# Patient Record
Sex: Male | Born: 1940
Health system: Southern US, Community
[De-identification: ages and names within clinical notes are randomized; demographics above are authoritative.]

## PROBLEM LIST (undated history)

## (undated) DIAGNOSIS — I10 Essential (primary) hypertension: Secondary | ICD-10-CM

## (undated) DIAGNOSIS — I4891 Unspecified atrial fibrillation: Secondary | ICD-10-CM

## (undated) DIAGNOSIS — E119 Type 2 diabetes mellitus without complications: Secondary | ICD-10-CM

## (undated) HISTORY — PX: KIDNEY STONE SURGERY: SHX686

## (undated) HISTORY — PX: HERNIA REPAIR: SHX51

---

## 2007-08-02 ENCOUNTER — Ambulatory Visit (HOSPITAL_BASED_OUTPATIENT_CLINIC_OR_DEPARTMENT_OTHER): Admission: RE | Admit: 2007-08-02 | Discharge: 2007-08-03 | Payer: Self-pay | Admitting: Orthopedic Surgery

## 2010-08-27 NOTE — Op Note (Signed)
Jason, [name] NO.:  [id number]   MEDICAL RECORD NO.:  [id number]          PATIENT TYPE:  AMB   LOCATION:  DSC                          FACILITY:  [facility]   PHYSICIAN:  Loreta Ave, M.D. DATE OF BIRTH:  1940-04-22   DATE OF PROCEDURE:  08/02/2007  DATE OF DISCHARGE:                               OPERATIVE REPORT   PREOPERATIVE DIAGNOSIS:  Displaced trimalleolar ankle fracture, right.   POSTOPERATIVE DIAGNOSES:  1. Displaced trimalleolar ankle fracture, right.  2. Disruption of syndesmosis distal tib-fib joint.   PROCEDURES:  1. Open reduction and internal fixation of trimalleolar ankle fracture      with plate fixation laterally with a Synthes locking plate.  2. Screw, anchor, and suture fixation medially.  3. Closed treatment of posterior fracture.  4. Repair of syndesmosis with TightRope device.   SURGEON:  Loreta Ave, MD   ASSISTANT:  Genene Churn. Barry Dienes, PA   ANESTHESIA:  General.   BLOOD LOSS:  Minimal.   TOURNIQUET TIME:  1 hour.   SPECIMENS:  None.   CONSULTS:  None.   COMPLICATIONS:  None.   DRESSINGS:  Sterile compressive with short leg splint.   PROCEDURE:  The patient brought to the operating room and placed on the  operating table in supine position.  After adequate anesthesia had been  obtained, tourniquet applied to the upper aspect of the right leg.  Leg  examined.  The fracture blisters much improved.  Under sterile  technique, these were drained with a sterile needle just to get the  remaining fluid out.  Ankle was then examined with fluoroscopic  technique.  Comparison view of the opposite side.  Obvious trimalleolar  fracture with small fractures medially.  Syndesmosis was more disrupted  than I anticipated based on a fibular fracture pattern.  After  establishing what needed to be done, prepped and draped in the usual  sterile fashion.  Exsanguinated with elevation and Esmarch.  Tourniquet  inflated to 350 mmHg.   Incision laterally.  Subperiosteal exposure of  the fibula.  Reduced anatomically.  Fixed with a 6-hole plate, and it  was placed posterolaterally.  One of the central screws above the  fracture was left open for a TightRope device for the distal tib-fib  repair.  Nice anatomic reduction of the lateral side.  Despite the  fibula being fixed, there was still some gapping at the distal tib-fib  joint, although improved from where it was before.  Attention turned  medially, where a longitudinal incision heading distal and posterior was  used.  Skin and subcutaneous tissue divided.  Most of the medial  malleolus was fragmented just to avulse off the base.  Some of these  excised, some of them left in place in the deltoid ligament, which was  planned to repair to the medial malleolus.  As the medial malleolus was  exposed, the whole front half was actually broken off in a relatively  large fragment.  It was not evident on preop films.  This was first  fixed anatomically with a guidewire and then with  a cannulated 4.0  screw.  I then put a bioabsorbable anchor up into the medial malleolus  and used a FiberWire to weave down to the deltoid ligament.  This was  then reduced and firmly tied, repairing the entire medial side.  On the  lateral side, through the central hole of the plate, a TightRope device  was inserted with pre-drilling a guidewire, then a drill, and then  passed in the TightRope across and out through the medial side.  This  was firmly anchored on both sides, tightened with the foot dorsiflexed,  and then firmly tied down.  When that was complete, I had anatomic  alignment with reduction of all fractures and the ankle and the distal  tib-fib joint.  Under fluoroscopy, I could go through good motion.  There was no movement of the posterior fracture, requiring open  treatment of that.  All wounds were irrigated.  Closed with Vicryl and  then staples.  We put large Adaptic and  Xeroform over the blister areas,  which were more anterior and away from the incisions.  Short leg splint  applied.  Tourniquet inflated and removed.  Anesthesia reversed.  Brought to recovery room.  Tolerated the surgery well.  No  complications.      Loreta Ave, M.D.  Electronically Signed     DFM/MEDQ  D:  08/02/2007  T:  08/03/2007  Job:  [id number]

## 2011-01-07 LAB — BASIC METABOLIC PANEL WITH GFR
BUN: 20
CO2: 29
Calcium: 9.1
Chloride: 100
Creatinine, Ser: 0.99
GFR calc non Af Amer: >60
Glucose, Bld: 133 — ABNORMAL HIGH
Potassium: 4.8
Sodium: 137

## 2011-11-18 DIAGNOSIS — Z23 Encounter for immunization: Secondary | ICD-10-CM | POA: Diagnosis not present

## 2011-11-18 DIAGNOSIS — Z125 Encounter for screening for malignant neoplasm of prostate: Secondary | ICD-10-CM | POA: Diagnosis not present

## 2011-11-18 DIAGNOSIS — I10 Essential (primary) hypertension: Secondary | ICD-10-CM | POA: Diagnosis not present

## 2012-08-05 DIAGNOSIS — J309 Allergic rhinitis, unspecified: Secondary | ICD-10-CM | POA: Diagnosis not present

## 2012-12-03 DIAGNOSIS — I10 Essential (primary) hypertension: Secondary | ICD-10-CM | POA: Diagnosis not present

## 2013-01-13 DIAGNOSIS — S058X9A Other injuries of unspecified eye and orbit, initial encounter: Secondary | ICD-10-CM | POA: Diagnosis not present

## 2013-01-16 DIAGNOSIS — S058X9A Other injuries of unspecified eye and orbit, initial encounter: Secondary | ICD-10-CM | POA: Diagnosis not present

## 2013-12-21 DIAGNOSIS — I10 Essential (primary) hypertension: Secondary | ICD-10-CM | POA: Diagnosis not present

## 2013-12-21 DIAGNOSIS — Z9181 History of falling: Secondary | ICD-10-CM | POA: Diagnosis not present

## 2013-12-21 DIAGNOSIS — Z1331 Encounter for screening for depression: Secondary | ICD-10-CM | POA: Diagnosis not present

## 2013-12-21 DIAGNOSIS — R7301 Impaired fasting glucose: Secondary | ICD-10-CM | POA: Diagnosis not present

## 2013-12-21 DIAGNOSIS — Z125 Encounter for screening for malignant neoplasm of prostate: Secondary | ICD-10-CM | POA: Diagnosis not present

## 2013-12-27 DIAGNOSIS — E119 Type 2 diabetes mellitus without complications: Secondary | ICD-10-CM | POA: Diagnosis not present

## 2014-03-29 DIAGNOSIS — Z23 Encounter for immunization: Secondary | ICD-10-CM | POA: Diagnosis not present

## 2014-03-29 DIAGNOSIS — E119 Type 2 diabetes mellitus without complications: Secondary | ICD-10-CM | POA: Diagnosis not present

## 2014-03-29 DIAGNOSIS — I4891 Unspecified atrial fibrillation: Secondary | ICD-10-CM | POA: Diagnosis not present

## 2014-03-29 DIAGNOSIS — I499 Cardiac arrhythmia, unspecified: Secondary | ICD-10-CM | POA: Diagnosis not present

## 2014-03-29 DIAGNOSIS — B351 Tinea unguium: Secondary | ICD-10-CM | POA: Diagnosis not present

## 2014-04-02 DIAGNOSIS — H1131 Conjunctival hemorrhage, right eye: Secondary | ICD-10-CM | POA: Diagnosis not present

## 2014-04-05 DIAGNOSIS — I10 Essential (primary) hypertension: Secondary | ICD-10-CM | POA: Diagnosis not present

## 2014-04-05 DIAGNOSIS — H1131 Conjunctival hemorrhage, right eye: Secondary | ICD-10-CM | POA: Diagnosis not present

## 2014-04-05 DIAGNOSIS — I4891 Unspecified atrial fibrillation: Secondary | ICD-10-CM | POA: Diagnosis not present

## 2014-04-18 DIAGNOSIS — I4891 Unspecified atrial fibrillation: Secondary | ICD-10-CM | POA: Diagnosis not present

## 2014-04-18 DIAGNOSIS — I10 Essential (primary) hypertension: Secondary | ICD-10-CM | POA: Diagnosis not present

## 2014-05-01 DIAGNOSIS — I10 Essential (primary) hypertension: Secondary | ICD-10-CM | POA: Diagnosis not present

## 2014-05-01 DIAGNOSIS — I482 Chronic atrial fibrillation: Secondary | ICD-10-CM | POA: Diagnosis not present

## 2014-05-01 DIAGNOSIS — R931 Abnormal findings on diagnostic imaging of heart and coronary circulation: Secondary | ICD-10-CM | POA: Diagnosis not present

## 2014-05-30 DIAGNOSIS — I480 Paroxysmal atrial fibrillation: Secondary | ICD-10-CM | POA: Diagnosis not present

## 2015-01-08 DIAGNOSIS — E119 Type 2 diabetes mellitus without complications: Secondary | ICD-10-CM | POA: Diagnosis not present

## 2015-01-08 DIAGNOSIS — J309 Allergic rhinitis, unspecified: Secondary | ICD-10-CM | POA: Diagnosis not present

## 2015-01-08 DIAGNOSIS — B351 Tinea unguium: Secondary | ICD-10-CM | POA: Diagnosis not present

## 2015-01-08 DIAGNOSIS — I10 Essential (primary) hypertension: Secondary | ICD-10-CM | POA: Diagnosis not present

## 2015-01-08 DIAGNOSIS — Z6830 Body mass index (BMI) 30.0-30.9, adult: Secondary | ICD-10-CM | POA: Diagnosis not present

## 2015-01-08 DIAGNOSIS — Z9181 History of falling: Secondary | ICD-10-CM | POA: Diagnosis not present

## 2015-02-28 DIAGNOSIS — H35033 Hypertensive retinopathy, bilateral: Secondary | ICD-10-CM | POA: Diagnosis not present

## 2015-02-28 DIAGNOSIS — H2513 Age-related nuclear cataract, bilateral: Secondary | ICD-10-CM | POA: Diagnosis not present

## 2015-04-04 DIAGNOSIS — I10 Essential (primary) hypertension: Secondary | ICD-10-CM | POA: Diagnosis not present

## 2015-04-04 DIAGNOSIS — Z0000 Encounter for general adult medical examination without abnormal findings: Secondary | ICD-10-CM | POA: Diagnosis not present

## 2015-04-04 DIAGNOSIS — E119 Type 2 diabetes mellitus without complications: Secondary | ICD-10-CM | POA: Diagnosis not present

## 2015-04-04 DIAGNOSIS — Z6831 Body mass index (BMI) 31.0-31.9, adult: Secondary | ICD-10-CM | POA: Diagnosis not present

## 2015-07-29 ENCOUNTER — Encounter (HOSPITAL_COMMUNITY): Payer: Self-pay | Admitting: Emergency Medicine

## 2015-07-29 ENCOUNTER — Emergency Department (HOSPITAL_COMMUNITY): Payer: Medicare Other

## 2015-07-29 ENCOUNTER — Inpatient Hospital Stay (HOSPITAL_COMMUNITY): Payer: Medicare Other

## 2015-07-29 ENCOUNTER — Inpatient Hospital Stay (HOSPITAL_COMMUNITY)
Admission: EM | Admit: 2015-07-29 | Discharge: 2015-08-02 | DRG: 292 | Disposition: A | Discharge status: Home Health | Payer: Medicare Other | Attending: Family Medicine | Admitting: Family Medicine

## 2015-07-29 DIAGNOSIS — I5023 Acute on chronic systolic (congestive) heart failure: Secondary | ICD-10-CM | POA: Diagnosis present

## 2015-07-29 DIAGNOSIS — I10 Essential (primary) hypertension: Secondary | ICD-10-CM

## 2015-07-29 DIAGNOSIS — E119 Type 2 diabetes mellitus without complications: Secondary | ICD-10-CM | POA: Diagnosis not present

## 2015-07-29 DIAGNOSIS — I509 Heart failure, unspecified: Secondary | ICD-10-CM

## 2015-07-29 DIAGNOSIS — I480 Paroxysmal atrial fibrillation: Secondary | ICD-10-CM | POA: Diagnosis present

## 2015-07-29 DIAGNOSIS — Z7982 Long term (current) use of aspirin: Secondary | ICD-10-CM

## 2015-07-29 DIAGNOSIS — I110 Hypertensive heart disease with heart failure: Secondary | ICD-10-CM | POA: Diagnosis not present

## 2015-07-29 DIAGNOSIS — R0902 Hypoxemia: Secondary | ICD-10-CM | POA: Diagnosis present

## 2015-07-29 DIAGNOSIS — Z87442 Personal history of urinary calculi: Secondary | ICD-10-CM

## 2015-07-29 DIAGNOSIS — R0600 Dyspnea, unspecified: Secondary | ICD-10-CM

## 2015-07-29 DIAGNOSIS — K7460 Unspecified cirrhosis of liver: Secondary | ICD-10-CM | POA: Diagnosis not present

## 2015-07-29 DIAGNOSIS — R069 Unspecified abnormalities of breathing: Secondary | ICD-10-CM | POA: Diagnosis not present

## 2015-07-29 DIAGNOSIS — R0603 Acute respiratory distress: Secondary | ICD-10-CM

## 2015-07-29 DIAGNOSIS — K7469 Other cirrhosis of liver: Secondary | ICD-10-CM | POA: Diagnosis not present

## 2015-07-29 DIAGNOSIS — Z79899 Other long term (current) drug therapy: Secondary | ICD-10-CM | POA: Diagnosis not present

## 2015-07-29 DIAGNOSIS — R0602 Shortness of breath: Secondary | ICD-10-CM | POA: Diagnosis not present

## 2015-07-29 DIAGNOSIS — I481 Persistent atrial fibrillation: Secondary | ICD-10-CM

## 2015-07-29 DIAGNOSIS — K8020 Calculus of gallbladder without cholecystitis without obstruction: Secondary | ICD-10-CM | POA: Diagnosis not present

## 2015-07-29 HISTORY — DX: Unspecified atrial fibrillation: I48.91

## 2015-07-29 HISTORY — DX: Essential (primary) hypertension: I10

## 2015-07-29 HISTORY — DX: Type 2 diabetes mellitus without complications: E11.9

## 2015-07-29 LAB — LIPID PANEL
CHOL/HDL RATIO: 4.3 ratio
CHOLESTEROL: 130 mg/dL (ref 0–200)
HDL: 30 mg/dL — AB (ref >40)
LDL Cholesterol: 88 mg/dL (ref 0–99)
TRIGLYCERIDES: 61 mg/dL (ref <150)
VLDL: 12 mg/dL (ref 0–40)

## 2015-07-29 LAB — MRSA PCR SCREENING: MRSA by PCR: NEGATIVE

## 2015-07-29 LAB — TROPONIN I: Troponin I: 0.04 ng/mL — ABNORMAL HIGH (ref <0.031)

## 2015-07-29 LAB — HEPATIC FUNCTION PANEL
ALBUMIN: 3.7 g/dL (ref 3.5–5.0)
ALT: 20 U/L (ref 17–63)
AST: 44 U/L — AB (ref 15–41)
Alkaline Phosphatase: 66 U/L (ref 38–126)
Bilirubin, Direct: 0.3 mg/dL (ref 0.1–0.5)
Indirect Bilirubin: 1.1 mg/dL — ABNORMAL HIGH (ref 0.3–0.9)
Total Bilirubin: 1.4 mg/dL — ABNORMAL HIGH (ref 0.3–1.2)
Total Protein: 6.6 g/dL (ref 6.5–8.1)

## 2015-07-29 LAB — I-STAT CHEM 8, ED
BUN: 33 mg/dL — AB (ref 6–20)
CHLORIDE: 107 mmol/L (ref 101–111)
CREATININE: 1 mg/dL (ref 0.61–1.24)
Calcium, Ion: 1.17 mmol/L (ref 1.13–1.30)
GLUCOSE: 162 mg/dL — AB (ref 65–99)
HCT: 54 % — ABNORMAL HIGH (ref 39.0–52.0)
Hemoglobin: 18.4 g/dL — ABNORMAL HIGH (ref 13.0–17.0)
POTASSIUM: 4.6 mmol/L (ref 3.5–5.1)
Sodium: 143 mmol/L (ref 135–145)
TCO2: 24 mmol/L (ref 0–100)

## 2015-07-29 LAB — BASIC METABOLIC PANEL
ANION GAP: 11 (ref 5–15)
BUN: 25 mg/dL — AB (ref 6–20)
CALCIUM: 9.6 mg/dL (ref 8.9–10.3)
CO2: 23 mmol/L (ref 22–32)
Chloride: 107 mmol/L (ref 101–111)
Creatinine, Ser: 1.08 mg/dL (ref 0.61–1.24)
GFR calc Af Amer: >60 mL/min (ref >60)
GLUCOSE: 173 mg/dL — AB (ref 65–99)
Potassium: 4.6 mmol/L (ref 3.5–5.1)
SODIUM: 141 mmol/L (ref 135–145)

## 2015-07-29 LAB — CBC
HCT: 52 % (ref 39.0–52.0)
Hemoglobin: 17.2 g/dL — ABNORMAL HIGH (ref 13.0–17.0)
MCH: 30.8 pg (ref 26.0–34.0)
MCHC: 33.1 g/dL (ref 30.0–36.0)
MCV: 93.2 fL (ref 78.0–100.0)
Platelets: 180 10*3/uL (ref 150–400)
RBC: 5.58 MIL/uL (ref 4.22–5.81)
RDW: 14.8 % (ref 11.5–15.5)
WBC: 9.4 10*3/uL (ref 4.0–10.5)

## 2015-07-29 LAB — TSH: TSH: 1.434 u[IU]/mL (ref 0.350–4.500)

## 2015-07-29 LAB — I-STAT TROPONIN, ED: TROPONIN I, POC: 0 ng/mL (ref 0.00–0.08)

## 2015-07-29 LAB — D-DIMER, QUANTITATIVE: D-Dimer, Quant: 0.62 ug/mL-FEU — ABNORMAL HIGH (ref 0.00–0.50)

## 2015-07-29 LAB — BRAIN NATRIURETIC PEPTIDE: B NATRIURETIC PEPTIDE 5: 360.6 pg/mL — AB (ref 0.0–100.0)

## 2015-07-29 LAB — PROTIME-INR
INR: 1.3 (ref 0.00–1.49)
Prothrombin Time: 16.4 seconds — ABNORMAL HIGH (ref 11.6–15.2)

## 2015-07-29 LAB — GLUCOSE, CAPILLARY
Glucose-Capillary: 151 mg/dL — ABNORMAL HIGH (ref 65–99)
Glucose-Capillary: 141 mg/dL — ABNORMAL HIGH (ref 65–99)

## 2015-07-29 LAB — CBG MONITORING, ED: Glucose-Capillary: 173 mg/dL — ABNORMAL HIGH (ref 65–99)

## 2015-07-29 MED ORDER — NIACIN ER 500 MG PO TBCR
500.0000 mg | EXTENDED_RELEASE_TABLET | Freq: Every day | ORAL | Status: DC
  Administered 2015-07-29: 500 mg via ORAL
  Filled 2015-07-29: qty 1

## 2015-07-29 MED ORDER — ASPIRIN EC 81 MG PO TBEC
81.0000 mg | DELAYED_RELEASE_TABLET | Freq: Every day | ORAL | Status: DC
  Administered 2015-07-30: 81 mg via ORAL
  Filled 2015-07-29: qty 1

## 2015-07-29 MED ORDER — SODIUM CHLORIDE 0.9 % IV BOLUS (SEPSIS)
500.0000 mL | Freq: Once | INTRAVENOUS | Status: AC
Start: 2015-07-29 — End: 2015-07-29
  Administered 2015-07-29: 500 mL via INTRAVENOUS

## 2015-07-29 MED ORDER — IOPAMIDOL (ISOVUE-370) INJECTION 76%
INTRAVENOUS | Status: AC
  Administered 2015-07-29: 90 mL
  Filled 2015-07-29: qty 100

## 2015-07-29 MED ORDER — LISINOPRIL 5 MG PO TABS
5.0000 mg | ORAL_TABLET | Freq: Every day | ORAL | Status: DC
  Administered 2015-07-30 – 2015-08-02 (×4): 5 mg via ORAL
  Filled 2015-07-29 (×4): qty 1

## 2015-07-29 MED ORDER — ACETAMINOPHEN 650 MG RE SUPP: 650.0000 mg | Freq: Four times a day (QID) | RECTAL | Status: DC | PRN

## 2015-07-29 MED ORDER — INSULIN ASPART 100 UNIT/ML ~~LOC~~ SOLN
0.0000 [IU] | Freq: Every day | SUBCUTANEOUS | Status: DC
  Administered 2015-07-30 – 2015-07-31 (×2): 2 [IU] via SUBCUTANEOUS

## 2015-07-29 MED ORDER — ENOXAPARIN SODIUM 40 MG/0.4ML ~~LOC~~ SOLN
40.0000 mg | SUBCUTANEOUS | Status: DC
  Administered 2015-07-29: 40 mg via SUBCUTANEOUS
  Filled 2015-07-29: qty 0.4

## 2015-07-29 MED ORDER — POLYETHYLENE GLYCOL 3350 17 G PO PACK: 17.0000 g | PACK | Freq: Every day | ORAL | Status: DC | PRN

## 2015-07-29 MED ORDER — ONDANSETRON HCL 4 MG/2ML IJ SOLN: 4.0000 mg | Freq: Three times a day (TID) | INTRAMUSCULAR | Status: AC | PRN

## 2015-07-29 MED ORDER — CARVEDILOL 6.25 MG PO TABS
6.2500 mg | ORAL_TABLET | Freq: Two times a day (BID) | ORAL | Status: DC
  Administered 2015-07-30 – 2015-08-01 (×5): 6.25 mg via ORAL
  Filled 2015-07-29 (×5): qty 1

## 2015-07-29 MED ORDER — SODIUM CHLORIDE 0.9% FLUSH
3.0000 mL | Freq: Two times a day (BID) | INTRAVENOUS | Status: DC
  Administered 2015-07-29 – 2015-08-01 (×7): 3 mL via INTRAVENOUS

## 2015-07-29 MED ORDER — ACETAMINOPHEN 325 MG PO TABS: 650.0000 mg | ORAL_TABLET | Freq: Four times a day (QID) | ORAL | Status: DC | PRN

## 2015-07-29 MED ORDER — INSULIN ASPART 100 UNIT/ML ~~LOC~~ SOLN
0.0000 [IU] | Freq: Three times a day (TID) | SUBCUTANEOUS | Status: DC
  Administered 2015-07-30 – 2015-07-31 (×6): 2 [IU] via SUBCUTANEOUS
  Administered 2015-08-01: 3 [IU] via SUBCUTANEOUS
  Administered 2015-08-01 – 2015-08-02 (×4): 2 [IU] via SUBCUTANEOUS

## 2015-07-29 MED ORDER — FUROSEMIDE 10 MG/ML IJ SOLN
40.0000 mg | Freq: Two times a day (BID) | INTRAMUSCULAR | Status: DC
  Administered 2015-07-30 – 2015-07-31 (×3): 40 mg via INTRAVENOUS
  Filled 2015-07-29 (×3): qty 4

## 2015-07-29 MED ORDER — ASPIRIN 81 MG PO CHEW
324.0000 mg | CHEWABLE_TABLET | Freq: Once | ORAL | Status: AC
Start: 2015-07-29 — End: 2015-07-29
  Administered 2015-07-29: 324 mg via ORAL
  Filled 2015-07-29: qty 4

## 2015-07-29 MED ORDER — FUROSEMIDE 10 MG/ML IJ SOLN
40.0000 mg | INTRAMUSCULAR | Status: AC
  Administered 2015-07-29: 40 mg via INTRAVENOUS
  Filled 2015-07-29: qty 4

## 2015-07-29 NOTE — ED Notes (Signed)
From home: walking to kitchen and had sudden onset of SOB and became very diaphoretic. Fire obtained sat of 90% on room air. Arrives to ED saturating 95% on 3L, still tachyapneic and feeling SOB. Diaphoretic on arrival. Denies any pain.

## 2015-07-29 NOTE — ED Notes (Signed)
Patient transported to CT 

## 2015-07-29 NOTE — ED Provider Notes (Signed)
CSN: [id number]     Arrival date & time 07/29/15  1304 History   First MD Initiated Contact with Patient 07/29/15 1325     Chief Complaint  Patient presents with  . Shortness of Breath     (Consider location/radiation/quality/duration/timing/severity/associated sxs/prior Treatment) HPI   Patient is a 75 year old male with history of diabetes, hypertension and A. fib, who presents to the emergency department after sudden onset of shortness of breath with wheeze and diaphoresis. He states that he was in his otherwise normal health and woke up from a nap just prior to arrival when he tried to walk to the kitchen. He noticed he was very short of breath and wheezy. He was unable to continue walking, he denies any syncope. EMS was called to his home and he was 90% on room air, improving to 95% on 3 L nasal cannula, continued to be tachypneic, diaphoretic, pale clammy and short of breath. He denies any chest pain or headache, leg pain, lower extremities edema, syncope.  He states that yesterday his legs felt "a little heavy" he denies any pain or weakness of his legs.  His wife states that yesterday they were working outside and with very minimal exertion or minimal tasks he became very short of breath.  He reports a history of A. fib and reports seeing a cardiologist 2 recently asked him to record his blood pressures 4 month. Chart review does not have any prior EKGs. He is not appeared to be on any medicines for A. fib. Currently he has diabetes and hypertension, takes 5 mg of lisinopril.  Past Medical History  Diagnosis Date  . Diabetes mellitus without complication (HCC)   . Hypertension   . Atrial fibrillation [facility](HCC)    Past Surgical History  Procedure Laterality Date  . Hernia repair    . Kidney stone surgery     History reviewed. No pertinent family history. Social History  Substance Use Topics  . Smoking status: Never Smoker   . Smokeless tobacco: None  . Alcohol Use: No    Review of  Systems  Unable to perform ROS: Severe respiratory distress  Constitutional: Positive for diaphoresis. Negative for fever and chills.  HENT: Negative.   Respiratory: Positive for shortness of breath and wheezing. Negative for chest tightness.   Cardiovascular: Negative for chest pain, palpitations and leg swelling.  Gastrointestinal: Negative.   Genitourinary: Negative.   Musculoskeletal: Negative.   Neurological: Negative.   Psychiatric/Behavioral: Negative.       Allergies  Review of patient's allergies indicates no known allergies.  Home Medications   Prior to Admission medications   Medication Sig Start Date End Date Taking? Authorizing Provider  aspirin 81 MG tablet Take 161 mg by mouth daily.   Yes Historical Provider, MD  atenolol (TENORMIN) 100 MG tablet Take 100 mg by mouth daily.   Yes Historical Provider, MD  co-enzyme Q-10 50 MG capsule Take 50 mg by mouth daily.   Yes Historical Provider, MD  lisinopril (PRINIVIL,ZESTRIL) 2.5 MG tablet Take 2.5 mg by mouth daily.   Yes Historical Provider, MD  lisinopril (PRINIVIL,ZESTRIL) 5 MG tablet Take 5 mg by mouth daily.   Yes Historical Provider, MD  Multiple Vitamins-Minerals (MULTIVITAMIN WITH MINERALS) tablet Take 1 tablet by mouth daily.   Yes Historical Provider, MD  Naproxen Sodium (ALEVE) 220 MG CAPS Take 220 mg by mouth every 8 (eight) hours as needed (pain).   Yes Historical Provider, MD  niacin (SLO-NIACIN) 500 MG tablet Take 500 mg  by mouth at bedtime.   Yes Historical Provider, MD  Omega-3 Fatty Acids (FISH OIL) 1000 MG CAPS Take 1,000 mg by mouth daily.   Yes Historical Provider, MD   BP 133/100 mmHg  Pulse 98  Resp 23  Wt 106.187 kg  SpO2 98% Physical Exam  Constitutional: He is oriented to person, place, and time. He appears well-developed and well-nourished. He appears distressed.  HENT:  Head: Normocephalic and atraumatic.  Nose: Nose normal.  Eyes: Conjunctivae and EOM are normal. Pupils are equal,  round, and reactive to light. Right eye exhibits no discharge. Left eye exhibits no discharge. No scleral icterus.  Neck: Normal range of motion.  Cardiovascular: An irregularly irregular rhythm present.  Pulses:      Radial pulses are 1+ on the right side, and 1+ on the left side.       Dorsalis pedis pulses are 1+ on the right side, and 1+ on the left side.  Pulmonary/Chest: No accessory muscle usage. Tachypnea noted. He is in respiratory distress. He has decreased breath sounds. He has no wheezes. He has rales.  Abdominal: Soft. Bowel sounds are normal. He exhibits no distension. There is no tenderness.  Neurological: He is alert and oriented to person, place, and time. He exhibits normal muscle tone. Coordination normal.  Skin: No rash noted. He is diaphoretic. There is cyanosis. No erythema. There is pallor. Nails show no clubbing.  Delayed capillary refill in all extremities  Psychiatric: He has a normal mood and affect. His behavior is normal.  Nursing note and vitals reviewed.   ED Course  Procedures (including critical care time) Labs Review Labs Reviewed  BASIC METABOLIC PANEL - Abnormal; Notable for the following:    Glucose, Bld 173 (*)    BUN 25 (*)    All other components within normal limits  CBC - Abnormal; Notable for the following:    Hemoglobin 17.2 (*)    All other components within normal limits  D-DIMER, QUANTITATIVE (NOT AT [facility]) - Abnormal; Notable for the following:    D-Dimer, Quant 0.62 (*)    All other components within normal limits  PROTIME-INR - Abnormal; Notable for the following:    Prothrombin Time 16.4 (*)    All other components within normal limits  BRAIN NATRIURETIC PEPTIDE - Abnormal; Notable for the following:    B Natriuretic Peptide 360.6 (*)    All other components within normal limits  CBG MONITORING, ED - Abnormal; Notable for the following:    Glucose-Capillary 173 (*)    All other components within normal limits  I-STAT CHEM 8, ED -  Abnormal; Notable for the following:    BUN 33 (*)    Glucose, Bld 162 (*)    Hemoglobin 18.4 (*)    HCT 54.0 (*)    All other components within normal limits  I-STAT TROPOININ, ED    Imaging Review Ct Angio Chest Pe W/cm &/or Wo Cm  07/29/2015  CLINICAL DATA:  Acute onset of shortness of breath, diaphoresis and tachypnea. Evaluate for pulmonary embolism. EXAM: CT ANGIOGRAPHY CHEST WITH CONTRAST TECHNIQUE: Multidetector CT imaging of the chest was performed using the standard protocol during bolus administration of intravenous contrast. Multiplanar CT image reconstructions and MIPs were obtained to evaluate the vascular anatomy. CONTRAST:  90 ml Isovue 370. COMPARISON:  Portable chest same date. FINDINGS: Mediastinum: The pulmonary arteries are incompletely opacified with contrast. There is heterogeneous mixing of contrast in the central pulmonary arteries and incomplete filling of the  subsegmental branches. No evidence of acute pulmonary embolism. There is reflux of contrast into the hepatic veins and IVC. There is no opacification of the systemic vessels. Moderate atherosclerosis of the aorta, great vessels and coronary arteries is noted. The heart is mildly enlarged. There is no pericardial effusion. There are prominent hilar lymph nodes bilaterally. No enlarged axillary or mediastinal lymph nodes are seen. The thyroid gland, trachea and esophagus demonstrate no significant findings. Lungs/Pleura: There are small bilateral pleural effusions.The lungs demonstrate emphysema, diffuse central airway thickening and probable dependent bibasilar atelectasis. There is no consolidation or suspicious pulmonary nodule. Upper abdomen: Mild contour irregularity of the liver suspicious for cirrhosis. The visualized adrenal glands appear unremarkable. Musculoskeletal/Chest wall: No chest wall lesion or acute osseous findings. Review of the MIP images confirms the above findings. IMPRESSION: 1. No evidence of acute  pulmonary embolism. Evaluation of the distal pulmonary arterial branches is limited. 2. Cardiomegaly with reflux of contrast into IVC and hepatic veins suggesting right heart failure. Associated bilateral pleural effusions. 3. Nonspecific bilateral hilar nodal prominence. 4. Mild emphysema with chronic central airway thickening. 5. Suspected hepatic cirrhosis Electronically Signed   By: Carey Bullocks M.D.   On: 07/29/2015 15:00   Dg Chest Portable 1 View  07/29/2015  CLINICAL DATA:  Shortness of breath EXAM: PORTABLE CHEST 1 VIEW COMPARISON:  None. FINDINGS: Slightly low lung volumes. Top-normal heart size. Normal mediastinal contour. No pneumothorax. No pleural effusion. Mild medial bibasilar lung opacities, favor atelectasis. No pulmonary edema. IMPRESSION: Slightly low lung volumes. Mild medial bibasilar lung opacities, favor atelectasis. Electronically Signed   By: Delbert Phenix M.D.   On: 07/29/2015 14:23   I have personally reviewed and evaluated these images and lab results as part of my medical decision-making.   EKG Interpretation   Date/Time:  Sunday July 29 2015 13:20:49 EDT Ventricular Rate:  95 PR Interval:    QRS Duration: 119 QT Interval:  393 QTC Calculation: 494 R Axis:   -35 Text Interpretation:  Atrial fibrillation Incomplete right bundle branch  block No significant change since last tracing Confirmed by RAY MD,  Duwayne Heck ([id number]) on 07/29/2015 3:29:42 PM      MDM   Pt arrived to ER via EMS with tachypnea, dyspnea, diaphoresis, hypoxia He denies pain, has vague complaint of bilateral leg pain yesterday 1:40 PM Presentation concerning for PE, ACS, flash pulmonary edema.  Labs, troponin, CXR, CTA, BNP ordered.  I considered ordering heparin, this was discussed with Dr. Rosalia Hammers, we will wait for results, as he currently looks more comfortable on 3L [place] and seated upright.   4:04 PM Pt was transported to CT w/o portable monitor, CT tech called to report that when laying  patient supine, he became severely dyspneic, turned "purple" and they requested permission to apply mask. Patient is able to tolerate the scan with high flow oxygen and nonrebreather.  Patient's workup is significant for A. fib rates in the 90s, initial troponin is negative, d-dimer is positive however CT angio had no evidence of acute PE, unable to evaluate distal pulmonary artery branches. CT of the chest is also pertinent for cardiomegaly, contrast reflux into IVC and hepatic veins suggesting right heart strain or right heart failure. There were bilateral pleural effusions, nonspecific bilateral hilar node prominence, mild emphysema and mention of suspected hepatic cirrhosis.  BNP was 360.    Pt will be admitted to Step-down for respiratory distress, will need further workup for CHF.  IV lasix given.  I spoke with Dr.  Bacigalupo, Attending Dr. Leveda Anna to admit.  Final diagnoses:  Respiratory distress       Danelle Berry, PA-C 07/29/15 1616  Margarita Grizzle, MD 07/31/15 [phone]

## 2015-07-29 NOTE — H&P (Signed)
Family Medicine Teaching [facility] Hospital Admission History and Physical Service Pager: [phone]  Patient name: Jason [name] Medical record number: [id number] Date of birth: 02-Jul-1940 Age: 75 y.o. Gender: male  Primary Care Provider: No primary care provider on file. - [place] Consult[place]: none Code Status: full code - per discussion on admission  Chief Complaint: dyspnea  Assessment and Plan: Jason [name] is a 75 y.o. male presenting with dyspnea. PMH is significant for T2DM, HTN, a fib.  Acute Respiratory distress: Improving. Breathing comfortably on [place] (not on O2 at home). Suspect new onset CHF. BNP elevated to 360.6 (no baseline) and reports of PND. B/l pleural effusions and pulm congestion on chest imaging and crackles on lung exam.  No evidence of PE on CTA, despite elevated D dimer.  This could also be anginal equivalent given onset with exertion and resolution with rest.  Initial troponin neg and EKG nonischemic.  No smoking history or h/o COPD.  s/p aspirin 325mg  in ED, s/p IV Lasix 40mg  x1 in ED after 500cc NS bolus.  - admit to tele, FPTS, attending [place] - cycle troponins, repeat EKG in AM - O2 prn, continuous pulse ox - echo ordered  - lasix IV 40mg  BID - strict Is&Os, daily weights - risk strat labs: TSH, A1c, lipids - consult cardiology pending results of Echo  T2DM: Last A1c unavailable. Patient takes Metformin (unsure of dose) at home - A1c - hold home metformin in setting of contrast load - mod SSI with HS coverage  A fib: CHADsVasc 4.  Patient is adamant that he will not take Xarelto and has refused anticoagulation since diagnosis 2 years ago - tele monitoring - ongoing discuss about possible anti-coag options - d/c atenolol and start coreg 6.25mg  BID for rate control - continue home niacin  HTN: BP well controlled on admission - continue home lisinopril 5mg  daily  Possible Cirrhosis on CTA chest: incidentally noted.  Patient  without history of alcohol abuse. Will evaluate further. - RUQ US to evaluate further - add-on LFTs - consider testing for HBV and HCV to evaluate for possible etiologies  FEN/GI: heart/carb mod diet, SLIV Prophylaxis: lovenox  Disposition: admit to tele, dispo pending workup for dyspnea  History of Present Illness: Jason [name] is a 75 y.o. male presenting with dyspnea.  Patient reports acute onset of dyspnea this AM when putting on his shoes.  He then became diaphoretic and nauseous.  This episode lasted for 5-10 minutes and improved with rest.  His wife called EMS and he was able to walk to stretcher after they arrived.  He denies any chest pain or previous episodes of this.  No history of VTE, ACS, CVA, CAD.  He has been having PND x2 months, but denies orthopnea (sleeps on one pillow).  He was hypoxic (per report) on arrival to ED, but this improved with 3L [place]. While attempting to get CTA chest, patient became acutely dyspneic and hypoxic while laying flat.  Required NRB to ease hypoxia and increased WOB.  Patient reports that when he was initialyl diagnosed with a fib ~2 yrs ago, [place]  He got an Echo but was never told the results or started on any medications.  Review Of Systems: Per HPI with the following additions: none, as above Otherwise 12 point review of systems was performed and was unremarkable.  Patient Active Problem List   Diagnosis Date Noted  . Respiratory distress 07/29/2015  Past Medical History: Past Medical History  Diagnosis Date  . Diabetes mellitus without complication (HCC)   . Hypertension   . Atrial fibrillation [facility])    Past Surgical History: Past Surgical History  Procedure Laterality Date  . Hernia repair    . Kidney stone surgery     Social History: Social History  Substance Use Topics  . Smoking status: Never Smoker   . Smokeless tobacco: None  . Alcohol Use: No   Additional social history: none, denies  tobacco/etoh/illicit drugs  Please also refer to relevant sections of EMR.  Family History: History reviewed. No pertinent family history. Allergies and Medications: No Known Allergies No current facility-administered medications on file prior to encounter.   No current outpatient prescriptions on file prior to encounter.    Objective: BP 124/84 mmHg  Pulse 90  Resp 21  Wt 234 lb 1.6 oz (106.187 kg)  SpO2 98% Exam: General: elderly male, propped up in bed, NAD HEENT: NCAT, MMM, PERRL, EOMI, OP clear Neck: Supple, no LAD or thyromegaly Cardiovascular: Irreg irreg, no m/r/g, intact DP pulses Respiratory: Normal WOB, speaks in full sentences, [place] in place, diffuse crackles and diminished breath sounds in b/l bases  Abdomen: Soft, NTND, +BS, no HSM Extremities: WWP, no edema Skin: no rashes noted Neuro: A&O, no gross deficits Psych: euthymic, appropriate affect, no evidence of AVH  Labs and Imaging: CBC BMET   Recent Labs Lab 07/29/15 1340 07/29/15 1348  WBC 9.4  --   HGB 17.2* 18.4*  HCT 52.0 54.0*  PLT 180  --     Recent Labs Lab 07/29/15 1340 07/29/15 1348  NA 141 143  K 4.6 4.6  CL 107 107  CO2 23  --   BUN 25* 33*  CREATININE 1.08 1.00  GLUCOSE 173* 162*  CALCIUM 9.6  --       Troponin 0.00  D dimer 0.62  INR 1.3  BNP 360.6  EKG a fib with incomplete RBBB, no prior available for comparison   Ct Angio Chest Pe W/cm &/or Wo Cm  07/29/2015  CLINICAL DATA:  Acute onset of shortness of breath, diaphoresis and tachypnea. Evaluate for pulmonary embolism. EXAM: CT ANGIOGRAPHY CHEST WITH CONTRAST TECHNIQUE: Multidetector CT imaging of the chest was performed using the standard protocol during bolus administration of intravenous contrast. Multiplanar CT image reconstructions and MIPs were obtained to evaluate the vascular anatomy. CONTRAST:  90 ml Isovue 370. COMPARISON:  Portable chest same date. FINDINGS: Mediastinum: The pulmonary arteries are  incompletely opacified with contrast. There is heterogeneous mixing of contrast in the central pulmonary arteries and incomplete filling of the subsegmental branches. No evidence of acute pulmonary embolism. There is reflux of contrast into the hepatic veins and IVC. There is no opacification of the systemic vessels. Moderate atherosclerosis of the aorta, great vessels and coronary arteries is noted. The heart is mildly enlarged. There is no pericardial effusion. There are prominent hilar lymph nodes bilaterally. No enlarged axillary or mediastinal lymph nodes are seen. The thyroid gland, trachea and esophagus demonstrate no significant findings. Lungs/Pleura: There are small bilateral pleural effusions.The lungs demonstrate emphysema, diffuse central airway thickening and probable dependent bibasilar atelectasis. There is no consolidation or suspicious pulmonary nodule. Upper abdomen: Mild contour irregularity of the liver suspicious for cirrhosis. The visualized adrenal glands appear unremarkable. Musculoskeletal/Chest wall: No chest wall lesion or acute osseous findings. Review of the MIP images confirms the above findings. IMPRESSION: 1. No evidence of acute pulmonary embolism. Evaluation of the distal  pulmonary arterial branches is limited. 2. Cardiomegaly with reflux of contrast into IVC and hepatic veins suggesting right heart failure. Associated bilateral pleural effusions. 3. Nonspecific bilateral hilar nodal prominence. 4. Mild emphysema with chronic central airway thickening. 5. Suspected hepatic cirrhosis Electronically Signed   By: Carey Bullocks M.D.   On: 07/29/2015 15:00   Dg Chest Portable 1 View  07/29/2015  CLINICAL DATA:  Shortness of breath EXAM: PORTABLE CHEST 1 VIEW COMPARISON:  None. FINDINGS: Slightly low lung volumes. Top-normal heart size. Normal mediastinal contour. No pneumothorax. No pleural effusion. Mild medial bibasilar lung opacities, favor atelectasis. No pulmonary edema.  IMPRESSION: Slightly low lung volumes. Mild medial bibasilar lung opacities, favor atelectasis. Electronically Signed   By: Delbert Phenix M.D.   On: 07/29/2015 14:23    Erasmo Downer, MD 07/29/2015, 4:22 PM PGY-2,  Family Medicine FPTS Intern pager: [phone], text pages welcome

## 2015-07-30 ENCOUNTER — Inpatient Hospital Stay (HOSPITAL_COMMUNITY): Payer: Medicare Other

## 2015-07-30 DIAGNOSIS — K7460 Unspecified cirrhosis of liver: Secondary | ICD-10-CM | POA: Insufficient documentation

## 2015-07-30 DIAGNOSIS — I509 Heart failure, unspecified: Secondary | ICD-10-CM | POA: Insufficient documentation

## 2015-07-30 LAB — CBC
HEMATOCRIT: 48.7 % (ref 39.0–52.0)
Hemoglobin: 16.4 g/dL (ref 13.0–17.0)
MCH: 31.4 pg (ref 26.0–34.0)
MCHC: 33.7 g/dL (ref 30.0–36.0)
MCV: 93.1 fL (ref 78.0–100.0)
Platelets: 177 10*3/uL (ref 150–400)
RBC: 5.23 MIL/uL (ref 4.22–5.81)
RDW: 15 % (ref 11.5–15.5)
WBC: 9.2 10*3/uL (ref 4.0–10.5)

## 2015-07-30 LAB — GLUCOSE, CAPILLARY
GLUCOSE-CAPILLARY: 124 mg/dL — AB (ref 65–99)
GLUCOSE-CAPILLARY: 123 mg/dL — AB (ref 65–99)
GLUCOSE-CAPILLARY: 211 mg/dL — AB (ref 65–99)
Glucose-Capillary: 129 mg/dL — ABNORMAL HIGH (ref 65–99)

## 2015-07-30 LAB — TROPONIN I
TROPONIN I: 0.04 ng/mL — AB (ref <0.031)
Troponin I: 0.03 ng/mL (ref <0.031)

## 2015-07-30 LAB — BASIC METABOLIC PANEL
Anion gap: 10 (ref 5–15)
BUN: 24 mg/dL — AB (ref 6–20)
CO2: 28 mmol/L (ref 22–32)
CREATININE: 1.1 mg/dL (ref 0.61–1.24)
Calcium: 8.9 mg/dL (ref 8.9–10.3)
Chloride: 104 mmol/L (ref 101–111)
GFR calc non Af Amer: >60 mL/min (ref >60)
Glucose, Bld: 129 mg/dL — ABNORMAL HIGH (ref 65–99)
POTASSIUM: 4.2 mmol/L (ref 3.5–5.1)
SODIUM: 142 mmol/L (ref 135–145)

## 2015-07-30 MED ORDER — APIXABAN 5 MG PO TABS
5.0000 mg | ORAL_TABLET | Freq: Two times a day (BID) | ORAL | Status: DC
  Administered 2015-07-30 – 2015-08-02 (×7): 5 mg via ORAL
  Filled 2015-07-30 (×7): qty 1

## 2015-07-30 MED ORDER — ATORVASTATIN CALCIUM 40 MG PO TABS
40.0000 mg | ORAL_TABLET | Freq: Every day | ORAL | Status: DC
  Administered 2015-07-30 – 2015-08-01 (×3): 40 mg via ORAL
  Filled 2015-07-30 (×3): qty 1

## 2015-07-30 MED ORDER — FUROSEMIDE 40 MG PO TABS: 40.0000 mg | ORAL_TABLET | Freq: Every day | ORAL | Status: DC

## 2015-07-30 NOTE — Progress Notes (Signed)
Family Medicine Teaching Service Daily Progress Note Intern Pager: [phone]  Patient name: Jason [name] Paules Medical record number: [id number] Date of birth: 06/04/1940 Age: 75 y.o. Gender: male  Primary Care Provider: No primary care provider on file. Consultants: none Code Status: full  Pt Overview and Major Events to Date:  4/16 admitted with dyspnea  Assessment and Plan: Jason [name] Aikens is a 75 y.o. male presenting with dyspnea. PMH is significant for T2DM, HTN, a fib.  Acute Respiratory distress: improved. Euvolumic on exam. Suspect new onset CHF. Crackles on lung exam.BNP elevated to 360.6 (no baseline) and reports of PND. B/l pleural effusions and pulm congestion on CXR. Not on O2 at home. D-dimer elevated but normal when adjusted for age. No evidence of PE on CTA. This could also be anginal equivalent given onset with exertion and resolution with rest. Troponin 0.04>0.03>0.04 and EKG with Afib and RBBB.TSH 1.4. [name]/p aspirin 325mg  in ED, [name]/p IV Lasix 40mg  x2 & after 500cc NS bolus.  - O2 prn, continuous pulse ox - f/u echo - Switch to by mouth lasix 40 mg daily - strict Is&Os 3L/-2.7/-2.7 - Weight: 226>223 - f/u A1c - consult cardiology pending results of Echo - ASVD risk 39%. D/c Niacin. Started atorvastatin and ASA  T2DM: Last A1c unavailable. CBG 170-140. Patient takes Metformin (unsure of dose) at home - f/u A1c - hold home metformin in setting of contrast load - mod SSI with HS coverage  A fib: CHADsVasc 4 (5 if HF). HAS BLED 4.1%. Discussed this risks of stroke and bleeding with patient. He is willing to take Eliqius. - tele - Will start Eliqius 5 mg twice a day. - Stopped Lovonex - CM for medication assistance - D/cd atenolol and started coreg 6.25mg  BID for rate control  HTN: BP well controlled on admission - continue home lisinopril 5mg  daily  Possible Cirrhosis on CTA chest: incidentally noted. Patient without history of alcohol abuse. AST 44 and total bili  mildly elevated. - RUQ US: cholelithiasis is noted with mild gallbladder wall thickening, but no pericholecystic fluid or sonographic murphy'[name] sign is noted. Also heterogeneous echogenicity  - HBV, HCV and HIV  FEN/GI: heart/carb mod diet, SLIV  Prophylaxis: lovenox  Disposition: transfer to floor with tele  Subjective:  No shortness of breath, chest pain or abdominal pain. Hasn't been voiding a lot. Willing to take Xarelto.  Objective: Temp:  [97.3 F (36.3 C)] 97.3 F (36.3 C) (04/16 2000) Pulse Rate:  [53-128] 70 (04/17 0500) Resp:  [14-33] 20 (04/17 0400) BP: (95-141)/(60-110) 109/64 mmHg (04/17 0500) SpO2:  [90 %-100 %] 100 % (04/17 0500) Weight:  [223 lb 1.7 oz (101.2 kg)-234 lb 1.6 oz (106.187 kg)] 223 lb 1.7 oz (101.2 kg) (04/17 0500) Physical Exam: General: sitting in bed with [place] in place, no distress HEENT: NCAT, MMM, PERRL, EOMI, OP clear Cardiovascular: Irreg irreg, no m/r/g, intact DP pulses, no edema in legs Respiratory: Normal WOB, speaks in full sentences, CTAB Abdomen: Soft, NTND, +BS Extremities: warm and dry Skin: no rashes noted Neuro: A&O, no gross deficits  Laboratory:  Recent Labs Lab 07/29/15 1340 07/29/15 1348  WBC 9.4  --   HGB 17.2* 18.4*  HCT 52.0 54.0*  PLT 180  --     Recent Labs Lab 07/29/15 1340 07/29/15 1348 07/29/15 1856  NA 141 143  --   K 4.6 4.6  --   CL 107 107  --   CO2 23  --   --   BUN  25* 33*  --   CREATININE 1.08 1.00  --   CALCIUM 9.6  --   --   PROT  --   --  6.6  BILITOT  --   --  1.4*  ALKPHOS  --   --  66  ALT  --   --  20  AST  --   --  44*  GLUCOSE 173* 162*  --     Imaging/Diagnostic Tests: Ct Angio Chest Pe W/cm &/or Wo Cm  07/29/2015  CLINICAL DATA:  Acute onset of shortness of breath, diaphoresis and tachypnea. Evaluate for pulmonary embolism. EXAM: CT ANGIOGRAPHY CHEST WITH CONTRAST TECHNIQUE: Multidetector CT imaging of the chest was performed using the standard protocol during bolus  administration of intravenous contrast. Multiplanar CT image reconstructions and MIPs were obtained to evaluate the vascular anatomy. CONTRAST:  90 ml Isovue 370. COMPARISON:  Portable chest same date. FINDINGS: Mediastinum: The pulmonary arteries are incompletely opacified with contrast. There is heterogeneous mixing of contrast in the central pulmonary arteries and incomplete filling of the subsegmental branches. No evidence of acute pulmonary embolism. There is reflux of contrast into the hepatic veins and IVC. There is no opacification of the systemic vessels. Moderate atherosclerosis of the aorta, great vessels and coronary arteries is noted. The heart is mildly enlarged. There is no pericardial effusion. There are prominent hilar lymph nodes bilaterally. No enlarged axillary or mediastinal lymph nodes are seen. The thyroid gland, trachea and esophagus demonstrate no significant findings. Lungs/Pleura: There are small bilateral pleural effusions.The lungs demonstrate emphysema, diffuse central airway thickening and probable dependent bibasilar atelectasis. There is no consolidation or suspicious pulmonary nodule. Upper abdomen: Mild contour irregularity of the liver suspicious for cirrhosis. The visualized adrenal glands appear unremarkable. Musculoskeletal/Chest wall: No chest wall lesion or acute osseous findings. Review of the MIP images confirms the above findings. IMPRESSION: 1. No evidence of acute pulmonary embolism. Evaluation of the distal pulmonary arterial branches is limited. 2. Cardiomegaly with reflux of contrast into IVC and hepatic veins suggesting right heart failure. Associated bilateral pleural effusions. 3. Nonspecific bilateral hilar nodal prominence. 4. Mild emphysema with chronic central airway thickening. 5. Suspected hepatic cirrhosis Electronically Signed   By: Carey Bullocks M.D.   On: 07/29/2015 15:00   Dg Chest Portable 1 View  07/29/2015  CLINICAL DATA:  Shortness of breath  EXAM: PORTABLE CHEST 1 VIEW COMPARISON:  None. FINDINGS: Slightly low lung volumes. Top-normal heart size. Normal mediastinal contour. No pneumothorax. No pleural effusion. Mild medial bibasilar lung opacities, favor atelectasis. No pulmonary edema. IMPRESSION: Slightly low lung volumes. Mild medial bibasilar lung opacities, favor atelectasis. Electronically Signed   By: Delbert Phenix M.D.   On: 07/29/2015 14:23   US Abdomen Limited Ruq  07/30/2015  CLINICAL DATA:  Hepatic cirrhosis. EXAM: US ABDOMEN LIMITED - RIGHT UPPER QUADRANT COMPARISON:  CT scan of July 29, 2015. FINDINGS: Gallbladder: Multiple gallstones are noted with the largest measuring 18 mm. Mild gallbladder wall thickening is noted measured at 4.2 mm. No definite pericholecystic fluid or sonographic Murphy'[name] sign is noted. Common bile duct: Diameter: 4.7 mm which is within normal limits. Liver: Hepatic parenchyma is heterogeneous in appearance, although definite nodular contours are not visualized on this study. These findings may represent fatty infiltration or other diffuse hepatocellular disease. IMPRESSION: Cholelithiasis is noted with mild gallbladder wall thickening, but no pericholecystic fluid or sonographic Murphy'[name] sign is noted. If there is clinical concern for cholecystitis, HIDA scan may be performed for further evaluation.  Heterogeneous echogenicity of hepatic parenchyma is noted without definite nodular contours. This may represent fatty infiltration or other diffuse hepatocellular disease. Electronically Signed   By: Lupita Raider, M.D.   On: 07/30/2015 08:59    Almon Hercules, MD 07/30/2015, 6:40 AM PGY-1, [facility] FPTS Intern pager: [phone], text pages welcome

## 2015-07-30 NOTE — Care Management Note (Signed)
Case Management Note  Patient Details  Name: Jason [name] MRN: [id number] Date of Birth: 06-25-1940  Subjective/Objective:         Adm w resp distress           Action/Plan: lives w wife   Expected Discharge Date:                Expected Discharge Plan:  Home/Self Care  In-House Referral:     Discharge planning Services  CM Consult, Medication Assistance  Post Acute Care Choice:    Choice offered to:     DME Arranged:    DME Agency:     HH Arranged:    HH Agency:     Status of Service:     Medicare Important Message Given:    Date Medicare IM Given:    Medicare IM give by:    Date Additional Medicare IM Given:    Additional Medicare Important Message give by:     If discussed at Long Length of Stay Meetings, dates discussed:    Additional Comments: gave pt 30day free eliquis card and pt assist form. Pt states does not have medicare d plan for meds. Gave pt shipp inform to see if can get assist w meds thru [facility] program  Hanley HaysDowell,  T, RN 07/30/2015, 11:19 AM

## 2015-07-30 NOTE — Progress Notes (Signed)
Report called to [address].

## 2015-07-31 ENCOUNTER — Inpatient Hospital Stay (HOSPITAL_COMMUNITY): Payer: Medicare Other

## 2015-07-31 DIAGNOSIS — I5023 Acute on chronic systolic (congestive) heart failure: Secondary | ICD-10-CM

## 2015-07-31 DIAGNOSIS — R0600 Dyspnea, unspecified: Secondary | ICD-10-CM

## 2015-07-31 DIAGNOSIS — E119 Type 2 diabetes mellitus without complications: Secondary | ICD-10-CM

## 2015-07-31 LAB — HEPATITIS PANEL, ACUTE
Hep A IgM: NEGATIVE
Hep B C IgM: NEGATIVE
Hepatitis B Surface Ag: NEGATIVE

## 2015-07-31 LAB — HEMOGLOBIN A1C
HEMOGLOBIN A1C: 7.2 % — AB (ref 4.8–5.6)
MEAN PLASMA GLUCOSE: 160 mg/dL

## 2015-07-31 LAB — GLUCOSE, CAPILLARY
GLUCOSE-CAPILLARY: 135 mg/dL — AB (ref 65–99)
GLUCOSE-CAPILLARY: 148 mg/dL — AB (ref 65–99)
Glucose-Capillary: 150 mg/dL — ABNORMAL HIGH (ref 65–99)
Glucose-Capillary: 214 mg/dL — ABNORMAL HIGH (ref 65–99)

## 2015-07-31 LAB — ECHOCARDIOGRAM COMPLETE
Height: 71 in
Weight: 3520 oz

## 2015-07-31 LAB — HIV ANTIBODY (ROUTINE TESTING W REFLEX): HIV SCREEN 4TH GENERATION: NONREACTIVE

## 2015-07-31 MED ORDER — FUROSEMIDE 40 MG PO TABS
40.0000 mg | ORAL_TABLET | Freq: Every day | ORAL | Status: DC
  Administered 2015-08-01 – 2015-08-02 (×2): 40 mg via ORAL
  Filled 2015-07-31 (×2): qty 1

## 2015-07-31 NOTE — Progress Notes (Signed)
  Echocardiogram 2D Echocardiogram has been performed.  ,  07/31/2015, 2:30 PM

## 2015-07-31 NOTE — Progress Notes (Signed)
Family Medicine Teaching Service Daily Progress Note Intern Pager: [phone]  Patient name: Jason [name] Medical record number: [id number] Date of birth: [birth year] Age: 75 y.o. Gender: male  Primary Care Provider: No primary care provider on file. Consultants: none Code Status: full  Pt Overview and Major Events to Date:  4/16 admitted with dyspnea  Assessment and Plan: CRIS TALAVERA is a 75 y.o. male presenting with dyspnea. PMH is significant for T2DM, HTN, a fib.  Acute Respiratory distress: improved. Euvolumic on exam. Suspect new onset CHF. Crackles on lung exam.BNP elevated to 360.6 (no baseline) and reports of PND. B/l pleural effusions and pulm congestion on CXR. Not on O2 at home. D-dimer elevated but normal when adjusted for age. No evidence of PE on CTA. This could also be anginal equivalent given onset with exertion and resolution with rest. Troponin 0.04>0.03>0.04 and EKG with Afib and RBBB.TSH 1.4. S/p aspirin  in ED, s/p IV Lasix  x2 & after 500cc NS bolus.  - O2 prn, continuous pulse ox; currently on RA  -echo pending  -continue PO lasix 40 mg daily - strict Is&Os 3L/-2.7/-2.7/-3.2 - Weight: 226>223>220 - consult cardiology pending results of Echo - ASVD risk 39%. D/c Niacin. Started atorvastatin and ASA  T2DM: Last A1c unavailable. CBG 170-140. Patient takes Metformin (unsure of dose) at home - f/u A1c: 7.2 - hold home metformin in setting of contrast load - mod SSI with HS coverage  A fib: CHADsVasc 4 (5 if HF). HAS BLED 4.1%. Discussed this risks of stroke and bleeding with patient. He is willing to take Eliqius. - tele - Will start Eliqius 5 mg twice a day. - Stopped Lovonex - CM for medication assistance - D/cd atenolol and started coreg 6.25mg  BID for rate control  HTN: BP well controlled on admission - continue home lisinopril  daily  Possible Cirrhosis on CTA chest: incidentally noted. Patient without history of alcohol abuse. AST 44  and total bili mildly elevated. - RUQ [place]: cholelithiasis is noted with mild gallbladder wall thickening, but no pericholecystic fluid or sonographic murphy's sign is noted. Also heterogeneous echogenicity  - hepatitis panel and HIV negative   FEN/GI: heart/carb mod diet, SLIV  Prophylaxis: Eliquis   Disposition: transfer to floor with tele  Subjective:  No shortness of breath, chest pain or abdominal pain. Has been voiding a lot with lasix.   Objective: Temp:  [97.5 F (36.4 C)-97.7 F (36.5 C)] 97.7 F (36.5 C) (04/18 0428) Pulse Rate:  [46-107] 86 (04/18 0428) Resp:  [16-20] 18 (04/18 0428) BP: (89-135)/(73-88) 135/74 mmHg (04/18 0428) SpO2:  [95 %-98 %] 98 % (04/18 0428) Weight:  [220 lb (99.791 kg)-222 lb 6.4 oz (100.88 kg)] 220 lb (99.791 kg) (04/18 0428) Physical Exam: General: sitting in bed with [place] in place, no distress HEENT: NCAT, MMM, PERRL, EOMI, OP clear Cardiovascular: Irreg irreg, no m/r/g, intact DP pulses, no edema in legs Respiratory: Normal WOB, speaks in full sentences, CTAB Abdomen: Soft, NTND, +BS Extremities: warm and dry Skin: no rashes noted Neuro: A&O, no gross deficits  Laboratory:  Recent Labs Lab 07/29/15 1340 07/29/15 1348 07/30/15 0649  WBC 9.4  --  9.2  HGB 17.2* 18.4* 16.4  HCT 52.0 54.0* 48.7  PLT 180  --  177    Recent Labs Lab 07/29/15 1340 07/29/15 1348 07/29/15 1856 07/30/15 0649  NA 141 143  --  142  K 4.6 4.6  --  4.2  CL 107 107  --  104  CO2 23  --   --  28  BUN 25* 33*  --  24*  CREATININE 1.08 1.00  --  1.10  CALCIUM 9.6  --   --  8.9  PROT  --   --  6.6  --   BILITOT  --   --  1.4*  --   ALKPHOS  --   --  66  --   ALT  --   --  20  --   AST  --   --  44*  --   GLUCOSE 173* 162*  --  129*    Imaging/Diagnostic Tests: No results found.  Arvilla Marketatherine Lauren , DO 07/31/2015, 7:49 AM PGY-1, [facility] FPTS Intern pager: [phone], text pages welcome

## 2015-07-31 NOTE — Research (Signed)
REDS_0  Informed Consent   Subject Name: Jason [name]  Subject met inclusion and exclusion criteria.  The informed consent form, study requirements and expectations were reviewed with the subject and questions and concerns were addressed prior to the signing of the consent form.  The subject verbalized understanding of the trail requirements.  The subject agreed to participate in the REDS_1  trial and signed the informed consent.  The informed consent was obtained prior to performance of any protocol-specific procedures for the subject.  A copy of the signed informed consent was given to the subject and a copy was placed in the subject's medical record.  Sandie Ano 07/31/2015, 4:35 PM

## 2015-08-01 LAB — BASIC METABOLIC PANEL
ANION GAP: 9 (ref 5–15)
BUN: 24 mg/dL — ABNORMAL HIGH (ref 6–20)
CO2: 28 mmol/L (ref 22–32)
Calcium: 9.4 mg/dL (ref 8.9–10.3)
Chloride: 102 mmol/L (ref 101–111)
Creatinine, Ser: 1.14 mg/dL (ref 0.61–1.24)
GFR calc Af Amer: >60 mL/min (ref >60)
Glucose, Bld: 245 mg/dL — ABNORMAL HIGH (ref 65–99)
POTASSIUM: 4.2 mmol/L (ref 3.5–5.1)
SODIUM: 139 mmol/L (ref 135–145)

## 2015-08-01 LAB — GLUCOSE, CAPILLARY
GLUCOSE-CAPILLARY: 142 mg/dL — AB (ref 65–99)
GLUCOSE-CAPILLARY: 159 mg/dL — AB (ref 65–99)
GLUCOSE-CAPILLARY: 147 mg/dL — AB (ref 65–99)
Glucose-Capillary: 149 mg/dL — ABNORMAL HIGH (ref 65–99)

## 2015-08-01 MED ORDER — AMIODARONE HCL 200 MG PO TABS
200.0000 mg | ORAL_TABLET | Freq: Two times a day (BID) | ORAL | Status: DC
  Administered 2015-08-01 – 2015-08-02 (×3): 200 mg via ORAL
  Filled 2015-08-01 (×3): qty 1

## 2015-08-01 MED ORDER — CARVEDILOL 12.5 MG PO TABS
12.5000 mg | ORAL_TABLET | Freq: Two times a day (BID) | ORAL | Status: DC
  Administered 2015-08-01 – 2015-08-02 (×2): 12.5 mg via ORAL
  Filled 2015-08-01 (×2): qty 1

## 2015-08-01 NOTE — Consult Note (Addendum)
CARDIOLOGY CONSULT NOTE   Patient ID: HAI GRABE MRN: [id number] DOB/AGE: [birth year] 75 y.o.  Admit date: 07/29/2015  Primary Physician   No primary care provider on file. Primary Cardiologist: New Reason for Consultation: CHF  HPI: Mr. Pandya is a 75 year old male with a past medical history of DM, HTN, Afib (on Eliquis) and new diagnosis of systolic CHF (EF 40-98%).   He has noticed increasing SOB in the past 6 months, noting that he cannot climb a flight of stairs or walk up the incline in his yard. He also endorses PND especially in the last 6 months, and began sleeping in a recliner. Denies lower extremity edema. Does not weigh himself at home currently, so unaware of weight gain.  He presented to the ED on 07/29/15 with DOE and tachypnea with sudden onset. His Afib was rate controlled upon arrival to the ED.  CT Angio ruled out PE. Of note, he became severely dyspneic when lying flat for his CT, and required 100% NRB. BNP was elevated at 360.  Chest Xray showed bilateral pleural effusions and pulmonary congestion.  He was given IV Lasix and symptoms improved.    Echo shows EF of 20-25%, moderate concentric hypertrophy, and diffuse hypokinesis. He was diagnosed with Afib 2 years ago and did receive an Echo at that time, but states that he was never told the results.      He is mostly sedentary.  Reports adding salt to all of his foods.  Does not limit fluid intake. Does not smoke or drink alcohol.  He was recently given education about his CHF and seems willing to make changes in diet.  He understands the importance of daily weights.   Denies chest pain, palpitations. He has never had an ischemic evaluation.    Past Medical History  Diagnosis Date  . Diabetes mellitus without complication (HCC)   . Hypertension   . Atrial fibrillation [facility])      Past Surgical History  Procedure Laterality Date  . Hernia repair    . Kidney stone surgery       I have reviewed  the patient's current medications . apixaban  5 mg Oral BID  . atorvastatin  40 mg Oral q1800  . carvedilol  6.25 mg Oral BID WC  . furosemide  40 mg Oral Daily  . insulin aspart  0-15 Units Subcutaneous TID WC  . insulin aspart  0-5 Units Subcutaneous QHS  . lisinopril  5 mg Oral Daily  . sodium chloride flush  3 mL Intravenous Q12H     acetaminophen **OR** acetaminophen, polyethylene glycol  Prior to Admission medications   Medication Sig Start Date End Date Taking? Authorizing Provider  aspirin 81 MG tablet Take 161 mg by mouth daily.   Yes Historical Provider, MD  atenolol (TENORMIN) 100 MG tablet Take 100 mg by mouth daily.   Yes Historical Provider, MD  co-enzyme Q-10 50 MG capsule Take 50 mg by mouth daily.   Yes Historical Provider, MD  lisinopril (PRINIVIL,ZESTRIL) 2.5 MG tablet Take 2.5 mg by mouth daily.   Yes Historical Provider, MD  lisinopril (PRINIVIL,ZESTRIL) 5 MG tablet Take 5 mg by mouth daily.   Yes Historical Provider, MD  Multiple Vitamins-Minerals (MULTIVITAMIN WITH MINERALS) tablet Take 1 tablet by mouth daily.   Yes Historical Provider, MD  Naproxen Sodium (ALEVE) 220 MG CAPS Take 220 mg by mouth every 8 (eight) hours as needed (pain).   Yes Historical  Provider, MD  niacin (SLO-NIACIN) 500 MG tablet Take 500 mg by mouth at bedtime.   Yes Historical Provider, MD  Omega-3 Fatty Acids (FISH OIL) 1000 MG CAPS Take 1,000 mg by mouth daily.   Yes Historical Provider, MD     Social History   Social History  . Marital Status: Married    Spouse Name: N/A  . Number of Children: N/A  . Years of Education: N/A   Occupational History  . Not on file.   Social History Main Topics  . Smoking status: Never Smoker   . Smokeless tobacco: Not on file  . Alcohol Use: No  . Drug Use: No  . Sexual Activity: Not on file   Other Topics Concern  . Not on file   Social History Narrative  . No narrative on file    No family status information on file.   History  reviewed. No pertinent family history.   ROS:  Full 14 point review of systems complete and found to be negative unless listed above.  Physical Exam: Blood pressure 131/90, pulse 100, temperature 97.6 F (36.4 C), temperature source Oral, resp. rate 18, height 5\' 11"  (1.803 m), weight 220 lb 8 oz (100.018 kg), SpO2 97 %.  General: Well developed, well nourished, male in no acute distress Head: Eyes PERRLA, No xanthomas.   Normocephalic and atraumatic, oropharynx without edema or exudate.  Lungs: Crackles in RLL. Other lobes CTA.  Heart: Heart irregular rate and rhythm with S1, S2  No murmur. Pulses are 2+ extrem.   Neck: No carotid bruits. No lymphadenopathy. Abdomen: Bowel sounds present, abdomen soft and non-tender without masses or hernias noted. Msk:  No spine or cva tenderness. No weakness, no joint deformities or effusions. Extremities: No clubbing or cyanosis. Generalized edema Neuro: Alert and oriented X 3. No focal deficits noted. Psych:  Good affect, responds appropriately Skin: No rashes or lesions noted.  Labs:   Lab Results  Component Value Date   WBC 9.2 07/30/2015   HGB 16.4 07/30/2015   HCT 48.7 07/30/2015   MCV 93.1 07/30/2015   PLT 177 07/30/2015    Recent Labs  07/29/15 1340  INR 1.30    Recent Labs Lab 07/29/15 1856 07/30/15 0649  NA  --  142  K  --  4.2  CL  --  104  CO2  --  28  BUN  --  24*  CREATININE  --  1.10  CALCIUM  --  8.9  PROT 6.6  --   BILITOT 1.4*  --   ALKPHOS 66  --   ALT 20  --   AST 44*  --   GLUCOSE  --  129*  ALBUMIN 3.7  --     Recent Labs  07/29/15 1856 07/30/15 0108 07/30/15 0649  TROPONINI 0.04* 0.03 0.04*    Recent Labs  07/29/15 1347  TROPIPOC 0.00   B NATRIURETIC PEPTIDE  Date/Time Value Ref Range Status  07/29/2015 01:40 PM 360.6* 0.0 - 100.0 pg/mL Final   Lab Results  Component Value Date   CHOL 130 07/29/2015   HDL 30* 07/29/2015   LDLCALC 88 07/29/2015   TRIG 61 07/29/2015   Lab Results    Component Value Date   DDIMER 0.62* 07/29/2015   No results found for: LIPASE, AMYLASE TSH  Date/Time Value Ref Range Status  07/29/2015 06:57 PM 1.434 0.350 - 4.500 uIU/mL Final    Echo: 07/31/15 - Left ventricle: The cavity size was normal. There was  moderate  concentric hypertrophy. Systolic function was severely reduced.  The estimated ejection fraction was in the range of 20% to 25%.  Diffuse hypokinesis. - Aortic valve: There was trivial regurgitation. - Mitral valve: Calcified annulus. - Left atrium: The atrium was moderately dilated. - Right ventricle: The cavity size was mildly dilated. Wall  thickness was normal. - Right atrium: The atrium was moderately dilated. - Tricuspid valve: There was trivial regurgitation. - Pulmonary arteries: Systolic pressure was mildly increased. PA  peak pressure: 39 mm Hg (S).  ECG: Afib/    ASSESSMENT AND PLAN:    Active Problems:   Respiratory distress   T2DM (type 2 diabetes mellitus) (HCC)   HTN (hypertension)   Atrial fibrillation (HCC)   Acute respiratory distress (HCC)   Cirrhosis (HCC)   Acute on chronic congestive heart failure (HCC)  1. Acute on chronic systolic CHF:  -This is a new diagnosis for the patient. He does report some increasing SOB with activity over the past 6 months. He had acute onset SOB at rest and with very minimal exertion which prompted him to come to the ED on 07/29/15. He had elevated BNP on admission.  He was treated with 3 days of IV Lasix, his weight is down 14 pounds.  He is negative 4 L for this admission.   -His renal function is within normal range.  He will need to go home on  daily.  He was started on ACEI this admission.  His SBP is in 130-140 range, so there is room to increase this.  -The CHF nurse has given him education about his diet, fluid restriction and daily weights.  - he will need ischemic evaluation given this is a new diagnosis.   2. Atrial Fibrillation: Review of tele  shows patient's rate is consistently above 100, with rates as high as 140.  He will need tighter control of his rate in order to not further exacerbate his CHF.  We can increase his Coreg to 12.5mg  BID.  His SBP is 130-140 range. He has agreed to start Eliquis for anticoagulation.  He previously has been reluctant.   This patients CHA2DS2-VASc Score and unadjusted Ischemic Stroke Rate (% per year) is equal to 7.2 % stroke rate/year from a score of 5  Above score calculated as 1 point each if present [CHF, HTN, DM, Vascular=MI/PAD/Aortic Plaque, Age if 65-74, or Male] Above score calculated as 2 points each if present [Age > 75, or Stroke/TIA/TE]   3. HTN: On beta blocker, ACEI.  BP's are well controlled.          Signed: Little Ishikawa, NP 08/01/2015 10:55 AM Pager [phone]  Co-Sign MD  The patient has been seen in conjunction with Suzzette Righter, NP. All aspects of care have been considered and discussed. The patient has been personally interviewed, examined, and all clinical data has been reviewed.   2 year history of atrial fibrillation, poor clinical follow-up for management of rate/rhythm, and current presentation in acute heart failure with LVEF less than 25%..  Compliance with medication regimen is questionable. He previously refused anticoagulation therapy.   Heart failure responded to IV Lasix.  Overall, acute left ventricular failure of uncertain etiology.Tachycardia induced LV dysfunction is likely. This is also in the setting of underlying coronary disease as evidenced by atherosclerosis noted on CT. Significant coronary disease needs to be excluded.  Management strategy now is rate control, anticoagulation therapy for at least 4 weeks, and ultimate plan to perform cardioversion in  attempt to achieve and maintain rhythm rather than settle for rate control. To accomplish this, beta blocker therapy will need to be increased. Low-dose amiodarone will be started, in the  hope that he will maintain sinus rhythm at the time of cardioversion in one month. An ischemic evaluation will be necessary. This could be done as an outpatient once her rhythm/rate is better controlled. Secondary prevention with good blood pressure control and lipid management is also important.  He will be eligible for discharge once good rate control has been established (resting HR < 85 bpm).

## 2015-08-01 NOTE — Progress Notes (Signed)
Heart Failure Navigator Consult Note  Presentation: Jason [name] is a 75 y.o. male presenting with dyspnea. Patient reports acute onset of dyspnea this AM when putting on his shoes. He then became diaphoretic and nauseous. This episode lasted for 5-10 minutes and improved with rest. His wife called EMS and he was able to walk to stretcher after they arrived. He denies any chest pain or previous episodes of this. No history of VTE, ACS, CVA, CAD. He has been having PND x2 months, but denies orthopnea (sleeps on one pillow). He was hypoxic (per report) on arrival to ED, but this improved with 3L [place]. While attempting to get CTA chest, patient became acutely dyspneic and hypoxic while laying flat. Required NRB to ease hypoxia and increased WOB.  Patient reports that when he was initially diagnosed with a fib ~2 yrs ago, he saw a cardiologist in [place]. He got an Echo but was never told the results or started on any medications.  Past Medical History  Diagnosis Date  . Diabetes mellitus without complication (HCC)   . Hypertension   . Atrial fibrillation [facility](HCC)     Social History   Social History  . Marital Status: Married    Spouse Name: N/A  . Number of Children: N/A  . Years of Education: N/A   Social History Main Topics  . Smoking status: Never Smoker   . Smokeless tobacco: None  . Alcohol Use: No  . Drug Use: No  . Sexual Activity: Not Asked   Other Topics Concern  . None   Social History Narrative  . None    ECHO:Study Conclusions--07/31/15  - Left ventricle: The cavity size was normal. There was moderate  concentric hypertrophy. Systolic function was severely reduced.  The estimated ejection fraction was in the range of 20% to 25%.  Diffuse hypokinesis. - Aortic valve: There was trivial regurgitation. - Mitral valve: Calcified annulus. - Left atrium: The atrium was moderately dilated. - Right ventricle: The cavity size was mildly dilated. Wall  thickness  was normal. - Right atrium: The atrium was moderately dilated. - Tricuspid valve: There was trivial regurgitation. - Pulmonary arteries: Systolic pressure was mildly increased. PA  peak pressure: 39 mm Hg (S).  Transthoracic echocardiography. M-mode, complete 2D, spectral Doppler, and color Doppler. Birthdate: Patient birthdate: 1940/12/11. Age: Patient is 75 yr old. Sex: Gender: male. BMI: 30.7 kg/m^2. Blood pressure: 135/74 Patient status: Inpatient. Study date: Study date: 07/31/2015. Study time: 01:49 PM. Location: Echo laboratory.  BNP    Component Value Date/Time   BNP 360.6* 07/29/2015 1340    ProBNP No results found for: PROBNP   Education Assessment and Provision:  Detailed education and instructions provided on heart failure disease management including the following:  Signs and symptoms of Heart Failure When to call the physician Importance of daily weights Low sodium diet Fluid restriction Medication management Anticipated future follow-up appointments  Patient education given on each of the above topics.  Patient acknowledges understanding and acceptance of all instructions.  I spoke at length with Mr. Marina Goodellerry regarding his HF.  He takes care of his wife at home as she is on PD.  He has a scale at home and we discussed the importance of daily weights.  I informed him when to call the physician and how weight increases relate to the signs and symptoms of HF.  We also discussed a low sodium diet and sodium restriction to 2000 mg or less per day.  He denies any issues with  taking prescribed medications--however he does admit that he was quite reluctant to take Lesia Hausen in the past.  I have encouraged him that the medications are a very important part of his treatment.  He acknowledges understanding and agrees that he will take prescribed medications at the time of discharge.  He will likely follow with [facility] after discharge.  Education  Materials:  "Living Better With Heart Failure" Booklet, Daily Weight Tracker Tool   High Risk Criteria for Readmission and/or Poor Patient Outcomes:   EF <30%- yes 20-25%  2 or more admissions in 6 months- No  Difficult social situation- Yes-- he cares for his wife who is on PD  Demonstrates medication noncompliance- denies    Barriers of Care: Knowledge and compliance    Discharge Planning:   Patient plans to return to home with wife in [place].  He would benefit from [facility] for ongoing HF education, symptom recognition and compliance reinforcement.

## 2015-08-01 NOTE — Care Management Important Message (Signed)
Important Message  Patient Details  Name: Jason [name] MRN: [id number] Date of Birth: [birth year]   Medicare Important Message Given:  Yes    [name],  [name] 08/01/2015, 9:56 AM

## 2015-08-01 NOTE — Progress Notes (Signed)
Family Medicine Teaching Service Daily Progress Note Intern Pager: [phone]  Patient name: Jason [name] Medical record number: [id number] Date of birth: 03-09-1941 Age: 75 y.o. Gender: male  Primary Care Provider: No primary care provider on file. Consultants: none Code Status: full  Pt Overview and Major Events to Date:  4/16 admitted with dyspnea  Assessment and Plan: Jason [name] is a 75 y.o. male presenting with dyspnea. PMH is significant for T2DM, HTN, afib.  Dyspnea/HFrEF: improved. Likely new onset CHF. On admission, patient with crackles, BNP to 361, CXR with b/l pleural effusions and pulm congestion. Echo with EF of 20% to 25%, diffuse hypokinesis, mod LAE and RAE, PAPP 39. EF is hard to interpret in the setting of Afib. ACS and PE ruled out. EKG with Afib and RBBB. S/p aspirin  in ED, s/p IV Lasix  x2.  - f/u card recs - continue lasix, lisinopril and coreg as below - strict Is&Os 1.8/-1.1/-4.3 - Weight: 226>223>220>220 -Bmet - ASVD risk 39%. D/c Niacin. Started atorvastatin and ASA - O2 as needed. Currently on RA  T2DM: Last A1c unavailable. CBG 150-200. Patient takes Metformin (unsure of dose) at home. A1c 7.2 - hold home metformin in setting of contrast load - mod SSI with HS coverage  A fib: CHADsVasc 4 (5 if HF). HAS BLED 4.1%. Discussed this risks of stroke and bleeding with patient. He is willing to take Eliqius. - Eliqius 5 mg twice a day. - D/cd atenolol - coreg 6.25mg  twice a day  - tele  HTN: normotensive - continue home lisinopril  daily  Possible Cirrhosis on CTA chest: incidentally noted. Patient without history of alcohol abuse. AST 44 and total bili mildly elevated. - RUQ [place] suggestive for cholelithiasis, mild gall bladder thickening and fatty liver. No murphy of sign of infection.  - hepatitis panel and HIV negative   FEN/GI: heart/carb mod diet, SLIV  Prophylaxis: Eliquis   Disposition: discharge home pending card  evaluation  Subjective:  No shortness of breath, chest pain or abdominal pain. Wants to go home today but understands that we have to wait on cardiology for their input on his Echo result.  Objective: Temp:  [97.6 F (36.4 C)-98 F (36.7 C)] 97.6 F (36.4 C) (04/19 0626) Pulse Rate:  [55-100] 100 (04/19 0626) Resp:  [18] 18 (04/19 0626) BP: (131-140)/(66-90) 131/90 mmHg (04/19 0626) SpO2:  [97 %-98 %] 97 % (04/19 0626) Weight:  [220 lb 8 oz (100.018 kg)] 220 lb 8 oz (100.018 kg) (04/19 2440) Physical Exam: General: sitting in chair Cardiovascular: Irreg irreg, no m/r/g, intact DP pulses, no edema in legs Respiratory: Normal WOB, speaks in full sentences, CTAB Abdomen: Soft, NTND, +BS Extremities: warm and dry Skin: no rashes noted Neuro: A&O, no gross deficits  Laboratory:  Recent Labs Lab 07/29/15 1340 07/29/15 1348 07/30/15 0649  WBC 9.4  --  9.2  HGB 17.2* 18.4* 16.4  HCT 52.0 54.0* 48.7  PLT 180  --  177    Recent Labs Lab 07/29/15 1340 07/29/15 1348 07/29/15 1856 07/30/15 0649  NA 141 143  --  142  K 4.6 4.6  --  4.2  CL 107 107  --  104  CO2 23  --   --  28  BUN 25* 33*  --  24*  CREATININE 1.08 1.00  --  1.10  CALCIUM 9.6  --   --  8.9  PROT  --   --  6.6  --   BILITOT  --   --  1.4*  --   ALKPHOS  --   --  66  --   ALT  --   --  20  --   AST  --   --  44*  --   GLUCOSE 173* 162*  --  129*    Imaging/Diagnostic Tests: Echo: EF 20% to 25%, diffuse hypokinesis, mod LAE and RAE, PAPP 39  Almon Herculesaye T , MD 08/01/2015, 6:39 AM PGY-1, [place] FPTS Intern pager: [phone], text pages welcome

## 2015-08-02 DIAGNOSIS — K7460 Unspecified cirrhosis of liver: Secondary | ICD-10-CM

## 2015-08-02 LAB — GLUCOSE, CAPILLARY
Glucose-Capillary: 122 mg/dL — ABNORMAL HIGH (ref 65–99)
Glucose-Capillary: 130 mg/dL — ABNORMAL HIGH (ref 65–99)

## 2015-08-02 MED ORDER — ATORVASTATIN CALCIUM 40 MG PO TABS: 40.0000 mg | ORAL_TABLET | Freq: Every day | ORAL | Status: DC

## 2015-08-02 MED ORDER — AMIODARONE HCL 200 MG PO TABS: 200.0000 mg | ORAL_TABLET | Freq: Two times a day (BID) | ORAL | Status: DC

## 2015-08-02 MED ORDER — FUROSEMIDE 40 MG PO TABS: 40.0000 mg | ORAL_TABLET | Freq: Every day | ORAL | Status: DC

## 2015-08-02 MED ORDER — LISINOPRIL 5 MG PO TABS: 5.0000 mg | ORAL_TABLET | Freq: Every day | ORAL | Status: DC

## 2015-08-02 MED ORDER — APIXABAN 5 MG PO TABS: 5.0000 mg | ORAL_TABLET | Freq: Two times a day (BID) | ORAL | Status: DC

## 2015-08-02 MED ORDER — ASPIRIN 81 MG PO TABS: 81.0000 mg | ORAL_TABLET | Freq: Every day | ORAL | Status: DC

## 2015-08-02 MED ORDER — CARVEDILOL 12.5 MG PO TABS: 12.5000 mg | ORAL_TABLET | Freq: Two times a day (BID) | ORAL | Status: DC

## 2015-08-02 NOTE — Progress Notes (Signed)
       Patient Name: Jason [name] Date of Encounter: 08/02/2015    SUBJECTIVE:No difficulty over night.  TELEMETRY:  AF with only moderate rate control. Filed Vitals:   08/01/15 1225 08/01/15 2033 08/01/15 2242 08/02/15 0456  BP: 123/84 99/70  121/85  Pulse: 125 37 84 97  Temp: 97.9 F (36.6 C) 97.6 F (36.4 C)  98.6 F (37 C)  TempSrc: Oral Oral  Oral  Resp: 18 16  16   Height:      Weight:    217 lb 14.4 oz (98.839 kg)  SpO2: 98% 99%  100%    Intake/Output Summary (Last 24 hours) at 08/02/15 0905 Last data filed at 08/02/15 0818  Gross per 24 hour  Intake    600 ml  Output    600 ml  Net      0 ml   LABS: Basic Metabolic Panel:  Recent Labs  57/84/6904/19/17 0934  NA 139  K 4.2  CL 102  CO2 28  GLUCOSE 245*  BUN 24*  CREATININE 1.14  CALCIUM 9.4     Radiology/Studies:  No new data.  Physical Exam: Blood pressure 121/85, pulse 97, temperature 98.6 F (37 C), temperature source Oral, resp. rate 16, height 5\' 11"  (1.803 m), weight 217 lb 14.4 oz (98.839 kg), SpO2 100 %. Weight change: -2 lb 9.6 oz (-1.179 kg)  Wt Readings from Last 3 Encounters:  08/02/15 217 lb 14.4 oz (98.839 kg)   Neck exam reveals no JVD. Chest reveals clear lung fields Cardiac exam reveals irregularly irregular rhythm. Apical systolic murmurs heard. Trace bilateral lower extremity ankle edema.  ASSESSMENT:  1. Persistent atrial fibrillation with poor rate control. Rate control has improved on the current medical regimen of carvedilol and amiodarone. 2. Acute reduction in LV systolic function, likely related to atrial fibrillation with rapid rate.he presented with acute systolic heart failure now improved on medical therapy 3. Anticoagulation has been established with Apixaban without complications. 4. Hypertension, essential  Plan:  1. The patient is eligible for discharge today. 2. He will need cardiology outpatient follow-up in 7-14 days to adjust medication regimen which may  include decreasing amiodarone to 200 mg per day after 3 weeks of 200 mg twice a day. The office visit will also help facilitate scheduling the patient for elective cardioversion, which should not be done until 4 weeks after starting apixaban. 3. He will need to be scheduled for an ischemic evaluation either with coronary angiography or myocardial perfusion imaging. I believe this could be done after we have reestablished sinus rhythm.  Selinda EonSigned, SMITH III,HENRY W 08/02/2015, 9:05 AM

## 2015-08-02 NOTE — Progress Notes (Signed)
Discharge [facility]

## 2015-08-02 NOTE — Discharge Instructions (Signed)
It has been a pleasure taking care of you! You were admitted due to shortness of breath and atrial fibrillation (irregular heart beat). Your shortness of breath is due to fluid build up in your lung due to heart failure. We have treated your with fluid pills to remove the fluid. This helped your breathing.  We have also started you on blood thinner to reduce your risk of having stroke from atrial failure. We are discharging you on this medication and other additional medications that you need to continue taking after you leave the hospital.  We have made some adjustments to your other medications. Please, make sure to read the directions before you take them. The names and directions on how to take these medications are found on this discharge paper under medication section.  You also need a follow up with your primary care doctor and cardiologist. Please call your primary care doctor office as soon possible. You should hear from your cardiologist office in a week. If your don't hear from them in a week, please give them a call.   Take care,

## 2015-08-02 NOTE — Discharge Summary (Signed)
Family Medicine Teaching [facility] Discharge Summary  Patient name: Jason [name] Medical record number: [id number] Date of birth: 09-06-1940 Age: 75 y.o. Gender: male Date of Admission: 07/29/2015  Date of Discharge: 08/02/2015  Admitting Physician: Moses Manners, MD  Primary Care Provider: No primary care provider on file. Consultants: cardiology  Indication for Hospitalization: shortness of breath  Discharge Diagnoses/Problem List:  Patient Active Problem List   Diagnosis Date Noted  . Acute on chronic congestive heart failure (HCC)   . Respiratory distress 07/29/2015  . T2DM (type 2 diabetes mellitus) (HCC) 07/29/2015  . HTN (hypertension) 07/29/2015  . Atrial fibrillation (HCC) 07/29/2015  . Acute respiratory distress (HCC) 07/29/2015   Disposition: home  Discharge Condition: improved and stable  Discharge Exam:  General: sitting in chair Cardiovascular: Irreg irreg, no m/r/g, intact DP pulses, no edema in legs Respiratory: Normal WOB, speaks in full sentences, CTAB Abdomen: Soft, NTND, +BS Extremities: warm and dry Skin: no rashes noted Neuro: A&O, no gross deficits  Brief Hospital Course:  Jason [name] is a 75 yo M with history of T2DM, HTN & a fib (not on anticoagulant) who presented to the ED on 07/29/15 with DOE and tachypnea with sudden onset.   Dyspnea: likely secondary to new onset CHF. Patient with PND and DOE. No extremity edema but lungs with crackles.BNP elevated to 360 (no baseline). CXR with bilateral pleural effusions and pulmunary congestion. He became severely dyspneic when lying flat for his CTA lung, and required 100% NRB. CTA negative for PE. Troponin 0.04>0.03>0.04 and EKG with Afib and RBBB (have no prior to compare to).TSH 1.4. He received aspirin  & IV Lasix   In ED and admitted to step down floor.   On step down unit, he was diuresed with IV lasix twice a day with subsequent improvement in his dyspnea the following morning.  So, patient was transferred to floor on oral lasix 40 mg daily.   Echo was done on 4/18 and showed EF of 20-25%, moderate concentric hypertrophy, and diffuse hypokinesis. Cardiology was consulted and recommended good rate control so increased coreg to 12.5 mg twice a day. They also started amiodarone at 200 mg twice a day. Patient is to follow up with cardiology for more ischemic work up and possible cardioversion in about a month.   Afib: diagnosed 2 years ago. He wasn't on anticoagulation prior to presentation. CHADsVasc 4 (5 if HF). HAS BLED 4.1%. After discussion with patient about his risk of stroke and bleeding, he chose to be started on anticoagulation, particularly Eliqius.   T2DM: Last A1c unavailable. Patient takes Metformin (unsure of dose) at home. A1c 7.2. Held home metformin in setting of contrast load and started him on moderate SSI with bedtime coverage. His home metformin was resumed at discharge.  Other chronic conditions were stable.   Issues for Follow Up:  1. CHF: assess fluid status at follow up. Patient to have outpatient ischemic work up with cardiology in about a month. 2. Afib: started on Amiodarone and Eliquis. Increased his Coreg to 12.5 mg twice a day. Will follow up with card for possible cardioversion.   Significant Procedures: none  Significant Labs and Imaging:   Recent Labs Lab 07/29/15 1340 07/29/15 1348 07/30/15 0649  WBC 9.4  --  9.2  HGB 17.2* 18.4* 16.4  HCT 52.0 54.0* 48.7  PLT 180  --  177    Recent Labs Lab 07/29/15 1340 07/29/15 1348 07/29/15 1856 07/30/15 0649 08/01/15 0934  NA 141  143  --  142 139  K 4.6 4.6  --  4.2 4.2  CL 107 107  --  104 102  CO2 23  --   --  28 28  GLUCOSE 173* 162*  --  129* 245*  BUN 25* 33*  --  24* 24*  CREATININE 1.08 1.00  --  1.10 1.14  CALCIUM 9.6  --   --  8.9 9.4  ALKPHOS  --   --  66  --   --   AST  --   --  44*  --   --   ALT  --   --  20  --   --   ALBUMIN  --   --  3.7  --   --      Results/Tests Pending at Time of Discharge: none  Discharge Medications:    Medication List    STOP taking these medications        ALEVE 220 MG Caps  Generic drug:  Naproxen Sodium     atenolol 100 MG tablet  Commonly known as:  TENORMIN     co-enzyme Q-10 50 MG capsule     Fish Oil 1000 MG Caps     niacin 500 MG tablet  Commonly known as:  SLO-NIACIN      TAKE these medications        amiodarone 200 MG tablet  Commonly known as:  PACERONE  Take 1 tablet (200 mg total) by mouth 2 (two) times daily.     apixaban 5 MG Tabs tablet  Commonly known as:  ELIQUIS  Take 1 tablet (5 mg total) by mouth 2 (two) times daily.     aspirin 81 MG tablet  Take 1 tablet (81 mg total) by mouth daily.     atorvastatin 40 MG tablet  Commonly known as:  LIPITOR  Take 1 tablet (40 mg total) by mouth daily at 6 PM.     carvedilol 12.5 MG tablet  Commonly known as:  COREG  Take 1 tablet (12.5 mg total) by mouth 2 (two) times daily with a meal.     furosemide 40 MG tablet  Commonly known as:  LASIX  Take 1 tablet (40 mg total) by mouth daily.     lisinopril 5 MG tablet  Commonly known as:  PRINIVIL,ZESTRIL  Take 1 tablet (5 mg total) by mouth daily.     multivitamin with minerals tablet  Take 1 tablet by mouth daily.        Discharge Instructions: Please refer to Patient Instructions section of EMR for full details.  Patient was counseled important signs and symptoms that should prompt return to medical care, changes in medications, dietary instructions, activity restrictions, and follow up appointments.   Follow-Up Appointments: Follow-up Information    Follow up with HOUT, BRITTANY, PA-C. Go on 08/13/2015.   Specialty:  Physician Assistant   Why:  hospital follow up @ 2:30pm   Contact information:   [facility] [address] [place] [address] [phone]       Follow up with Lesleigh NoeSMITH III,HENRY W, MD. Go on 08/10/2015.   Specialty:  Cardiology   Why:   at 2:30pm for appt. with Dr. Richardean SaleSmith    Contact information:   [address] [place] [address] [phone]       Almon Herculesaye T Gonfa, MD 08/02/2015, 4:35 PM PGY-1, [facility]

## 2015-08-02 NOTE — Progress Notes (Signed)
Patient discharging to home. Reviewed discharge instructions with patient. Given a copy of AVS with patient. Patient verbalized understanding medications, follow-up appointments and instructions in the AVS. Patient has all his belongings to take home (dentures and clothes). Will be taken to transportation when family gets here .

## 2015-08-10 ENCOUNTER — Encounter: Payer: Self-pay | Admitting: Interventional Cardiology

## 2015-08-10 ENCOUNTER — Encounter: Payer: Self-pay | Admitting: *Deleted

## 2015-08-10 ENCOUNTER — Ambulatory Visit (INDEPENDENT_AMBULATORY_CARE_PROVIDER_SITE_OTHER): Payer: Medicare Other | Admitting: Interventional Cardiology

## 2015-08-10 ENCOUNTER — Telehealth: Payer: Self-pay | Admitting: *Deleted

## 2015-08-10 VITALS — BP 114/90 | HR 80 | Ht 71.0 in | Wt 214.4 lb

## 2015-08-10 DIAGNOSIS — I5032 Chronic diastolic (congestive) heart failure: Secondary | ICD-10-CM | POA: Insufficient documentation

## 2015-08-10 DIAGNOSIS — I2510 Atherosclerotic heart disease of native coronary artery without angina pectoris: Secondary | ICD-10-CM | POA: Diagnosis not present

## 2015-08-10 DIAGNOSIS — I10 Essential (primary) hypertension: Secondary | ICD-10-CM | POA: Diagnosis not present

## 2015-08-10 DIAGNOSIS — I4819 Other persistent atrial fibrillation: Secondary | ICD-10-CM

## 2015-08-10 DIAGNOSIS — I481 Persistent atrial fibrillation: Secondary | ICD-10-CM | POA: Diagnosis not present

## 2015-08-10 DIAGNOSIS — I5023 Acute on chronic systolic (congestive) heart failure: Secondary | ICD-10-CM | POA: Diagnosis not present

## 2015-08-10 NOTE — Patient Instructions (Addendum)
Medication Instructions:   Your physician recommends that you continue on your current medications as directed. Please refer to the Current Medication list given to you today.     If you need a refill on your cardiac medications before your next appointment, please call your pharmacy.  Labwork:   RETURN FOR LAB WORK ON  08/22/15  CBC BMET    Testing/Procedures:  SEE LETTER FOR CARDIOVERSION   Follow-Up:  RETURN FOR AN PHARM D VISIT FOR AN EKG ON THE SAM DAY AS LAB    Any Other Special Instructions Will Be Listed Below (If Applicable).

## 2015-08-10 NOTE — Telephone Encounter (Signed)
SPOKE TO WIFE ABOUT SCHEDULED APPT 08/27/15 FOR PT TO COME IN 11:15 FOR AN EKG AND LAB WORK.Marland Kitchen.BEFORE CARDIOVERSION ON 08/28/15..Marland Kitchen

## 2015-08-10 NOTE — Progress Notes (Signed)
Cardiology Office Note   Date:  08/10/2015   ID:  Jason MerinoLewis S Sebastiani, DOB 11-03-1940, MRN [id number]  PCP:  Tanna FurryHOUT, BRITTANY, PA-C  Cardiologist:  Lesleigh NoeSMITH III, W, MD   Chief Complaint  Patient presents with  . Congestive Heart Failure  . Atrial Fibrillation      History of Present Illness: Jason MerinoLewis S [name] is a 75 y.o. male who presents for Follow-up of acute on chronic systolic heart failure (LVEF by echo 20-25%), atrial fibrillation of unknown duration, hypertension, and silent coronary disease to noted by calcium on CT scan.  The weight on his home scales is running between 217 and 218 pounds and has been stable since discharge. He feels great. His daughter is a here with him today. There was a full explanation of his current status. He denies chest pain. Lower extremity swelling is resolved. As noted above weight is been stable. No bleeding.  Past Medical History  Diagnosis Date  . Diabetes mellitus without complication (HCC)   . Hypertension   . Atrial fibrillation [facility](HCC)     Past Surgical History  Procedure Laterality Date  . Hernia repair    . Kidney stone surgery       Current Outpatient Prescriptions  Medication Sig Dispense Refill  . amiodarone (PACERONE) 200 MG tablet Take 1 tablet (200 mg total) by mouth 2 (two) times daily. 60 tablet 1  . apixaban (ELIQUIS) 5 MG TABS tablet Take 1 tablet (5 mg total) by mouth 2 (two) times daily. 60 tablet 1  . aspirin 81 MG tablet Take 1 tablet (81 mg total) by mouth daily. 30 tablet 0  . atorvastatin (LIPITOR) 40 MG tablet Take 1 tablet (40 mg total) by mouth daily at 6 PM. 30 tablet 1  . carvedilol (COREG) 12.5 MG tablet Take 1 tablet (12.5 mg total) by mouth 2 (two) times daily with a meal. 60 tablet 1  . furosemide (LASIX) 40 MG tablet Take 1 tablet (40 mg total) by mouth daily. 30 tablet 1  . lisinopril (PRINIVIL,ZESTRIL) 5 MG tablet Take 1 tablet (5 mg total) by mouth daily. 30 tablet 1  . metFORMIN (GLUCOPHAGE) 500 MG  tablet Take 500 mg by mouth daily with breakfast.    . Multiple Vitamins-Minerals (MULTIVITAMIN WITH MINERALS) tablet Take 1 tablet by mouth daily.     No current facility-administered medications for this visit.    Allergies:   Review of patient's allergies indicates no known allergies.    Social History:  The patient  reports that he has never smoked. He does not have any smokeless tobacco history on file. He reports that he does not drink alcohol or use illicit drugs.   Family History:  The patient's family history is not on file.    ROS:  Please see the history of present illness.   Otherwise, review of systems are positive for no specific complaints.   All other systems are reviewed and negative.    PHYSICAL EXAM: VS:  BP 114/90 mmHg  Pulse 80  Ht 5\' 11"  (1.803 m)  Wt 214 lb 6.4 oz (97.251 kg)  BMI 29.92 kg/m2 , BMI Body mass index is 29.92 kg/(m^2). GEN: Well nourished, well developed, in no acute distress HEENT: normal Neck: no JVD, carotid bruits, or masses Cardiac: IIRR.  There is no murmur, rub, or gallop. There is no edema. Respiratory:  clear to auscultation bilaterally, normal work of breathing. GI: soft, nontender, nondistended, + BS MS: no deformity or atrophy Skin: warm  and dry, no rash Neuro:  Strength and sensation are intact Psych: euthymic mood, full affect   EKG:  EKG is not ordered today.    Recent Labs: 07/29/2015: ALT 20; B Natriuretic Peptide 360.6*; TSH 1.434 07/30/2015: Hemoglobin 16.4; Platelets 177 08/01/2015: BUN 24*; Creatinine, Ser 1.14; Potassium 4.2; Sodium 139    Lipid Panel    Component Value Date/Time   CHOL 130 07/29/2015 1857   TRIG 61 07/29/2015 1857   HDL 30* 07/29/2015 1857   CHOLHDL 4.3 07/29/2015 1857   VLDL 12 07/29/2015 1857   LDLCALC 88 07/29/2015 1857      Wt Readings from Last 3 Encounters:  08/10/15 214 lb 6.4 oz (97.251 kg)  08/02/15 217 lb 14.4 oz (98.839 kg)      Other studies Reviewed: Additional  studies/ records that were reviewed today include: Reviewed echo report from the hospital stay. The findings include:. Echocardiogram 07/31/15 Study Conclusions  - Left ventricle: The cavity size was normal. There was moderate  concentric hypertrophy. Systolic function was severely reduced.  The estimated ejection fraction was in the range of 20% to 25%.  Diffuse hypokinesis. - Aortic valve: There was trivial regurgitation. - Mitral valve: Calcified annulus. - Left atrium: The atrium was moderately dilated. - Right ventricle: The cavity size was mildly dilated. Wall  thickness was normal. - Right atrium: The atrium was moderately dilated. - Tricuspid valve: There was trivial regurgitation. - Pulmonary arteries: Systolic pressure was mildly increased. PA  peak pressure: 39 mm Hg (S).  ASSESSMENT AND PLAN:  1. Persistent atrial fibrillation (HCC) Heart rate is controlled. We'll need to now set the patient up for elective cardioversion. This will be done after we have had him on anticoagulation therapy for at least 3 weeks.  2. Essential hypertension Controlled.  3. Acute on chronic systolic heart failure (HCC) No volume overload  4. Coronary artery calcification seen on CT scan The knee myocardial perfusion study or cath after O cardioversion    Current medicines are reviewed at length with the patient today.  The patient has the following concerns regarding medicines: None.  The following changes/actions have been instituted:    Decrease amiodarone to 200 mg per day starting one week prior to cardioversion  Cardioversion in 3 weeks  Follow-up with me in one month  EKG and BMET/CBC as preop prior to cardioversion  Procedure and risks was discussed with the patient and his daughters. The risk includes the possibility of CVA, anesthesia side effects, mechanical injury at the time of shot, skin injury, dental injury, potential pacemaker therapy, and cardiac  arrest.    abs/ tests ordered today include:  No orders of the defined types were placed in this encounter.     Disposition:   FU with HS in 1 month  Signed, Lesleigh Noe, MD  08/10/2015 2:59 PM    [facility] [address] [place], [place], [place]  [address] Phone: [phone]; Fax: [phone]

## 2015-08-13 DIAGNOSIS — Z6830 Body mass index (BMI) 30.0-30.9, adult: Secondary | ICD-10-CM | POA: Diagnosis not present

## 2015-08-13 DIAGNOSIS — E669 Obesity, unspecified: Secondary | ICD-10-CM | POA: Diagnosis not present

## 2015-08-13 DIAGNOSIS — I509 Heart failure, unspecified: Secondary | ICD-10-CM | POA: Diagnosis not present

## 2015-08-13 DIAGNOSIS — I4891 Unspecified atrial fibrillation: Secondary | ICD-10-CM | POA: Diagnosis not present

## 2015-08-14 ENCOUNTER — Telehealth: Payer: Self-pay | Admitting: Cardiology

## 2015-08-14 NOTE — Telephone Encounter (Signed)
New message   Daughter(Beverly) wanting to know of a time for the patient to be a short stay center the day of Dad's procedure.

## 2015-08-14 NOTE — Telephone Encounter (Signed)
Spoke with pt dtr, questions regarding up coming DCCV answered.

## 2015-08-19 DIAGNOSIS — Z79899 Other long term (current) drug therapy: Secondary | ICD-10-CM | POA: Diagnosis not present

## 2015-08-19 DIAGNOSIS — I4891 Unspecified atrial fibrillation: Secondary | ICD-10-CM | POA: Diagnosis not present

## 2015-08-19 DIAGNOSIS — Z23 Encounter for immunization: Secondary | ICD-10-CM | POA: Diagnosis not present

## 2015-08-19 DIAGNOSIS — I509 Heart failure, unspecified: Secondary | ICD-10-CM | POA: Diagnosis not present

## 2015-08-19 DIAGNOSIS — S61012A Laceration without foreign body of left thumb without damage to nail, initial encounter: Secondary | ICD-10-CM | POA: Diagnosis not present

## 2015-08-27 ENCOUNTER — Other Ambulatory Visit (INDEPENDENT_AMBULATORY_CARE_PROVIDER_SITE_OTHER): Payer: Medicare Other | Admitting: *Deleted

## 2015-08-27 ENCOUNTER — Ambulatory Visit (INDEPENDENT_AMBULATORY_CARE_PROVIDER_SITE_OTHER): Payer: Medicare Other | Admitting: *Deleted

## 2015-08-27 VITALS — HR 72

## 2015-08-27 DIAGNOSIS — Z01818 Encounter for other preprocedural examination: Secondary | ICD-10-CM

## 2015-08-27 DIAGNOSIS — I4891 Unspecified atrial fibrillation: Secondary | ICD-10-CM

## 2015-08-27 DIAGNOSIS — I481 Persistent atrial fibrillation: Secondary | ICD-10-CM | POA: Diagnosis not present

## 2015-08-27 DIAGNOSIS — I4819 Other persistent atrial fibrillation: Secondary | ICD-10-CM

## 2015-08-27 LAB — CBC
HEMATOCRIT: 47.5 % (ref 38.5–50.0)
HEMOGLOBIN: 16.8 g/dL (ref 13.2–17.1)
MCH: 31.4 pg (ref 27.0–33.0)
MCHC: 35.4 g/dL (ref 32.0–36.0)
MCV: 88.8 fL (ref 80.0–100.0)
MPV: 10.5 fL (ref 7.5–12.5)
Platelets: 177 10*3/uL (ref 140–400)
RBC: 5.35 MIL/uL (ref 4.20–5.80)
RDW: 13.3 % (ref 11.0–15.0)
WBC: 8 10*3/uL (ref 3.8–10.8)

## 2015-08-27 LAB — BASIC METABOLIC PANEL
BUN: 23 mg/dL (ref 7–25)
CALCIUM: 10.1 mg/dL (ref 8.6–10.3)
CO2: 28 mmol/L (ref 20–31)
Chloride: 100 mmol/L (ref 98–110)
Creat: 1.19 mg/dL — ABNORMAL HIGH (ref 0.70–1.18)
GLUCOSE: 119 mg/dL — AB (ref 65–99)
Potassium: 4.1 mmol/L (ref 3.5–5.3)
SODIUM: 140 mmol/L (ref 135–146)

## 2015-08-27 NOTE — Progress Notes (Signed)
[facility]  [facility]  DR  Katrinka BlazingSMITH  REVIEWED  PT  TO PROCEED  AS PLANNED./CY

## 2015-08-28 ENCOUNTER — Encounter (HOSPITAL_COMMUNITY): Payer: Self-pay | Admitting: *Deleted

## 2015-08-28 ENCOUNTER — Encounter (HOSPITAL_COMMUNITY)
Admission: RE | Disposition: A | Discharge status: Home / Self Care | Payer: Self-pay | Source: Ambulatory Visit | Attending: Cardiology

## 2015-08-28 ENCOUNTER — Other Ambulatory Visit: Payer: Self-pay | Admitting: Interventional Cardiology

## 2015-08-28 ENCOUNTER — Ambulatory Visit (HOSPITAL_COMMUNITY)
Admission: RE | Admit: 2015-08-28 | Discharge: 2015-08-28 | Disposition: A | Discharge status: Home / Self Care | Payer: Medicare Other | Source: Ambulatory Visit | Attending: Cardiology | Admitting: Cardiology

## 2015-08-28 ENCOUNTER — Ambulatory Visit (HOSPITAL_COMMUNITY): Payer: Medicare Other | Admitting: Certified Registered"

## 2015-08-28 DIAGNOSIS — I480 Paroxysmal atrial fibrillation: Secondary | ICD-10-CM | POA: Insufficient documentation

## 2015-08-28 DIAGNOSIS — I481 Persistent atrial fibrillation: Secondary | ICD-10-CM | POA: Insufficient documentation

## 2015-08-28 DIAGNOSIS — Z7901 Long term (current) use of anticoagulants: Secondary | ICD-10-CM | POA: Insufficient documentation

## 2015-08-28 DIAGNOSIS — Z7984 Long term (current) use of oral hypoglycemic drugs: Secondary | ICD-10-CM | POA: Diagnosis not present

## 2015-08-28 DIAGNOSIS — Z7982 Long term (current) use of aspirin: Secondary | ICD-10-CM | POA: Diagnosis not present

## 2015-08-28 DIAGNOSIS — I5022 Chronic systolic (congestive) heart failure: Secondary | ICD-10-CM | POA: Diagnosis not present

## 2015-08-28 DIAGNOSIS — I4819 Other persistent atrial fibrillation: Secondary | ICD-10-CM

## 2015-08-28 DIAGNOSIS — I10 Essential (primary) hypertension: Secondary | ICD-10-CM | POA: Diagnosis not present

## 2015-08-28 DIAGNOSIS — E119 Type 2 diabetes mellitus without complications: Secondary | ICD-10-CM | POA: Insufficient documentation

## 2015-08-28 DIAGNOSIS — Z79899 Other long term (current) drug therapy: Secondary | ICD-10-CM | POA: Diagnosis not present

## 2015-08-28 DIAGNOSIS — I110 Hypertensive heart disease with heart failure: Secondary | ICD-10-CM | POA: Insufficient documentation

## 2015-08-28 DIAGNOSIS — I4891 Unspecified atrial fibrillation: Secondary | ICD-10-CM | POA: Diagnosis present

## 2015-08-28 HISTORY — PX: CARDIOVERSION: SHX1299

## 2015-08-28 LAB — GLUCOSE, CAPILLARY: GLUCOSE-CAPILLARY: 126 mg/dL — AB (ref 65–99)

## 2015-08-28 SURGERY — CARDIOVERSION
Anesthesia: Monitor Anesthesia Care

## 2015-08-28 MED ORDER — SODIUM CHLORIDE 0.9 % IV SOLN
INTRAVENOUS | Status: DC | PRN
  Administered 2015-08-28: 13:00:00 via INTRAVENOUS

## 2015-08-28 MED ORDER — ETOMIDATE 2 MG/ML IV SOLN
INTRAVENOUS | Status: DC | PRN
  Administered 2015-08-28: 10 mg via INTRAVENOUS

## 2015-08-28 NOTE — Discharge Instructions (Signed)
Electrical Cardioversion, Care After °Refer to this sheet in the next few weeks. These instructions provide you with information on caring for yourself after your procedure. Your health care provider may also give you more specific instructions. Your treatment has been planned according to current medical practices, but problems sometimes occur. Call your health care provider if you have any problems or questions after your procedure. °WHAT TO EXPECT AFTER THE PROCEDURE °After your procedure, it is typical to have the following sensations: °· Some redness on the skin where the shocks were delivered. If this is tender, a sunburn lotion or hydrocortisone cream may help. °· Possible return of an abnormal heart rhythm within hours or days after the procedure. °HOME CARE INSTRUCTIONS °· Take medicines only as directed by your health care provider. Be sure you understand how and when to take your medicine. °· Learn how to feel your pulse and check it often. °· Limit your activity for 48 hours after the procedure or as directed by your health care provider. °· Avoid or minimize caffeine and other stimulants as directed by your health care provider. °SEEK MEDICAL CARE IF: °· You feel like your heart is beating too fast or your pulse is not regular. °· You have any questions about your medicines. °· You have bleeding that will not stop. °SEEK IMMEDIATE MEDICAL CARE IF: °· You are dizzy or feel faint. °· It is hard to breathe or you feel short of breath. °· There is a change in discomfort in your chest. °· Your speech is slurred or you have trouble moving an arm or leg on one side of your body. °· You get a serious muscle cramp that does not go away. °· Your fingers or toes turn cold or blue. °  °This information is not intended to replace advice given to you by your health care provider. Make sure you discuss any questions you have with your health care provider. °  °Document Released: 01/19/2013 Document Revised: 04/21/2014  Document Reviewed: 01/19/2013 °Elsevier Interactive Patient Education ©2016 [employer] ° °

## 2015-08-28 NOTE — Procedures (Signed)
Electrical Cardioversion Procedure Note [name] Mcmeans [id number] February 17, 1941  Procedure: Electrical Cardioversion Indications:  Atrial Fibrillation  Procedure Details Consent: Risks of procedure as well as the alternatives and risks of each were explained to the (patient/caregiver).  Consent for procedure obtained. Time Out: Verified patient identification, verified procedure, site/side was marked, verified correct patient position, special equipment/implants available, medications/allergies/relevent history reviewed, required imaging and test results available.  Performed  Patient placed on cardiac monitor, pulse oximetry, supplemental oxygen as necessary.  Sedation given: Patient sedated by anesthesia with etomidate 10 mg IV. Pacer pads placed anterior and posterior chest.  Cardioverted 1 time(s).  Cardioverted at 120J.  Evaluation Findings: Post procedure EKG shows: NSR Complications: None Patient did tolerate procedure well.   Olga MillersBrian Crenshaw 08/28/2015, 12:11 PM

## 2015-08-28 NOTE — Anesthesia Preprocedure Evaluation (Addendum)
Anesthesia Evaluation  Patient identified by MRN, date of birth, ID band Patient awake    Reviewed: Allergy & Precautions, NPO status , Patient's Chart, lab work & pertinent test results, reviewed documented beta blocker date and time   Airway Mallampati: II  TM Distance: >3 FB Neck ROM: Full    Dental  (+) Teeth Intact   Pulmonary neg pulmonary ROS,    breath sounds clear to auscultation       Cardiovascular hypertension, Pt. on medications and Pt. on home beta blockers +CHF   Rhythm:Irregular Rate:Abnormal     Neuro/Psych negative neurological ROS  negative psych ROS   GI/Hepatic negative GI ROS, Neg liver ROS,   Endo/Other  diabetes, Type 2, Oral Hypoglycemic Agents  Renal/GU negative Renal ROS  negative genitourinary   Musculoskeletal negative musculoskeletal ROS (+)   Abdominal   Peds negative pediatric ROS (+)  Hematology negative hematology ROS (+)   Anesthesia Other Findings   Reproductive/Obstetrics negative OB ROS                            Anesthesia Physical Anesthesia Plan  ASA: III  Anesthesia Plan: MAC   Post-op Pain Management:    Induction: Intravenous  Airway Management Planned: Nasal Cannula  Additional Equipment:   Intra-op Plan:   Post-operative Plan:   Informed Consent: I have reviewed the patients History and Physical, chart, labs and discussed the procedure including the risks, benefits and alternatives for the proposed anesthesia with the patient or authorized representative who has indicated his/her understanding and acceptance.   Dental advisory given  Plan Discussed with: CRNA  Anesthesia Plan Comments:         Anesthesia Quick Evaluation

## 2015-08-28 NOTE — H&P (Signed)
Jason [name]  08/10/2015 2:30 PM  Office Visit  MRN:  [id number]   Description: Male DOB: [birth year]  Provider: Lyn RecordsHenry W Smith, MD  Department: [address] [facility]       Vital Signs  Most recent update: 08/10/2015 2:27 PM by Drue Dunhelsie S Lowe, CMA    BP Pulse Ht Wt BMI    114/90 mmHg 80 5\' 11"  (1.803 m) 214 lb 6.4 oz (97.251 kg) 29.92 kg/m2    Vitals History     Progress Notes      Lyn RecordsHenry W Smith, MD at 08/10/2015 2:59 PM     Status: Signed       Expand All Collapse All      Cardiology Office Note   Date: 08/10/2015   ID: Jason MerinoLewis S [name], DOB [birth year], MRN [id number]  PCP: Tanna FurryHOUT, BRITTANY, PA-C Cardiologist: Lesleigh NoeSMITH III,Jason W, MD   Chief Complaint  Patient presents with  . Congestive Heart Failure  . Atrial Fibrillation     History of Present Illness: Jason [name] is a 75 y.o. male who presents for Follow-up of acute on chronic systolic heart failure (LVEF by echo 20-25%), atrial fibrillation of unknown duration, hypertension, and silent coronary disease to noted by calcium on CT scan.  The weight on his home scales is running between 217 and 218 pounds and has been stable since discharge. He feels great. His daughter is a here with him today. There was a full explanation of his current status. He denies chest pain. Lower extremity swelling is resolved. As noted above weight is been stable. No bleeding.  Past Medical History  Diagnosis Date  . Diabetes mellitus without complication (HCC)   . Hypertension   . Atrial [facility](H[facility])     Past Surgical History  Procedure Laterality Date  . Hernia repair    . Kidney stone surgery       Current Outpatient Prescriptions  Medication Sig Dispense Refill  . amiodarone (PACERONE) 200 MG tablet Take 1 tablet (200 mg total) by mouth 2 (two) times daily. 60 tablet 1  . apixaban (ELIQUIS) 5 MG TABS tablet Take 1 tablet (5 mg total) by mouth 2 (two)  times daily. 60 tablet 1  . aspirin 81 MG tablet Take 1 tablet (81 mg total) by mouth daily. 30 tablet 0  . atorvastatin (LIPITOR) 40 MG tablet Take 1 tablet (40 mg total) by mouth daily at 6 PM. 30 tablet 1  . carvedilol (COREG) 12.5 MG tablet Take 1 tablet (12.5 mg total) by mouth 2 (two) times daily with a meal. 60 tablet 1  . furosemide (LASIX) 40 MG tablet Take 1 tablet (40 mg total) by mouth daily. 30 tablet 1  . lisinopril (PRINIVIL,ZESTRIL) 5 MG tablet Take 1 tablet (5 mg total) by mouth daily. 30 tablet 1  . metFORMIN (GLUCOPHAGE) 500 MG tablet Take 500 mg by mouth daily with breakfast.    . Multiple Vitamins-Minerals (MULTIVITAMIN WITH MINERALS) tablet Take 1 tablet by mouth daily.     No current facility-administered medications for this visit.    Allergies: Review of patient's allergies indicates no known allergies.    Social History: The patient  reports that he has never smoked. He does not have any smokeless tobacco history on file. He reports that he does not drink alcohol or use illicit drugs.   Family History: The patient's family history is not on file.    ROS: Please see the history of present illness. Otherwise, review of systems are positive for  no specific complaints. All other systems are reviewed and negative.    PHYSICAL EXAM: VS: BP 114/90 mmHg  Pulse 80  Ht  (1.803 m)  Wt 214 lb 6.4 oz (97.251 kg)  BMI 29.92 kg/m2 , BMI Body mass index is 29.92 kg/(m^2). GEN: Well nourished, well developed, in no acute distress  HEENT: normal  Neck: no JVD, carotid bruits, or masses Cardiac: IIRR. There is no murmur, rub, or gallop. There is no edema. Respiratory: clear to auscultation bilaterally, normal work of breathing. GI: soft, nontender, nondistended, + BS MS: no deformity or atrophy  Skin: warm and dry, no rash Neuro: Strength and sensation are intact Psych: euthymic mood, full affect   EKG: EKG  is not ordered today.    Recent Labs: 07/29/2015: ALT 20; B Natriuretic Peptide 360.6*; TSH 1.434 07/30/2015: Hemoglobin 16.4; Platelets 177 08/01/2015: BUN 24*; Creatinine, Ser 1.14; Potassium 4.2; Sodium 139    Lipid Panel  Labs (Brief)       Component Value Date/Time   CHOL 130 07/29/2015 1857   TRIG 61 07/29/2015 1857   HDL 30* 07/29/2015 1857   CHOLHDL 4.3 07/29/2015 1857   VLDL 12 07/29/2015 1857   LDLCALC 88 07/29/2015 1857       Wt Readings from Last 3 Encounters:  08/10/15 214 lb 6.4 oz (97.251 kg)  08/02/15 217 lb 14.4 oz (98.839 kg)      Other studies Reviewed: Additional studies/ records that were reviewed today include: Reviewed echo report from the hospital stay. The findings include:. Echocardiogram 07/31/15 Study Conclusions  - Left ventricle: The cavity size was normal. There was moderate  concentric hypertrophy. Systolic function was severely reduced.  The estimated ejection fraction was in the range of 20% to 25%.  Diffuse hypokinesis. - Aortic valve: There was trivial regurgitation. - Mitral valve: Calcified annulus. - Left atrium: The atrium was moderately dilated. - Right ventricle: The cavity size was mildly dilated. Wall  thickness was normal. - Right atrium: The atrium was moderately dilated. - Tricuspid valve: There was trivial regurgitation. - Pulmonary arteries: Systolic pressure was mildly increased. PA  peak pressure: 39 mm Hg (S).  ASSESSMENT AND PLAN:  1. Persistent atrial fibrillation (HCC) Heart rate is controlled. We'll need to now set the patient up for elective cardioversion. This will be done after we have had him on anticoagulation therapy for at least 3 weeks.  2. Essential hypertension Controlled.  3. Acute on chronic systolic heart failure (HCC) No volume overload  4. Coronary artery calcification seen on CT scan The knee myocardial perfusion study or cath after O  cardioversion    Current medicines are reviewed at length with the patient today. The patient has the following concerns regarding medicines: None.  The following changes/actions have been instituted:   Decrease amiodarone to 200 mg per day starting one week prior to cardioversion  Cardioversion in 3 weeks  Follow-up with me in one month  EKG and BMET/CBC as preop prior to cardioversion  Procedure and risks was discussed with the patient and his daughters. The risk includes the possibility of CVA, anesthesia side effects, mechanical injury at the time of shot, skin injury, dental injury, potential pacemaker therapy, and cardiac arrest.    abs/ tests ordered today include:  No orders of the defined types were placed in this encounter.    Disposition: FU with HS in 1 month  Signed, Lesleigh Noe, MD  08/10/2015 2:59 PM  [facility] [address], [place], [place] [address]       For DCCV; no changes; patient compliant with apixaban. Olga Millers

## 2015-08-28 NOTE — Anesthesia Postprocedure Evaluation (Signed)
Anesthesia Post Note  Patient: [name] Heninger  Procedure(s) Performed: Procedure(s) (LRB): CARDIOVERSION (N/A)  Patient location during evaluation: PACU Anesthesia Type: MAC Level of consciousness: awake and alert Pain management: pain level controlled Vital Signs Assessment: post-procedure vital signs reviewed and stable Respiratory status: spontaneous breathing, nonlabored ventilation, respiratory function stable and patient connected to nasal cannula oxygen Cardiovascular status: stable and blood pressure returned to baseline Anesthetic complications: no    Last Vitals:  Filed Vitals:   08/28/15 1320 08/28/15 1330  BP: 131/78 129/70  Pulse: 59 53  Temp:    Resp: 17 12    Last Pain: There were no vitals filed for this visit.               Shelton SilvasKevin D 

## 2015-08-28 NOTE — Transfer of Care (Signed)
Immediate Anesthesia Transfer of Care Note  Patient: Jason [name]  Procedure(s) Performed: Procedure(s): CARDIOVERSION (N/A)  Patient Location: PACU and Endoscopy Unit  Anesthesia Type:MAC  Level of Consciousness: awake, alert , oriented and patient cooperative  Airway & Oxygen Therapy: Patient Spontanous Breathing and Patient connected to nasal cannula oxygen  Post-op Assessment: Report given to RN, Post -op Vital signs reviewed and stable and Patient moving all extremities  Post vital signs: Reviewed and stable  Last Vitals:  Filed Vitals:   08/28/15 1306 08/28/15 1307  BP:    Pulse: 66 65  Temp:    Resp: 17 18    Last Pain: There were no vitals filed for this visit.       Complications: No apparent anesthesia complications

## 2015-08-29 ENCOUNTER — Encounter (HOSPITAL_COMMUNITY): Payer: Self-pay | Admitting: Cardiology

## 2015-08-29 DIAGNOSIS — Z6829 Body mass index (BMI) 29.0-29.9, adult: Secondary | ICD-10-CM | POA: Diagnosis not present

## 2015-08-29 DIAGNOSIS — Z09 Encounter for follow-up examination after completed treatment for conditions other than malignant neoplasm: Secondary | ICD-10-CM | POA: Diagnosis not present

## 2015-09-03 ENCOUNTER — Telehealth: Payer: Self-pay | Admitting: Interventional Cardiology

## 2015-09-03 NOTE — Telephone Encounter (Signed)
Returned pt daughter Renee PainBarbara's call.  She sts that the pt has had 2-3 nose bleeds in the last 1 week. Pt is able to get the bleeding to stop after a couple of minute of applying pressure. Durwin GlazeAdv Barbara that pt could try keeping his nasal passageway moist with saline nasal drops, or placing a little vaseline on a q-tip and rubbing it in his nostrils. Pt should go to a urgent care/ or pcp for nose bleed that they are not able to stop, so his nose can be packed. Britta MccreedyBarbara verbalized understanding and voiced appreciation for the call back

## 2015-09-03 NOTE — Telephone Encounter (Signed)
New Message:   Pt is on Eliquis,his nose have been bleeding since last week off and on.Please call to advise.

## 2015-09-20 ENCOUNTER — Encounter: Payer: Self-pay | Admitting: Cardiology

## 2015-09-20 ENCOUNTER — Ambulatory Visit (INDEPENDENT_AMBULATORY_CARE_PROVIDER_SITE_OTHER): Payer: Medicare Other | Admitting: Cardiology

## 2015-09-20 VITALS — BP 116/60 | HR 56 | Ht 71.0 in | Wt 213.1 lb

## 2015-09-20 DIAGNOSIS — I2510 Atherosclerotic heart disease of native coronary artery without angina pectoris: Secondary | ICD-10-CM | POA: Diagnosis not present

## 2015-09-20 MED ORDER — CARVEDILOL 6.25 MG PO TABS: 6.2500 mg | ORAL_TABLET | Freq: Two times a day (BID) | ORAL | Status: DC

## 2015-09-20 NOTE — Progress Notes (Signed)
09/20/2015 Jason [name]   07/29/1940  [id number]  Primary Physician Tanna Furry, PA-C Primary Cardiologist: Dr. Katrinka Blazing   Reason for Visit/CC: F/u for atrial fibrillation; s/p DCCV  HPI:  The patient is a 75 year old male, followed by Dr. Katrinka Blazing, who presents to clinic today for post procedural follow-up. He has a history of atrial fibrillation and was recently scheduled for an outpatient cardioversion which was performed at [facility] on 08/28/2015. This was performed by Dr. Jens Som. He successfully cardioverted at 120 J. 's procedure EKG showed normal sinus rhythm. He tolerated the procedure well and was discharged home the same day. His other cardiac history is significant for chronic systolic heart failure which is felt to be related to his atrial fibrillation (tachycardia induced). Also hypertension and chronic anticoagulation therapy with apixaban. There is also concern for CAD based on the presence of coronary artery calcifications seen on recent chest CP.  He now presents back to clinic for follow-up. He is accompanied by his daughter. He reports that he has done well. He denies any signs or symptoms of breakthrough atrial fibrillation. He denies any further dyspnea. No chest pain, dizziness, palpitations, syncope/near-syncope. He has been fully compliant with his medications. At 2 episodes of epistaxes but denies any further recurrence. He denies any melena. No falls.  EKG today shows sinus bradycardia. Heart rate is 47 bpm. Medications have been reviewed with the patient. He is taking amiodarone 200 mg daily along with carvedilol 12.5 mg twice a day.   Current Outpatient Prescriptions  Medication Sig Dispense Refill  . amiodarone (PACERONE) 200 MG tablet Take 200 mg by mouth daily.    Marland Kitchen apixaban (ELIQUIS) 5 MG TABS tablet Take 1 tablet (5 mg total) by mouth 2 (two) times daily. 60 tablet 1  . aspirin 81 MG tablet Take 1 tablet (81 mg total) by mouth daily. 30 tablet 0  .  atorvastatin (LIPITOR) 40 MG tablet Take 1 tablet (40 mg total) by mouth daily at 6 PM. 30 tablet 1  . furosemide (LASIX) 40 MG tablet Take 1 tablet (40 mg total) by mouth daily. 30 tablet 1  . lisinopril (PRINIVIL,ZESTRIL) 5 MG tablet Take 1 tablet (5 mg total) by mouth daily. 30 tablet 1  . metFORMIN (GLUCOPHAGE) 500 MG tablet Take 500 mg by mouth daily with breakfast.    . Multiple Vitamins-Minerals (MULTIVITAMIN WITH MINERALS) tablet Take 1 tablet by mouth daily.    . carvedilol (COREG) 6.25 MG tablet Take 1 tablet (6.25 mg total) by mouth 2 (two) times daily. 60 tablet 11   No current facility-administered medications for this visit.    No Known Allergies  Social History   Social History  . Marital Status: Married    Spouse Name: N/A  . Number of Children: N/A  . Years of Education: N/A   Occupational History  . Not on file.   Social History Main Topics  . Smoking status: Never Smoker   . Smokeless tobacco: Not on file  . Alcohol Use: No  . Drug Use: No  . Sexual Activity: Not on file   Other Topics Concern  . Not on file   Social History Narrative     Review of Systems: General: negative for chills, fever, night sweats or weight changes.  Cardiovascular: negative for chest pain, dyspnea on exertion, edema, orthopnea, palpitations, paroxysmal nocturnal dyspnea or shortness of breath Dermatological: negative for rash Respiratory: negative for cough or wheezing Urologic: negative for hematuria Abdominal: negative for nausea,  vomiting, diarrhea, bright red blood per rectum, melena, or hematemesis Neurologic: negative for visual changes, syncope, or dizziness All other systems reviewed and are otherwise negative except as noted above.    Blood pressure 116/60, pulse 56, height 5\' 11"  (1.803 m), weight 213 lb 1.9 oz (96.671 kg).  General appearance: alert, cooperative and no distress Neck: no carotid bruit and no JVD Lungs: clear to auscultation bilaterally Heart:  regular rate and rhythm, S1, S2 normal, no murmur, click, rub or gallop Extremities: no LEE Pulses: 2+ and symmetric Skin: warm and dry Neurologic: Grossly normal  EKG Marked sinus bradycardia. 47 bpm.   ASSESSMENT AND PLAN:   1. Paroxysmal atrial fibrillation: Patient is status post successful direct current cardioversion. He is maintaining normal sinus rhythm with amiodarone and carvedilol. EKG today shows market sinus bradycardia with a heart rate of 47 bpm. He is asymptomatic. He has been instructed to continue 200 mg of amiodarone daily. We will reduce his carvedilol dose down to 6.25 mg twice a day. He has been instructed to continue to monitor his rate closely at home. We can further titrate down his carvedilol to 3.125 mg if needed. He is to continue anticoagulation therapy with Eliquis.   2. Hypertension: Blood pressure is well controlled to 116/60.  3. Systolic Heart Failure: EF is 04-54%20-25%. This is felt to be possibly tachy mediated from his atrial fibrillation. He is on appropriate medical therapy with a beta blocker, ACE inhibitor and a diuretic. His is stable and he is euvolemic with Lasix. He denies any dyspnea, orthopnea, PND or lower extremity edema. Given market bradycardia we'll reduce his carvedilol down to 6.25 g twice a day.  4. Coronary artery calcification seen on CT scan: In office note 08/10/2015, Dr. Katrinka BlazingSmith outlined that he wanted the patient to undergo either a myocardial perfusion study or cath following his cardioversion, to rule out underlying coronary artery disease given recent CT scan findings. The patient denies any anginal symptomatology including no chest pain and no exertional dyspnea. For this reason we will opt for noninvasive nuclear stress testing to risk stratify. Continue medical therapy with beta blocker and statin.  PLAN  F/u with Dr. Katrinka BlazingSmith in 4-6 weeks.   Robbie LisBrittainy  PA-C 09/20/2015 11:47 AM

## 2015-09-20 NOTE — Patient Instructions (Signed)
Your physician has recommended you make the following change in your medication:   1.) the carvedilol dose has been changed to 6.25 mg twice a day. A new prescription has been sent to your [facility] pharmacy to reflect this change.  Your physician has requested that you have a lexiscan myoview. For further information please visit [email or web address]. Please follow instruction sheet, as given.  Your physician recommends that you schedule a follow-up appointment in: 6 weeks with Dr Katrinka BlazingSmith.

## 2015-09-22 ENCOUNTER — Other Ambulatory Visit: Payer: Self-pay | Admitting: Student

## 2015-09-25 ENCOUNTER — Telehealth: Payer: Self-pay | Admitting: *Deleted

## 2015-09-25 ENCOUNTER — Telehealth (HOSPITAL_COMMUNITY): Payer: Self-pay | Admitting: *Deleted

## 2015-09-25 ENCOUNTER — Telehealth (HOSPITAL_COMMUNITY): Payer: Self-pay | Admitting: Surgery

## 2015-09-25 NOTE — Telephone Encounter (Signed)
Jason [name] called and asked about refills for his Lasix and Atorvastatin.  He had appt on 09/20/15 at [facility] and did not mention to anyone at that time.  I asked him to please call [facility] and let them know that he needs refills.  He is aware and agreeable.

## 2015-09-25 NOTE — Telephone Encounter (Signed)
[place]any received a call from patient Mr. Jason [name] who was discharged from the hospital on 08/02/15 and was last seen by you and he is out of his Atorvastatin 40 mg and lasix 40 mg is it ok to refill for this patient ??

## 2015-09-25 NOTE — Telephone Encounter (Signed)
Left message on voicemail per DPR in reference to upcoming appointment scheduled on 01/01/16 at 0945 with detailed instructions given per Myocardial Perfusion Study Information Sheet for the test. LM to arrive 15 minutes early, and that it is imperative to arrive on time for appointment to keep from having the test rescheduled. If you need to cancel or reschedule your appointment, please call the office within 24 hours of your appointment. Failure to do so may result in a cancellation of your appointment, and a $50 no show fee. Phone number given for call back for any questions. , Adelene IdlerCynthia W

## 2015-09-26 ENCOUNTER — Other Ambulatory Visit: Payer: Self-pay | Admitting: Interventional Cardiology

## 2015-09-26 MED ORDER — ATORVASTATIN CALCIUM 40 MG PO TABS: 40.0000 mg | ORAL_TABLET | Freq: Every day | ORAL | Status: DC

## 2015-09-26 MED ORDER — FUROSEMIDE 40 MG PO TABS: 40.0000 mg | ORAL_TABLET | Freq: Every day | ORAL | Status: DC

## 2015-10-01 ENCOUNTER — Ambulatory Visit (HOSPITAL_COMMUNITY): Payer: Medicare Other | Attending: Internal Medicine

## 2015-10-01 DIAGNOSIS — R9439 Abnormal result of other cardiovascular function study: Secondary | ICD-10-CM | POA: Insufficient documentation

## 2015-10-01 DIAGNOSIS — I110 Hypertensive heart disease with heart failure: Secondary | ICD-10-CM | POA: Insufficient documentation

## 2015-10-01 DIAGNOSIS — I5022 Chronic systolic (congestive) heart failure: Secondary | ICD-10-CM | POA: Diagnosis not present

## 2015-10-01 DIAGNOSIS — I2510 Atherosclerotic heart disease of native coronary artery without angina pectoris: Secondary | ICD-10-CM | POA: Diagnosis not present

## 2015-10-01 DIAGNOSIS — E119 Type 2 diabetes mellitus without complications: Secondary | ICD-10-CM | POA: Diagnosis not present

## 2015-10-01 LAB — MYOCARDIAL PERFUSION IMAGING
CHL CUP NUCLEAR SDS: 3
CHL CUP NUCLEAR SRS: 8
CHL CUP NUCLEAR SSS: 11
LV sys vol: 53 mL
LVDIAVOL: 124 mL (ref 62–150)
NUC STRESS TID: 0.97
Peak HR: 68 {beats}/min
RATE: 0.31
Rest HR: 50 {beats}/min

## 2015-10-01 MED ORDER — REGADENOSON 0.4 MG/5ML IV SOLN
0.4000 mg | Freq: Once | INTRAVENOUS | Status: AC
  Administered 2015-10-01: 0.4 mg via INTRAVENOUS

## 2015-10-01 MED ORDER — TECHNETIUM TC 99M TETROFOSMIN IV KIT
10.5000 | PACK | Freq: Once | INTRAVENOUS | Status: AC | PRN
  Administered 2015-10-01: 11 via INTRAVENOUS
  Filled 2015-10-01: qty 11

## 2015-10-01 MED ORDER — TECHNETIUM TC 99M TETROFOSMIN IV KIT
31.9000 | PACK | Freq: Once | INTRAVENOUS | Status: AC | PRN
  Administered 2015-10-01: 31.9 via INTRAVENOUS
  Filled 2015-10-01: qty 32

## 2015-10-03 ENCOUNTER — Telehealth: Payer: Self-pay | Admitting: Interventional Cardiology

## 2015-10-03 NOTE — Telephone Encounter (Signed)
Pt has been made aware of stress test results and he verbalized understanding.

## 2015-10-03 NOTE — Telephone Encounter (Signed)
F/u Message  Pt returning call to RN about stress test results. Please call back to discuss

## 2015-10-29 ENCOUNTER — Ambulatory Visit (INDEPENDENT_AMBULATORY_CARE_PROVIDER_SITE_OTHER): Payer: Medicare Other | Admitting: Physician Assistant

## 2015-10-29 ENCOUNTER — Encounter: Payer: Self-pay | Admitting: Physician Assistant

## 2015-10-29 ENCOUNTER — Encounter: Payer: Self-pay | Admitting: *Deleted

## 2015-10-29 ENCOUNTER — Other Ambulatory Visit: Payer: Self-pay | Admitting: *Deleted

## 2015-10-29 VITALS — BP 122/68 | HR 80 | Ht 71.0 in | Wt 209.6 lb

## 2015-10-29 DIAGNOSIS — R9439 Abnormal result of other cardiovascular function study: Secondary | ICD-10-CM

## 2015-10-29 DIAGNOSIS — I10 Essential (primary) hypertension: Secondary | ICD-10-CM | POA: Diagnosis not present

## 2015-10-29 DIAGNOSIS — I2510 Atherosclerotic heart disease of native coronary artery without angina pectoris: Secondary | ICD-10-CM | POA: Diagnosis not present

## 2015-10-29 DIAGNOSIS — I429 Cardiomyopathy, unspecified: Secondary | ICD-10-CM | POA: Insufficient documentation

## 2015-10-29 DIAGNOSIS — I4891 Unspecified atrial fibrillation: Secondary | ICD-10-CM | POA: Diagnosis not present

## 2015-10-29 DIAGNOSIS — R931 Abnormal findings on diagnostic imaging of heart and coronary circulation: Secondary | ICD-10-CM

## 2015-10-29 MED ORDER — APIXABAN 5 MG PO TABS: 5.0000 mg | ORAL_TABLET | Freq: Two times a day (BID) | ORAL | Status: DC

## 2015-10-29 MED ORDER — AMIODARONE HCL 200 MG PO TABS: 200.0000 mg | ORAL_TABLET | Freq: Every day | ORAL | Status: DC

## 2015-10-29 MED ORDER — LISINOPRIL 5 MG PO TABS: 5.0000 mg | ORAL_TABLET | Freq: Every day | ORAL | Status: DC

## 2015-10-29 NOTE — Patient Instructions (Addendum)
Medication Instructions:   Your physician recommends that you continue on your current medications as directed. Please refer to the Current Medication list given to you today.   If you need a refill on your cardiac medications before your next appointment, please call your pharmacy.  Labwork: RETURN FOR LAB WORK ON  11/12/2015  OR 8/ 04/2015....   Testing/Procedures: SEE LETTER FOR L. HEART CATH ON 11/16/2015..   Follow-Up:  2 WEEKS POST CATH AFTER 11/16/2015 WITH EXTENDER   2 MONTHS  WITH DR Katrinka BlazingSMITH  AFTER 11/16/2015..   Any Other Special Instructions Will Be Listed Below (If Applicable).

## 2015-10-29 NOTE — Progress Notes (Signed)
Cardiology Office Note    Date:  10/29/2015   ID:  Jason [name], DOB Jan 24, 1941, MRN [id number]  PCP:  Jason Furry, PA-C  Cardiologist: Dr. Katrinka [name]  Chief complaint follow-up of atrial fibrillation  History of Present Illness:  Jason [name] is a 75 y.o. male with history of paroxysmal atrial fibrillation status post DC cardioversion 08/28/15. Patient also has history of chronic systolic heart failure EF 20-25% which is felt to be related to his atrial fibrillation tachycardia induced, hypertension and chronic anticoagulation with abixaban. There is also concern for CAD based on the presence of coronary artery calcification seen on recent chest CT. Lasts seen by Jason Medici, PA 09/20/15 and had sinus bradycardia at 47 bpm he was on amiodarone 200 mg daily along with carvedilol 12.5 mg twice a day. Carvedilol was reduced to 6.25 mg twice a day.  Nuclear stress test was performed on 10/01/15 which is a low risk study EF 55% there was a small defect of moderate severity in the mid anterior septal and apical septal location. Mostly infarct with very mild peri-infarct ischemia. There was a small defect of moderate severity in the apical lateral location consistent with infarct. Dr. Katrinka [name] reviewed his stress test in chart in detail recommends the patient have a cardiac catheterization.  Patient has a [place] house and goes up and down multiple stairs without difficulty. Since his cardioversion he denies any chest pain, palpitations, dyspnea, dyspnea on exertion, dizziness or presyncope. Prior to cardioversion he was having a lot of dyspnea on exertion and orthopnea and edema.    Past Medical History  Diagnosis Date  . Diabetes mellitus without complication (HCC)   . Hypertension   . Atrial fibrillation [facility]     Past Surgical History  Procedure Laterality Date  . Hernia repair    . Kidney stone surgery    . Cardioversion N/A 08/28/2015    Procedure: CARDIOVERSION;  Surgeon: Jason Bunting, MD;  Location: [facility];  Service: Cardiovascular;  Laterality: N/A;    Current Medications: Outpatient Prescriptions Prior to Visit  Medication Sig Dispense Refill  . apixaban (ELIQUIS) 5 MG TABS tablet Take 1 tablet (5 mg total) by mouth 2 (two) times daily. 60 tablet 1  . atorvastatin (LIPITOR) 40 MG tablet Take 1 tablet (40 mg total) by mouth daily at 6 PM. 30 tablet 11  . carvedilol (COREG) 6.25 MG tablet Take 1 tablet (6.25 mg total) by mouth 2 (two) times daily. 60 tablet 11  . furosemide (LASIX) 40 MG tablet Take 1 tablet (40 mg total) by mouth daily. 30 tablet 11  . metFORMIN (GLUCOPHAGE) 500 MG tablet Take 500 mg by mouth daily with breakfast.    . Multiple Vitamins-Minerals (MULTIVITAMIN WITH MINERALS) tablet Take 1 tablet by mouth daily.    Marland Kitchen amiodarone (PACERONE) 200 MG tablet Take 200 mg by mouth daily.    Marland Kitchen lisinopril (PRINIVIL,ZESTRIL) 5 MG tablet Take 1 tablet (5 mg total) by mouth daily. 30 tablet 1  . aspirin 81 MG tablet Take 1 tablet (81 mg total) by mouth daily. (Patient not taking: Reported on 10/29/2015) 30 tablet 0   No facility-administered medications prior to visit.     Allergies:   Review of patient's allergies indicates no known allergies.   Social History   Social History  . Marital Status: Married    Spouse Name: N/A  . Number of Children: N/A  . Years of Education: N/A   Social History Main Topics  .  Smoking status: Never Smoker   . Smokeless tobacco: None  . Alcohol Use: No  . Drug Use: No  . Sexual Activity: Not Asked   Other Topics Concern  . None   Social History Narrative     Family History:  The patient's family history is negative for Heart attack and Hypertension.   ROS:   Please see the history of present illness.    Review of Systems  Constitution: Negative.  HENT: Negative.   Cardiovascular: Negative.   Respiratory: Negative.   Endocrine: Negative.   Hematologic/Lymphatic: Negative.   Musculoskeletal:  Negative.   Gastrointestinal: Negative.   Genitourinary: Negative.   Neurological: Negative.    All other systems reviewed and are negative.   PHYSICAL EXAM:   VS:  BP 122/68 mmHg  Pulse 80  Ht  (1.803 m)  Wt 209 lb 9.6 oz (95.074 kg)  BMI 29.25 kg/m2  Physical Exam  GEN: Well nourished, well developed, in no acute distress HEENT: normal Neck: no JVD, carotid bruits, or masses Cardiac:RRR; no murmurs, rubs, or gallops  Respiratory:  clear to auscultation bilaterally, normal work of breathing GI: soft, nontender, nondistended, + BS Ext: without cyanosis, clubbing, or edema, Good distal pulses bilaterally MS: no deformity or atrophy Skin: warm and dry, no rash Neuro:  Alert and Oriented x 3, Strength and sensation are intact Psych: euthymic mood, full affect  Wt Readings from Last 3 Encounters:  10/29/15 209 lb 9.6 oz (95.074 kg)  09/20/15 213 lb 1.9 oz (96.671 kg)  08/10/15 214 lb 6.4 oz (97.251 kg)      Studies/Labs Reviewed:   EKG:  EKG is ordered today.  The ekg ordered today demonstrates Normal sinus rhythm at 60 bpm incomplete right bundle branch block  Recent Labs: 07/29/2015: ALT 20; B Natriuretic Peptide 360.6*; TSH 1.434 08/27/2015: BUN 23; Creat 1.19*; Hemoglobin 16.8; Platelets 177; Potassium 4.1; Sodium 140   Lipid Panel    Component Value Date/Time   CHOL 130 07/29/2015 1857   TRIG 61 07/29/2015 1857   HDL 30* 07/29/2015 1857   CHOLHDL 4.3 07/29/2015 1857   VLDL 12 07/29/2015 1857   LDLCALC 88 07/29/2015 1857    Additional studies/ records that were reviewed today include:  2-D echo 07/2015 Study Conclusions   - Left ventricle: The cavity size was normal. There was moderate   concentric hypertrophy. Systolic function was severely reduced.   The estimated ejection fraction was in the range of 20% to 25%.   Diffuse hypokinesis. - Aortic valve: There was trivial regurgitation. - Mitral valve: Calcified annulus. - Left atrium: The atrium was  moderately dilated. - Right ventricle: The cavity size was mildly dilated. Wall   thickness was normal. - Right atrium: The atrium was moderately dilated. - Tricuspid valve: There was trivial regurgitation. - Pulmonary arteries: Systolic pressure was mildly increased. PA   peak pressure: 39 mm Hg (S).    Nuclear stress test 09/2015 Study Highlights      Nuclear stress EF: 57%.  There was no ST segment deviation noted during stress.  No T wave inversion was noted during stress.  Defect 1: There is a small defect of moderate severity present in the mid anteroseptal and apical septal location. Mostly infarct with very mild peri-infarct ischemia.  Defect 2: There is a small defect of moderate severity present in the apical lateral location. This is consistent with infarct.  This is a low risk study.  The left ventricular ejection fraction is normal (55-65%).  ASSESSMENT:    1. Abnormal nuclear stress test   2. Atrial fibrillation, unspecified type (HCC)   3. Coronary artery calcification seen on CT scan   4. Essential hypertension   5. Cardiomyopathy (HCC)      PLAN:  In order of problems listed above: Abnormal nuclear stress test as listed above. Reviewed in detail by Dr. Katrinka BlazingSmith who recommends cardiac catheterization. He thinks his atrial fibrillation may be secondary to CAD and ischemia. In light of his cardiomyopathy in April of this year EF 20-25% cath recommended. Risks and benefits discussed with patient and daughter in detail who are agreeable to proceed. He will have to hold his Eliquis 48 hours prior to cath and metformin 24 hours before cath. I have reviewed the risks, indications, and alternatives to angioplasty and stenting with the patient. Risks include but are not limited to bleeding, infection, vascular injury, stroke, myocardial infection, arrhythmia, kidney injury, radiation-related injury in the case of prolonged fluoroscopy use, emergency cardiac surgery,  and death. The patient understands the risks of serious complication is low (<1%) and he agrees to proceed.   Paroxysmal atrial fibrillation maintaining normal sinus rhythm heart rate is 60 today so we will continue carvedilol 6.25 mg twice a day  Coronary calcification on CT for cardiac catheterization with abnormal stress test  Essential hypertension controlled  Cardiomyopathy ejection fraction 20-25% on echo in April 2017 EF now 55-65% on nuclear stress test. Patient is feeling better and has no symptoms of heart failure.      Medication Adjustments/Labs and Tests Ordered: Current medicines are reviewed at length with the patient today.  Concerns regarding medicines are outlined above.  Medication changes, Labs and Tests ordered today are listed in the Patient Instructions below. Patient Instructions  Medication Instructions:     If you need a refill on your cardiac medications before your next appointment, please call your pharmacy.  Labwork:   Testing/Procedures:   Follow-Up:   Any Other Special Instructions Will Be Listed Below (If Applicable).                                                                                                                                                       Elson ClanSigned, Michele Lenze, PA-C  10/29/2015 10:23 AM    [facility] [address], [place], [place]  [address] Phone: [phone]; Fax: [phone]

## 2015-10-30 ENCOUNTER — Other Ambulatory Visit: Payer: Self-pay | Admitting: *Deleted

## 2015-10-30 MED ORDER — APIXABAN 5 MG PO TABS: 5.0000 mg | ORAL_TABLET | Freq: Two times a day (BID) | ORAL | Status: DC

## 2015-11-08 ENCOUNTER — Telehealth: Payer: Self-pay | Admitting: Interventional Cardiology

## 2015-11-08 NOTE — Telephone Encounter (Signed)
New Message  [employer] the manufactures for [employer] he was approved but they stated that it would be shipped to the doctors office and he wants the rn to call him to get an up date if it was received

## 2015-11-08 NOTE — Telephone Encounter (Signed)
Called patient to let him know Eliquis had arrived today. Had to leave a voicemail message, that it will be left up front.

## 2015-11-12 ENCOUNTER — Other Ambulatory Visit: Payer: Medicare Other | Admitting: *Deleted

## 2015-11-12 DIAGNOSIS — R931 Abnormal findings on diagnostic imaging of heart and coronary circulation: Secondary | ICD-10-CM | POA: Diagnosis not present

## 2015-11-12 DIAGNOSIS — R9439 Abnormal result of other cardiovascular function study: Secondary | ICD-10-CM

## 2015-11-12 DIAGNOSIS — Z7901 Long term (current) use of anticoagulants: Secondary | ICD-10-CM | POA: Diagnosis not present

## 2015-11-12 LAB — BASIC METABOLIC PANEL
BUN: 28 mg/dL — AB (ref 7–25)
CHLORIDE: 101 mmol/L (ref 98–110)
CO2: 30 mmol/L (ref 20–31)
Calcium: 9.8 mg/dL (ref 8.6–10.3)
Creat: 1.17 mg/dL (ref 0.70–1.18)
GLUCOSE: 133 mg/dL — AB (ref 65–99)
POTASSIUM: 4.5 mmol/L (ref 3.5–5.3)
SODIUM: 139 mmol/L (ref 135–146)

## 2015-11-12 LAB — CBC
HEMATOCRIT: 40.5 % (ref 38.5–50.0)
HEMOGLOBIN: 14.3 g/dL (ref 13.2–17.1)
MCH: 31.4 pg (ref 27.0–33.0)
MCHC: 35.3 g/dL (ref 32.0–36.0)
MCV: 88.8 fL (ref 80.0–100.0)
MPV: 9.6 fL (ref 7.5–12.5)
Platelets: 236 10*3/uL (ref 140–400)
RBC: 4.56 MIL/uL (ref 4.20–5.80)
RDW: 13.7 % (ref 11.0–15.0)
WBC: 9.1 10*3/uL (ref 3.8–10.8)

## 2015-11-12 LAB — APTT: aPTT: 31 s (ref 22–34)

## 2015-11-14 ENCOUNTER — Encounter: Payer: Self-pay | Admitting: Cardiology

## 2015-11-16 ENCOUNTER — Encounter (HOSPITAL_COMMUNITY): Payer: Self-pay | Admitting: *Deleted

## 2015-11-16 ENCOUNTER — Encounter (HOSPITAL_COMMUNITY)
Admission: RE | Disposition: A | Discharge status: Home / Self Care | Payer: Self-pay | Source: Ambulatory Visit | Attending: Interventional Cardiology

## 2015-11-16 ENCOUNTER — Ambulatory Visit (HOSPITAL_COMMUNITY)
Admission: RE | Admit: 2015-11-16 | Discharge: 2015-11-16 | Disposition: A | Discharge status: Home / Self Care | Payer: Medicare Other | Source: Ambulatory Visit | Attending: Interventional Cardiology | Admitting: Interventional Cardiology

## 2015-11-16 DIAGNOSIS — Z7982 Long term (current) use of aspirin: Secondary | ICD-10-CM | POA: Diagnosis not present

## 2015-11-16 DIAGNOSIS — I2510 Atherosclerotic heart disease of native coronary artery without angina pectoris: Secondary | ICD-10-CM

## 2015-11-16 DIAGNOSIS — I480 Paroxysmal atrial fibrillation: Secondary | ICD-10-CM | POA: Diagnosis present

## 2015-11-16 DIAGNOSIS — I4510 Unspecified right bundle-branch block: Secondary | ICD-10-CM | POA: Insufficient documentation

## 2015-11-16 DIAGNOSIS — Z7901 Long term (current) use of anticoagulants: Secondary | ICD-10-CM | POA: Insufficient documentation

## 2015-11-16 DIAGNOSIS — I2584 Coronary atherosclerosis due to calcified coronary lesion: Secondary | ICD-10-CM | POA: Diagnosis not present

## 2015-11-16 DIAGNOSIS — I5032 Chronic diastolic (congestive) heart failure: Secondary | ICD-10-CM | POA: Diagnosis present

## 2015-11-16 DIAGNOSIS — I110 Hypertensive heart disease with heart failure: Secondary | ICD-10-CM | POA: Diagnosis not present

## 2015-11-16 DIAGNOSIS — Z7984 Long term (current) use of oral hypoglycemic drugs: Secondary | ICD-10-CM | POA: Insufficient documentation

## 2015-11-16 DIAGNOSIS — I429 Cardiomyopathy, unspecified: Secondary | ICD-10-CM | POA: Diagnosis not present

## 2015-11-16 DIAGNOSIS — I5022 Chronic systolic (congestive) heart failure: Secondary | ICD-10-CM | POA: Diagnosis not present

## 2015-11-16 DIAGNOSIS — I10 Essential (primary) hypertension: Secondary | ICD-10-CM

## 2015-11-16 DIAGNOSIS — R9439 Abnormal result of other cardiovascular function study: Secondary | ICD-10-CM

## 2015-11-16 DIAGNOSIS — I4891 Unspecified atrial fibrillation: Secondary | ICD-10-CM

## 2015-11-16 DIAGNOSIS — E119 Type 2 diabetes mellitus without complications: Secondary | ICD-10-CM | POA: Diagnosis not present

## 2015-11-16 HISTORY — PX: CARDIAC CATHETERIZATION: SHX172

## 2015-11-16 LAB — GLUCOSE, CAPILLARY: GLUCOSE-CAPILLARY: 134 mg/dL — AB (ref 65–99)

## 2015-11-16 SURGERY — LEFT HEART CATH AND CORONARY ANGIOGRAPHY
Anesthesia: LOCAL

## 2015-11-16 MED ORDER — MIDAZOLAM HCL 2 MG/2ML IJ SOLN
INTRAMUSCULAR | Status: DC | PRN
  Administered 2015-11-16 (×2): 1 mg via INTRAVENOUS

## 2015-11-16 MED ORDER — MIDAZOLAM HCL 2 MG/2ML IJ SOLN
INTRAMUSCULAR | Status: AC
  Filled 2015-11-16: qty 2

## 2015-11-16 MED ORDER — HEPARIN (PORCINE) IN NACL 2-0.9 UNIT/ML-% IJ SOLN
INTRAMUSCULAR | Status: DC | PRN
  Administered 2015-11-16: 1000 mL

## 2015-11-16 MED ORDER — ACETAMINOPHEN 325 MG PO TABS: 650.0000 mg | ORAL_TABLET | ORAL | Status: DC | PRN

## 2015-11-16 MED ORDER — SODIUM CHLORIDE 0.9 % WEIGHT BASED INFUSION
3.0000 mL/kg/h | INTRAVENOUS | Status: AC
  Administered 2015-11-16: 3 mL/kg/h via INTRAVENOUS

## 2015-11-16 MED ORDER — SODIUM CHLORIDE 0.9 % WEIGHT BASED INFUSION: 1.0000 mL/kg/h | INTRAVENOUS | Status: DC

## 2015-11-16 MED ORDER — HEPARIN SODIUM (PORCINE) 1000 UNIT/ML IJ SOLN
INTRAMUSCULAR | Status: DC | PRN
  Administered 2015-11-16: 5000 [IU] via INTRAVENOUS

## 2015-11-16 MED ORDER — HEPARIN SODIUM (PORCINE) 1000 UNIT/ML IJ SOLN
INTRAMUSCULAR | Status: AC
  Filled 2015-11-16: qty 1

## 2015-11-16 MED ORDER — ASPIRIN 81 MG PO CHEW
CHEWABLE_TABLET | ORAL | Status: AC
  Administered 2015-11-16: 81 mg
  Filled 2015-11-16: qty 1

## 2015-11-16 MED ORDER — ONDANSETRON HCL 4 MG/2ML IJ SOLN: 4.0000 mg | Freq: Four times a day (QID) | INTRAMUSCULAR | Status: DC | PRN

## 2015-11-16 MED ORDER — HEPARIN (PORCINE) IN NACL 2-0.9 UNIT/ML-% IJ SOLN
INTRAMUSCULAR | Status: AC
  Filled 2015-11-16: qty 1000

## 2015-11-16 MED ORDER — ASPIRIN 81 MG PO CHEW: 81.0000 mg | CHEWABLE_TABLET | ORAL | Status: DC

## 2015-11-16 MED ORDER — IOPAMIDOL (ISOVUE-370) INJECTION 76%
INTRAVENOUS | Status: DC | PRN
  Administered 2015-11-16: 80 mL via INTRA_ARTERIAL

## 2015-11-16 MED ORDER — VERAPAMIL HCL 2.5 MG/ML IV SOLN
INTRAVENOUS | Status: DC | PRN
  Administered 2015-11-16: 10 mL via INTRA_ARTERIAL

## 2015-11-16 MED ORDER — FENTANYL CITRATE (PF) 100 MCG/2ML IJ SOLN
INTRAMUSCULAR | Status: DC | PRN
  Administered 2015-11-16: 50 ug via INTRAVENOUS

## 2015-11-16 MED ORDER — ASPIRIN 81 MG PO CHEW: 81.0000 mg | CHEWABLE_TABLET | Freq: Every day | ORAL | Status: DC

## 2015-11-16 MED ORDER — FENTANYL CITRATE (PF) 100 MCG/2ML IJ SOLN
INTRAMUSCULAR | Status: AC
  Filled 2015-11-16: qty 2

## 2015-11-16 MED ORDER — LIDOCAINE HCL (PF) 1 % IJ SOLN
INTRAMUSCULAR | Status: DC | PRN
  Administered 2015-11-16: 2 mL

## 2015-11-16 MED ORDER — VERAPAMIL HCL 2.5 MG/ML IV SOLN
INTRAVENOUS | Status: AC
  Filled 2015-11-16: qty 2

## 2015-11-16 MED ORDER — LIDOCAINE HCL (PF) 1 % IJ SOLN
INTRAMUSCULAR | Status: AC
  Filled 2015-11-16: qty 30

## 2015-11-16 MED ORDER — IOPAMIDOL (ISOVUE-370) INJECTION 76%
INTRAVENOUS | Status: AC
  Filled 2015-11-16: qty 100

## 2015-11-16 SURGICAL SUPPLY — 10 items
CATH IMPULSE 5F ANG/FL3.5 (CATHETERS) ×2 IMPLANT
DEVICE RAD COMP TR BAND LRG (VASCULAR PRODUCTS) ×2 IMPLANT
GLIDESHEATH SLEND A-KIT 6F 22G (SHEATH) ×2 IMPLANT
GLIDESHEATH SLEND SS 6F .021 (SHEATH) IMPLANT
KIT HEART LEFT (KITS) ×2 IMPLANT
PACK CARDIAC CATHETERIZATION (CUSTOM PROCEDURE TRAY) ×2 IMPLANT
TRANSDUCER W/STOPCOCK (MISCELLANEOUS) ×2 IMPLANT
TUBING CIL FLEX 10 FLL-RA (TUBING) ×2 IMPLANT
WIRE HI TORQ VERSACORE-J 145CM (WIRE) ×2 IMPLANT
WIRE SAFE-T 1.5MM-J .035X260CM (WIRE) ×2 IMPLANT

## 2015-11-16 NOTE — Progress Notes (Signed)
Pt to car via wheelchair.  Pt and family members able to verbalize understanding of discharge instructions

## 2015-11-16 NOTE — Discharge Instructions (Signed)
Radial Site Care °Refer to this sheet in the next few weeks. These instructions provide you with information about caring for yourself after your procedure. Your health care provider may also give you more specific instructions. Your treatment has been planned according to current medical practices, but problems sometimes occur. Call your health care provider if you have any problems or questions after your procedure. °WHAT TO EXPECT AFTER THE PROCEDURE °After your procedure, it is typical to have the following: °· Bruising at the radial site that usually fades within 1-2 weeks. °· Blood collecting in the tissue (hematoma) that may be painful to the touch. It should usually decrease in size and tenderness within 1-2 weeks. °HOME CARE INSTRUCTIONS °· Take medicines only as directed by your health care provider. °· You may shower 24-48 hours after the procedure or as directed by your health care provider. Remove the bandage (dressing) and gently wash the site with plain soap and water. Pat the area dry with a clean towel. Do not rub the site, because this may cause bleeding. °· Do not take baths, swim, or use a hot tub until your health care provider approves. °· Check your insertion site every day for redness, swelling, or drainage. °· Do not apply powder or lotion to the site. °· Do not flex or bend the affected arm for 24 hours or as directed by your health care provider. °· Do not push or pull heavy objects with the affected arm for 24 hours or as directed by your health care provider. °· Do not lift over 10 lb (4.5 kg) for 5 days after your procedure or as directed by your health care provider. °· Ask your health care provider when it is okay to: °¨ Return to work or school. °¨ Resume usual physical activities or sports. °¨ Resume sexual activity. °· Do not drive home if you are discharged the same day as the procedure. Have someone else drive you. °· You may drive 24 hours after the procedure unless otherwise  instructed by your health care provider. °· Do not operate machinery or power tools for 24 hours after the procedure. °· If your procedure was done as an outpatient procedure, which means that you went home the same day as your procedure, a responsible adult should be with you for the first 24 hours after you arrive home. °· Keep all follow-up visits as directed by your health care provider. This is important. °SEEK MEDICAL CARE IF: °· You have a fever. °· You have chills. °· You have increased bleeding from the radial site. Hold pressure on the site. °SEEK IMMEDIATE MEDICAL CARE IF: °· You have unusual pain at the radial site. °· You have redness, warmth, or swelling at the radial site. °· You have drainage (other than a small amount of blood on the dressing) from the radial site. °· The radial site is bleeding, and the bleeding does not stop after 30 minutes of holding steady pressure on the site. °· Your arm or hand becomes pale, cool, tingly, or numb. °  °This information is not intended to replace advice given to you by your health care provider. Make sure you discuss any questions you have with your health care provider. °  °Document Released: 05/03/2010 Document Revised: 04/21/2014 Document Reviewed: 10/17/2013 °Elsevier Interactive Patient Education ©2016 [employer] Inc[employer] ° °

## 2015-11-16 NOTE — Interval H&P Note (Signed)
Cath Lab Visit (complete for each Cath Lab visit)  Clinical Evaluation Leading to the Procedure:   ACS: No.  Non-ACS:    Anginal Classification: CCS II  Anti-ischemic medical therapy: Maximal Therapy (2 or more classes of medications)  Non-Invasive Test Results: Intermediate-risk stress test findings: cardiac mortality 1-3%/year  Prior CABG: No previous CABG      History and Physical Interval Note:  11/16/2015 8:53 AM  Jason [name]  has presented today for surgery, with the diagnosis of chest pain  The various methods of treatment have been discussed with the patient and family. After consideration of risks, benefits and other options for treatment, the patient has consented to  Procedure(s): Left Heart Cath and Coronary Angiography (N/A) as a surgical intervention .  The patient's history has been reviewed, patient examined, no change in status, stable for surgery.  I have reviewed the patient's chart and labs.  Questions were answered to the patient's satisfaction.     Lyn Records III

## 2015-11-16 NOTE — H&P (View-Only) (Signed)
Cardiology Office Note    Date:  10/29/2015   ID:  Jason [name], DOB Jan 24, 1941, MRN [id number]  PCP:  Jason Furry, PA-C  Cardiologist: Dr. Katrinka [name]  Chief complaint follow-up of atrial fibrillation  History of Present Illness:  Jason [name] is a 75 y.o. male with history of paroxysmal atrial fibrillation status post DC cardioversion 08/28/15. Patient also has history of chronic systolic heart failure EF 20-25% which is felt to be related to his atrial fibrillation tachycardia induced, hypertension and chronic anticoagulation with abixaban. There is also concern for CAD based on the presence of coronary artery calcification seen on recent chest CT. Lasts seen by Jason Medici, PA 09/20/15 and had sinus bradycardia at 47 bpm he was on amiodarone 200 mg daily along with carvedilol 12.5 mg twice a day. Carvedilol was reduced to 6.25 mg twice a day.  Nuclear stress test was performed on 10/01/15 which is a low risk study EF 55% there was a small defect of moderate severity in the mid anterior septal and apical septal location. Mostly infarct with very mild peri-infarct ischemia. There was a small defect of moderate severity in the apical lateral location consistent with infarct. Dr. Katrinka [name] reviewed his stress test in chart in detail recommends the patient have a cardiac catheterization.  Patient [place] and goes up and down multiple stairs without difficulty. Since his cardioversion he denies any chest pain, palpitations, dyspnea, dyspnea on exertion, dizziness or presyncope. Prior to cardioversion he was having a lot of dyspnea on exertion and orthopnea and edema.    Past Medical History  Diagnosis Date  . Diabetes mellitus without complication (HCC)   . Hypertension   . Atrial fibrillation [facility]     Past Surgical History  Procedure Laterality Date  . Hernia repair    . Kidney stone surgery    . Cardioversion N/A 08/28/2015    Procedure: CARDIOVERSION;  Surgeon: Jason Bunting, MD;  Location: [facility] ENDOSCOPY;  Service: Cardiovascular;  Laterality: N/A;    Current Medications: Outpatient Prescriptions Prior to Visit  Medication Sig Dispense Refill  . apixaban (ELIQUIS) 5 MG TABS tablet Take 1 tablet (5 mg total) by mouth 2 (two) times daily. 60 tablet 1  . atorvastatin (LIPITOR) 40 MG tablet Take 1 tablet (40 mg total) by mouth daily at 6 PM. 30 tablet 11  . carvedilol (COREG) 6.25 MG tablet Take 1 tablet (6.25 mg total) by mouth 2 (two) times daily. 60 tablet 11  . furosemide (LASIX) 40 MG tablet Take 1 tablet (40 mg total) by mouth daily. 30 tablet 11  . metFORMIN (GLUCOPHAGE) 500 MG tablet Take 500 mg by mouth daily with breakfast.    . Multiple Vitamins-Minerals (MULTIVITAMIN WITH MINERALS) tablet Take 1 tablet by mouth daily.    Marland Kitchen amiodarone (PACERONE) 200 MG tablet Take 200 mg by mouth daily.    Marland Kitchen lisinopril (PRINIVIL,ZESTRIL) 5 MG tablet Take 1 tablet (5 mg total) by mouth daily. 30 tablet 1  . aspirin 81 MG tablet Take 1 tablet (81 mg total) by mouth daily. (Patient not taking: Reported on 10/29/2015) 30 tablet 0   No facility-administered medications prior to visit.     Allergies:   Review of patient's allergies indicates no known allergies.   Social History   Social History  . Marital Status: Married    Spouse Name: N/A  . Number of Children: N/A  . Years of Education: N/A   Social History Main Topics  .  Smoking status: Never Smoker   . Smokeless tobacco: None  . Alcohol Use: No  . Drug Use: No  . Sexual Activity: Not Asked   Other Topics Concern  . None   Social History Narrative     Family History:  The patient's family history is negative for Heart attack and Hypertension.   ROS:   Please see the history of present illness.    Review of Systems  Constitution: Negative.  HENT: Negative.   Cardiovascular: Negative.   Respiratory: Negative.   Endocrine: Negative.   Hematologic/Lymphatic: Negative.   Musculoskeletal:  Negative.   Gastrointestinal: Negative.   Genitourinary: Negative.   Neurological: Negative.    All other systems reviewed and are negative.   PHYSICAL EXAM:   VS:  BP 122/68 mmHg  Pulse 80  Ht  (1.803 m)  Wt 209 lb 9.6 oz (95.074 kg)  BMI 29.25 kg/m2  Physical Exam  GEN: Well nourished, well developed, in no acute distress HEENT: normal Neck: no JVD, carotid bruits, or masses Cardiac:RRR; no murmurs, rubs, or gallops  Respiratory:  clear to auscultation bilaterally, normal work of breathing GI: soft, nontender, nondistended, + BS Ext: without cyanosis, clubbing, or edema, Good distal pulses bilaterally MS: no deformity or atrophy Skin: warm and dry, no rash Neuro:  Alert and Oriented x 3, Strength and sensation are intact Psych: euthymic mood, full affect  Wt Readings from Last 3 Encounters:  10/29/15 209 lb 9.6 oz (95.074 kg)  09/20/15 213 lb 1.9 oz (96.671 kg)  08/10/15 214 lb 6.4 oz (97.251 kg)      Studies/Labs Reviewed:   EKG:  EKG is ordered today.  The ekg ordered today demonstrates Normal sinus rhythm at 60 bpm incomplete right bundle branch block  Recent Labs: 07/29/2015: ALT 20; B Natriuretic Peptide 360.6*; TSH 1.434 08/27/2015: BUN 23; Creat 1.19*; Hemoglobin 16.8; Platelets 177; Potassium 4.1; Sodium 140   Lipid Panel    Component Value Date/Time   CHOL 130 07/29/2015 1857   TRIG 61 07/29/2015 1857   HDL 30* 07/29/2015 1857   CHOLHDL 4.3 07/29/2015 1857   VLDL 12 07/29/2015 1857   LDLCALC 88 07/29/2015 1857    Additional studies/ records that were reviewed today include:  2-D echo 07/2015 Study Conclusions   - Left ventricle: The cavity size was normal. There was moderate   concentric hypertrophy. Systolic function was severely reduced.   The estimated ejection fraction was in the range of 20% to 25%.   Diffuse hypokinesis. - Aortic valve: There was trivial regurgitation. - Mitral valve: Calcified annulus. - Left atrium: The atrium was  moderately dilated. - Right ventricle: The cavity size was mildly dilated. Wall   thickness was normal. - Right atrium: The atrium was moderately dilated. - Tricuspid valve: There was trivial regurgitation. - Pulmonary arteries: Systolic pressure was mildly increased. PA   peak pressure: 39 mm Hg (S).    Nuclear stress test 09/2015 Study Highlights      Nuclear stress EF: 57%.  There was no ST segment deviation noted during stress.  No T wave inversion was noted during stress.  Defect 1: There is a small defect of moderate severity present in the mid anteroseptal and apical septal location. Mostly infarct with very mild peri-infarct ischemia.  Defect 2: There is a small defect of moderate severity present in the apical lateral location. This is consistent with infarct.  This is a low risk study.  The left ventricular ejection fraction is normal (55-65%).  ASSESSMENT:    1. Abnormal nuclear stress test   2. Atrial fibrillation, unspecified type (HCC)   3. Coronary artery calcification seen on CT scan   4. Essential hypertension   5. Cardiomyopathy (HCC)      PLAN:  In order of problems listed above: Abnormal nuclear stress test as listed above. Reviewed in detail by Dr. Katrinka [name] who recommends cardiac catheterization. He thinks his atrial fibrillation may be secondary to CAD and ischemia. In light of his cardiomyopathy in April of this year EF 20-25% cath recommended. Risks and benefits discussed with patient and daughter in detail who are agreeable to proceed. He will have to hold his Eliquis 48 hours prior to cath and metformin 24 hours before cath. I have reviewed the risks, indications, and alternatives to angioplasty and stenting with the patient. Risks include but are not limited to bleeding, infection, vascular injury, stroke, myocardial infection, arrhythmia, kidney injury, radiation-related injury in the case of prolonged fluoroscopy use, emergency cardiac surgery,  and death. The patient understands the risks of serious complication is low (<1%) and he agrees to proceed.   Paroxysmal atrial fibrillation maintaining normal sinus rhythm heart rate is 60 today so we will continue carvedilol 6.25 mg twice a day  Coronary calcification on CT for cardiac catheterization with abnormal stress test  Essential hypertension controlled  Cardiomyopathy ejection fraction 20-25% on echo in April 2017 EF now 55-65% on nuclear stress test. Patient is feeling better and has no symptoms of heart failure.      Medication Adjustments/Labs and Tests Ordered: Current medicines are reviewed at length with the patient today.  Concerns regarding medicines are outlined above.  Medication changes, Labs and Tests ordered today are listed in the Patient Instructions below. Patient Instructions  Medication Instructions:     If you need a refill on your cardiac medications before your next appointment, please call your pharmacy.  Labwork:   Testing/Procedures:   Follow-Up:   Any Other Special Instructions Will Be Listed Below (If Applicable).                                                                                                                                                       Jason Clan, PA-C  10/29/2015 10:23 AM    [facility] [address], [place], [place]  [address] Phone: [phone]; Fax: [phone]

## 2015-11-30 ENCOUNTER — Ambulatory Visit (INDEPENDENT_AMBULATORY_CARE_PROVIDER_SITE_OTHER): Payer: Medicare Other | Admitting: Cardiology

## 2015-11-30 ENCOUNTER — Encounter: Payer: Self-pay | Admitting: Cardiology

## 2015-11-30 VITALS — BP 132/70 | HR 56 | Ht 71.0 in | Wt 210.1 lb

## 2015-11-30 DIAGNOSIS — I480 Paroxysmal atrial fibrillation: Secondary | ICD-10-CM

## 2015-11-30 DIAGNOSIS — I2510 Atherosclerotic heart disease of native coronary artery without angina pectoris: Secondary | ICD-10-CM

## 2015-11-30 NOTE — Progress Notes (Signed)
11/30/2015 Jason [name]   11/27/1940  [id number]  Primary Physician Tanna FurryHOUT, BRITTANY, PA-C Primary Cardiologist: Dr. Katrinka BlazingSmith   Reason for Visit/CC: F/u for CAD/ PAF  HPI:  The patient is a 75 year old male, followed by Dr. Katrinka BlazingSmith, who presents to clinic today for post procedural follow-up. He recently underwent a [facility]. His cardiac history significant for paroxysmal atrial fibrillation status post successful cardioversion in May 2017. He has retained sinus rhythm on amiodarone. He is on eloquent for anticoagulation. He also has chronic systolic heart failure which was felt to be tachycardia induced from his atrial fibrillation. A cardiogram in April 2017 showed an EF of 20-25%. So previously had a chest CT which showed coronary artery calcifications. Subsequently he was her to undergo a nuclear stress test. Was performed 10/01/2015. He was noted to have 2 defects concerning for ischemia:   Defect 1: There is a small defect of moderate severity present in the mid anteroseptal and apical septal location. Mostly infarct with very mild peri-infarct ischemia.  Defect 2: There is a small defect of moderate severity present in the apical lateral location. This is consistent with infarct.                                                                                                                                                                                                                   Subsequently, he was referred for definitive left heart catheterization. This was performed which demonstrated the following:   Noncritical coronary atherosclerosis with 60-70% calcified eccentric mid LAD stenosis. 75% stenosis in the midportion of a small first obtuse marginal, and 30% mid RCA.  Normal left ventricular function. Elevated left ventricular end-diastolic pressure compatible with diastolic dysfunction. EF 55-60%.  Dr. Katrinka BlazingSmith felt that his apical ischemia,  seen on stress test was secondary to borderline significant mid LAD. However, in the absence of symptoms Dr Katrinka BlazingSmith felt that intensification of medical therapy was the most prudent approach. No PCI was performed. Per Dr. Katrinka BlazingSmith, with improved LV function, further intensification of beta blocker therapy would be helpful for ischemic heart disease noted above. ACE inhibitor therapy is not mandatory. He also recommended antiplatelet therapy.   In regards to his PAF, Goal will be eventual discontinuation of amiodarone if no recurrences of atrial fib. Long-term anticoagulation therapy with Eliquis may probably be necessary but will depend upon course and whether there is recurrent atrial fibrillation. Aggressive risk factor modification was also recommended.   The patient presents  back to clinic today for f/u. He is her with his daughter. He reports that he has done well. No chest pain. No dyspnea. No symptoms of breakthrough atrial fibrillation including no palpitations. His EKG today shows sinus bradycardia with a heart rate of 56 bpm. Blood pressure is well controlled at 132/70. He reports full medication compliance with all of his medications. No abnormal bleeding with Eliquis.    Current Outpatient Prescriptions  Medication Sig Dispense Refill  . acetaminophen (TYLENOL) 325 MG tablet Take 650 mg by mouth every 6 (six) hours as needed for mild pain.    Marland Kitchen. amiodarone (PACERONE) 200 MG tablet Take 1 tablet (200 mg total) by mouth daily. 30 tablet 5  . apixaban (ELIQUIS) 5 MG TABS tablet Take 1 tablet (5 mg total) by mouth 2 (two) times daily. 180 tablet 3  . atorvastatin (LIPITOR) 40 MG tablet Take 1 tablet (40 mg total) by mouth daily at 6 PM. 30 tablet 11  . carvedilol (COREG) 6.25 MG tablet Take 1 tablet (6.25 mg total) by mouth 2 (two) times daily. 60 tablet 11  . furosemide (LASIX) 40 MG tablet Take 1 tablet (40 mg total) by mouth daily. 30 tablet 11  . lisinopril (PRINIVIL,ZESTRIL) 5 MG tablet Take 1  tablet (5 mg total) by mouth daily. 30 tablet 5  . metFORMIN (GLUCOPHAGE) 500 MG tablet Take 500 mg by mouth daily with breakfast.    . Multiple Vitamins-Minerals (MULTIVITAMIN WITH MINERALS) tablet Take 1 tablet by mouth daily.     No current facility-administered medications for this visit.     No Known Allergies  Social History   Social History  . Marital status: Married    Spouse name: N/A  . Number of children: N/A  . Years of education: N/A   Occupational History  . Not on file.   Social History Main Topics  . Smoking status: Never Smoker  . Smokeless tobacco: Never Used  . Alcohol use No  . Drug use: No  . Sexual activity: No   Other Topics Concern  . Not on file   Social History Narrative  . No narrative on file     Review of Systems: General: negative for chills, fever, night sweats or weight changes.  Cardiovascular: negative for chest pain, dyspnea on exertion, edema, orthopnea, palpitations, paroxysmal nocturnal dyspnea or shortness of breath Dermatological: negative for rash Respiratory: negative for cough or wheezing Urologic: negative for hematuria Abdominal: negative for nausea, vomiting, diarrhea, bright red blood per rectum, melena, or hematemesis Neurologic: negative for visual changes, syncope, or dizziness All other systems reviewed and are otherwise negative except as noted above.    Blood pressure 132/70, pulse (!) 56, height 5\' 11"  (1.803 m), weight 210 lb 1.9 oz (95.3 kg).  General appearance: alert, cooperative and no distress Neck: no carotid bruit and no JVD Lungs: clear to auscultation bilaterally Heart: regular rate and rhythm, S1, S2 normal, no murmur, click, rub or gallop Extremities: no LEE Pulses: 2+ and symmetric Skin: warm and dry Neurologic: Grossly normal  EKG sinus bradycardia. 75 bpm   ASSESSMENT AND PLAN:   1. CAD: recent LHC revealing Noncritical coronary atherosclerosis with 60-70% calcified eccentric mid LAD  stenosis. 75% stenosis in the midportion of a small first obtuse marginal, and 30% mid RCA. Medical therapy elected given lack of symptoms. He denies CP and dyspnea. Continue BB, statin, ACE-I.   2. H/o LV Systolic Dysfunction: EF previously 20-25% by echo 07/2015. This was felt to be  tachy-induced by rapid atrial fibrillation. Since restoration of NSR by cardioversion, he has had improvement with normalization in EF back to 55-60%.  3. PAF: s/p DDCV 08/2015. EKG today shows. Currently on Amiodarone and Eliquis. Per Dr. Katrinka Blazing, Goal will be eventual discontinuation of amiodarone if no recurrences of atrial fib. Long-term anticoagulation therapy with Eliquis may probably be necessary but will depend upon course and whether there is recurrent atrial fibrillation. EKG shows SR. Will continue with amiodarone and Eliquis until he see's Dr. Katrinka Blazing again in f/u next month. If still in NSR, may consider discontinuing amiodarone at that time.   4. HLD: on Lipitor.  5. HTN: BP is controlled.   6. DM: on Metformin. Followed by PCP.    PLAN  Keep f/u with Dr. Katrinka Blazing 01/11/16  Robbie Lis PA-C 11/30/2015 2:31 PM

## 2015-11-30 NOTE — Patient Instructions (Signed)
Medication Instructions:   Your physician recommends that you continue on your current medications as directed. Please refer to the Current Medication list given to you today.  If you need a refill on your cardiac medications before your next appointment, please call your pharmacy.  Labwork: NONE ORDER TODAY    Testing/Procedures: NONE ORDER TODAY    Follow-Up: KEEP APPOINTMENT WITH DR Katrinka BlazingSMITH AS SCHEDULED    Any Other Special Instructions Will Be Listed Below (If Applicable).

## 2015-12-24 ENCOUNTER — Other Ambulatory Visit: Payer: Self-pay

## 2015-12-24 MED ORDER — FUROSEMIDE 40 MG PO TABS: 40.0000 mg | ORAL_TABLET | Freq: Every day | ORAL | 11 refills | Status: DC

## 2016-01-11 ENCOUNTER — Encounter: Payer: Self-pay | Admitting: Interventional Cardiology

## 2016-01-11 ENCOUNTER — Ambulatory Visit (INDEPENDENT_AMBULATORY_CARE_PROVIDER_SITE_OTHER): Payer: Medicare Other | Admitting: Interventional Cardiology

## 2016-01-11 VITALS — BP 130/82 | HR 58 | Ht 71.0 in | Wt 211.4 lb

## 2016-01-11 DIAGNOSIS — I10 Essential (primary) hypertension: Secondary | ICD-10-CM | POA: Diagnosis not present

## 2016-01-11 DIAGNOSIS — I2510 Atherosclerotic heart disease of native coronary artery without angina pectoris: Secondary | ICD-10-CM

## 2016-01-11 DIAGNOSIS — I429 Cardiomyopathy, unspecified: Secondary | ICD-10-CM | POA: Diagnosis not present

## 2016-01-11 DIAGNOSIS — I5032 Chronic diastolic (congestive) heart failure: Secondary | ICD-10-CM

## 2016-01-11 DIAGNOSIS — Z79899 Other long term (current) drug therapy: Secondary | ICD-10-CM

## 2016-01-11 DIAGNOSIS — I480 Paroxysmal atrial fibrillation: Secondary | ICD-10-CM

## 2016-01-11 LAB — HEPATIC FUNCTION PANEL
ALBUMIN: 4.7 g/dL (ref 3.6–5.1)
ALK PHOS: 68 U/L (ref 40–115)
ALT: 12 U/L (ref 9–46)
AST: 21 U/L (ref 10–35)
BILIRUBIN TOTAL: 0.7 mg/dL (ref 0.2–1.2)
Bilirubin, Direct: 0.2 mg/dL (ref <=0.2)
Indirect Bilirubin: 0.5 mg/dL (ref 0.2–1.2)
Total Protein: 7.3 g/dL (ref 6.1–8.1)

## 2016-01-11 LAB — TSH: TSH: 2.83 mIU/L (ref 0.40–4.50)

## 2016-01-11 NOTE — Patient Instructions (Addendum)
Medication Instructions:  Your physician recommends that you continue on your current medications as directed. Please refer to the Current Medication list given to you today.   Labwork: Tsh and Hepatic today  Your physician recommends that you return for lab work in: April 2018 (Tsh,Hepatic)   Testing/Procedures: None ordered  Follow-Up: Your physician wants you to follow-up in: April 2018 with Dr.Smith You will receive a reminder letter in the mail two months in advance. If you don't receive a letter, please call our office to schedule the follow-up appointment.    Any Other Special Instructions Will Be Listed Below (If Applicable).     If you need a refill on your cardiac medications before your next appointment, please call your pharmacy.

## 2016-01-11 NOTE — Progress Notes (Signed)
Cardiology Office Note    Date:  01/11/2016   ID:  Jason [name], DOB [birth year], MRN [id number]  PCP:  Tanna FurryHOUT, BRITTANY, PA-C  Cardiologist: Lesleigh NoeHenry W  III, MD   Chief Complaint  Patient presents with  . Atrial Fibrillation    History of Present Illness:  Jason [name] is a 75 y.o. male who presents for Follow-up of acute on chronic systolic heart failure (LVEF by echo 20-25%), atrial fibrillation of unknown duration, hypertension, and silent coronary disease to noted by calcium on CT scan. Recent cardiac catheterization demonstrated moderate coronary disease and return of LVEF to 55%.   He is now status post recovery of tachycardia induced cardiomyopathy. EF is as noted above. He is maintaining normal sinus rhythm. No medication side effects. No bleeding on anticoagulation therapy. He has an elevated CHADS VASC score greater than 2.  Past Medical History:  Diagnosis Date  . Atrial fibrillation (HCC)   . Diabetes mellitus without complication (HCC)   . Hypertension     Past Surgical History:  Procedure Laterality Date  . CARDIAC CATHETERIZATION N/A 11/16/2015   Procedure: Left Heart Cath and Coronary Angiography;  Surgeon: Lyn RecordsHenry W , MD;  [facility];  Service: Cardiovascular;  Laterality: N/A;  . CARDIOVERSION N/A 08/28/2015   Procedure: CARDIOVERSION;  Surgeon: Lewayne BuntingBrian S Crenshaw, MD;  [facility];  Service: Cardiovascular;  Laterality: N/A;  . HERNIA REPAIR    . KIDNEY STONE SURGERY      Current Medications: Outpatient Medications Prior to Visit  Medication Sig Dispense Refill  . acetaminophen (TYLENOL) 325 MG tablet Take 650 mg by mouth every 6 (six) hours as needed for mild pain.    Marland Kitchen. amiodarone (PACERONE) 200 MG tablet Take 1 tablet (200 mg total) by mouth daily. 30 tablet 5  . apixaban (ELIQUIS) 5 MG TABS tablet Take 1 tablet (5 mg total) by mouth 2 (two) times daily. 180 tablet 3  . atorvastatin (LIPITOR) 40 MG tablet Take 1 tablet  (40 mg total) by mouth daily at 6 PM. 30 tablet 11  . carvedilol (COREG) 6.25 MG tablet Take 1 tablet (6.25 mg total) by mouth 2 (two) times daily. 60 tablet 11  . furosemide (LASIX) 40 MG tablet Take 1 tablet (40 mg total) by mouth daily. 30 tablet 11  . lisinopril (PRINIVIL,ZESTRIL) 5 MG tablet Take 1 tablet (5 mg total) by mouth daily. 30 tablet 5  . metFORMIN (GLUCOPHAGE) 500 MG tablet Take 500 mg by mouth daily with breakfast.    . Multiple Vitamins-Minerals (MULTIVITAMIN WITH MINERALS) tablet Take 1 tablet by mouth daily.     No facility-administered medications prior to visit.      Allergies:   Review of patient's allergies indicates no known allergies.   Social History   Social History  . Marital status: Married    Spouse name: N/A  . Number of children: N/A  . Years of education: N/A   Social History Main Topics  . Smoking status: Never Smoker  . Smokeless tobacco: Never Used  . Alcohol use No  . Drug use: No  . Sexual activity: No   Other Topics Concern  . None   Social History Narrative  . None     Family History:  The patient's family history is not on file.   ROS:   Please see the history of present illness.    No problems  All other systems reviewed and are negative.   PHYSICAL EXAM:  VS:  BP 130/82   Pulse (!) 58   Ht 5\' 11"  (1.803 m)   Wt 211 lb 6.4 oz (95.9 kg)   BMI 29.48 kg/m    GEN: Well nourished, well developed, in no acute distress  HEENT: normal  Neck: no JVD, carotid bruits, or masses Cardiac: RRR; no murmurs, rubs, or gallops,no edema  Respiratory:  clear to auscultation bilaterally, normal work of breathing GI: soft, nontender, nondistended, + BS MS: no deformity or atrophy  Skin: warm and dry, no rash Neuro:  Alert and Oriented x 3, Strength and sensation are intact Psych: euthymic mood, full affect  Wt Readings from Last 3 Encounters:  01/11/16 211 lb 6.4 oz (95.9 kg)  11/30/15 210 lb 1.9 oz (95.3 kg)  11/16/15 212 lb (96.2  kg)      Studies/Labs Reviewed:   EKG:  EKG  Not repeated  Recent Labs: 07/29/2015: ALT 20; B Natriuretic Peptide 360.6; TSH 1.434 11/12/2015: BUN 28; Creat 1.17; Hemoglobin 14.3; Platelets 236; Potassium 4.5; Sodium 139   Lipid Panel    Component Value Date/Time   CHOL 130 07/29/2015 1857   TRIG 61 07/29/2015 1857   HDL 30 (L) 07/29/2015 1857   CHOLHDL 4.3 07/29/2015 1857   VLDL 12 07/29/2015 1857   LDLCALC 88 07/29/2015 1857    Additional studies/ records that were reviewed today include:  Cardiac cath August 2017:  Noncritical coronary atherosclerosis with 60-70% calcified eccentric mid LAD stenosis. 75% stenosis in the midportion of a small first obtuse marginal, and 30% mid RCA.  Normal left ventricular function. Elevated left ventricular end-diastolic pressure compatible with diastolic dysfunction. EF 55-60%.  Apical ischemia felt secondary to borderline significant mid LAD. In absence of symptoms intensification of medical therapy seems most prudent approach.   ASSESSMENT:    1. Acute on chronic systolic heart failure (HCC)   2. Cardiomyopathy (HCC)   3. Coronary artery calcification seen on CT scan   4. Essential hypertension   5. Paroxysmal atrial fibrillation (HCC)   6. On amiodarone therapy      PLAN:  In order of problems listed above:  1. Resolved to diastolic dysfunction. EF documented to be greater than 50%. 2. Rate/tachycardia dependent cardiomyopathy has resolved to normal after conversion of atrial fibrillation to normal sinus rhythm. 3. Asymptomatic moderate coronary disease. 4. Excellent control. 2 g sodium diet. 5. Maintaining normal sinus rhythm. He is on amiodarone therapy. He is on Eliquis. 6. TSH and hepatic panel today and on return in 5-6 months. At that time we'll consider weaning or discontinuing amiodarone if he maintains sinus rhythm. TSH and padding panel will be done at that time as well.    Medication Adjustments/Labs and Tests  Ordered: Current medicines are reviewed at length with the patient today.  Concerns regarding medicines are outlined above.  Medication changes, Labs and Tests ordered today are listed in the Patient Instructions below. There are no Patient Instructions on file for this visit.   Signed, Lesleigh Noe, MD  01/11/2016 11:01 AM    [facility] [address] [place], [place], [place]  [address] Phone: [phone]; Fax: [phone]

## 2016-01-25 DIAGNOSIS — H16101 Unspecified superficial keratitis, right eye: Secondary | ICD-10-CM | POA: Diagnosis not present

## 2016-01-25 DIAGNOSIS — S0501XA Injury of conjunctiva and corneal abrasion without foreign body, right eye, initial encounter: Secondary | ICD-10-CM | POA: Diagnosis not present

## 2016-01-25 DIAGNOSIS — H1033 Unspecified acute conjunctivitis, bilateral: Secondary | ICD-10-CM | POA: Diagnosis not present

## 2016-01-30 ENCOUNTER — Telehealth: Payer: Self-pay | Admitting: *Deleted

## 2016-01-30 NOTE — Telephone Encounter (Signed)
Called patient to let him know that his eliquis had arrived from the company and that I will place it at the front desk.

## 2016-02-06 DIAGNOSIS — K5730 Diverticulosis of large intestine without perforation or abscess without bleeding: Secondary | ICD-10-CM | POA: Diagnosis not present

## 2016-02-06 DIAGNOSIS — Z01818 Encounter for other preprocedural examination: Secondary | ICD-10-CM | POA: Diagnosis not present

## 2016-02-06 DIAGNOSIS — Z86010 Personal history of colonic polyps: Secondary | ICD-10-CM | POA: Diagnosis not present

## 2016-02-07 ENCOUNTER — Telehealth: Payer: Self-pay | Admitting: Interventional Cardiology

## 2016-02-07 NOTE — Telephone Encounter (Signed)
He should hold the eloquent's at least 72 hours. If his gastroenterologist wants to hold it longer, that will be okay.

## 2016-02-07 NOTE — Telephone Encounter (Signed)
Will fax to Dr. Camila LiMisenheimer's office.

## 2016-02-07 NOTE — Telephone Encounter (Signed)
Request for surgical clearance:  1. What type of surgery is being performed? Colonoscopy   2. When is this surgery scheduled? 02/19/16   3. Are there any medications that need to be held prior to surgery and how long?  Eliquis- needs to stop 02/16/16  4. Name of physician performing surgery? Dr. Jennye BoroughsMisenheimer - [facility]  5. What is your office phone and fax number? Ph [phone]  Fax [phone]

## 2016-02-19 DIAGNOSIS — Z1211 Encounter for screening for malignant neoplasm of colon: Secondary | ICD-10-CM | POA: Diagnosis not present

## 2016-02-19 DIAGNOSIS — Z7984 Long term (current) use of oral hypoglycemic drugs: Secondary | ICD-10-CM | POA: Diagnosis not present

## 2016-02-19 DIAGNOSIS — Z79899 Other long term (current) drug therapy: Secondary | ICD-10-CM | POA: Diagnosis not present

## 2016-02-19 DIAGNOSIS — I10 Essential (primary) hypertension: Secondary | ICD-10-CM | POA: Diagnosis not present

## 2016-02-19 DIAGNOSIS — E119 Type 2 diabetes mellitus without complications: Secondary | ICD-10-CM | POA: Diagnosis not present

## 2016-02-19 DIAGNOSIS — Z86010 Personal history of colonic polyps: Secondary | ICD-10-CM | POA: Diagnosis not present

## 2016-04-08 ENCOUNTER — Other Ambulatory Visit: Payer: Self-pay | Admitting: *Deleted

## 2016-04-08 ENCOUNTER — Other Ambulatory Visit: Payer: Self-pay | Admitting: Physician Assistant

## 2016-04-08 MED ORDER — AMIODARONE HCL 200 MG PO TABS: 200.0000 mg | ORAL_TABLET | Freq: Every day | ORAL | 2 refills | Status: DC

## 2016-05-17 ENCOUNTER — Other Ambulatory Visit: Payer: Self-pay | Admitting: Physician Assistant

## 2016-05-27 ENCOUNTER — Telehealth: Payer: Self-pay | Admitting: *Deleted

## 2016-05-27 NOTE — Telephone Encounter (Signed)
Spoke with patient and made him aware that his eliquis had arrived from the company and that I would place it at the front desk.

## 2016-07-10 ENCOUNTER — Encounter: Payer: Self-pay | Admitting: Interventional Cardiology

## 2016-07-16 DIAGNOSIS — E785 Hyperlipidemia, unspecified: Secondary | ICD-10-CM | POA: Diagnosis not present

## 2016-07-16 DIAGNOSIS — Z6829 Body mass index (BMI) 29.0-29.9, adult: Secondary | ICD-10-CM | POA: Diagnosis not present

## 2016-07-16 DIAGNOSIS — Z1389 Encounter for screening for other disorder: Secondary | ICD-10-CM | POA: Diagnosis not present

## 2016-07-16 DIAGNOSIS — Z9181 History of falling: Secondary | ICD-10-CM | POA: Diagnosis not present

## 2016-07-16 DIAGNOSIS — E119 Type 2 diabetes mellitus without complications: Secondary | ICD-10-CM | POA: Diagnosis not present

## 2016-07-16 DIAGNOSIS — E663 Overweight: Secondary | ICD-10-CM | POA: Diagnosis not present

## 2016-07-23 ENCOUNTER — Ambulatory Visit (INDEPENDENT_AMBULATORY_CARE_PROVIDER_SITE_OTHER): Payer: Medicare Other | Admitting: Interventional Cardiology

## 2016-07-23 ENCOUNTER — Encounter: Payer: Self-pay | Admitting: Interventional Cardiology

## 2016-07-23 VITALS — BP 146/80 | HR 50 | Ht 71.0 in | Wt 211.8 lb

## 2016-07-23 DIAGNOSIS — Z79899 Other long term (current) drug therapy: Secondary | ICD-10-CM | POA: Insufficient documentation

## 2016-07-23 DIAGNOSIS — I480 Paroxysmal atrial fibrillation: Secondary | ICD-10-CM | POA: Diagnosis not present

## 2016-07-23 DIAGNOSIS — Z7901 Long term (current) use of anticoagulants: Secondary | ICD-10-CM | POA: Insufficient documentation

## 2016-07-23 DIAGNOSIS — I5032 Chronic diastolic (congestive) heart failure: Secondary | ICD-10-CM | POA: Diagnosis not present

## 2016-07-23 DIAGNOSIS — D729 Disorder of white blood cells, unspecified: Secondary | ICD-10-CM | POA: Diagnosis not present

## 2016-07-23 MED ORDER — AMIODARONE HCL 200 MG PO TABS: 100.0000 mg | ORAL_TABLET | Freq: Every day | ORAL | 2 refills | Status: DC

## 2016-07-23 NOTE — Patient Instructions (Signed)
Medication Instructions:  1) DECREASE Amiodarone to  once daily  Labwork: TSH and CMET today  Testing/Procedures: A chest x-ray takes a picture of the organs and structures inside the chest, including the heart, lungs, and blood vessels. This test can show several things, including, whether the heart is enlarges; whether fluid is building up in the lungs; and whether pacemaker / defibrillator leads are still in place.  Have this done prior to your 6 month follow up.    Follow-Up: Your physician wants you to follow-up in: 6 months with Dr. Katrinka Blazing.  You will receive a reminder letter in the mail two months in advance. If you don't receive a letter, please call our office to schedule the follow-up appointment.   Any Other Special Instructions Will Be Listed Below (If Applicable).     If you need a refill on your cardiac medications before your next appointment, please call your pharmacy.

## 2016-07-23 NOTE — Progress Notes (Signed)
Cardiology Office Note    Date:  07/23/2016   ID:  Jason [name], DOB 01-01-1941, MRN [id number]  PCP:  Tanna Furry, PA-C  Cardiologist: Lesleigh Noe, MD   Chief Complaint  Patient presents with  . Congestive Heart Failure    History of Present Illness:  Jason [name] is a 76 y.o. male who presents for follow-up of acute on chronic systolic heart failure (LVEF by echo 20-25%), atrial fibrillation of unknown duration, hypertension, and silent coronary disease to noted by calcium on CT scan. Recent cardiac catheterization demonstrated moderate coronary disease and return of LVEF to 55%.  Doing well. Back to full activity. LV recovered to normal. Maintaining sinus rhythm based on clinical exam today. He could not tell he was in atrial fibrillation last year when he presented.  Past Medical History:  Diagnosis Date  . Atrial fibrillation (HCC)   . Diabetes mellitus without complication (HCC)   . Hypertension     Past Surgical History:  Procedure Laterality Date  . CARDIAC CATHETERIZATION N/A 11/16/2015   Procedure: Left Heart Cath and Coronary Angiography;  Surgeon: Lyn Records, MD;  Location: [facility] INVASIVE CV LAB;  Service: Cardiovascular;  Laterality: N/A;  . CARDIOVERSION N/A 08/28/2015   Procedure: CARDIOVERSION;  Surgeon: Lewayne Bunting, MD;  Location: [facility] ENDOSCOPY;  Service: Cardiovascular;  Laterality: N/A;  . HERNIA REPAIR    . KIDNEY STONE SURGERY      Current Medications: Outpatient Medications Prior to Visit  Medication Sig Dispense Refill  . acetaminophen (TYLENOL) 325 MG tablet Take 650 mg by mouth every 6 (six) hours as needed for mild pain.    Marland Kitchen apixaban (ELIQUIS) 5 MG TABS tablet Take 1 tablet (5 mg total) by mouth 2 (two) times daily. 180 tablet 3  . atorvastatin (LIPITOR) 40 MG tablet Take 1 tablet (40 mg total) by mouth daily at 6 PM. 30 tablet 11  . carvedilol (COREG) 6.25 MG tablet Take 1 tablet (6.25 mg total) by mouth 2 (two) times daily. 60  tablet 11  . furosemide (LASIX) 40 MG tablet Take 1 tablet (40 mg total) by mouth daily. 30 tablet 11  . lisinopril (PRINIVIL,ZESTRIL) 5 MG tablet TAKE ONE TABLET BY MOUTH ONCE DAILY 90 tablet 2  . metFORMIN (GLUCOPHAGE) 500 MG tablet Take 1,000 mg by mouth daily with breakfast.     . Multiple Vitamins-Minerals (MULTIVITAMIN WITH MINERALS) tablet Take 1 tablet by mouth daily.    Marland Kitchen amiodarone (PACERONE) 200 MG tablet Take 1 tablet (200 mg total) by mouth daily. 90 tablet 2   No facility-administered medications prior to visit.      Allergies:   Patient has no known allergies.   Social History   Social History  . Marital status: Married    Spouse name: N/A  . Number of children: N/A  . Years of education: N/A   Social History Main Topics  . Smoking status: Never Smoker  . Smokeless tobacco: Never Used  . Alcohol use No  . Drug use: No  . Sexual activity: No   Other Topics Concern  . None   Social History Narrative  . None     Family History:  The patient's family history is not on file.   ROS:   Please see the history of present illness.    Easy bruising.  All other systems reviewed and are negative.   PHYSICAL EXAM:   VS:  BP (!) 146/80 (BP Location: Right Arm, Patient Position:  Sitting, Cuff Size: Normal)   Pulse (!) 50   Ht  (1.803 m)   Wt 211 lb 12.8 oz (96.1 kg)   SpO2 98%   BMI 29.54 kg/m    GEN: Well nourished, well developed, in no acute distress  HEENT: normal  Neck: no JVD, carotid bruits, or masses Cardiac: RRR; no murmurs, rubs, or gallops,no edema  Respiratory:  clear to auscultation bilaterally, normal work of breathing GI: soft, nontender, nondistended, + BS MS: no deformity or atrophy  Skin: warm and dry, no rash Neuro:  Alert and Oriented x 3, Strength and sensation are intact Psych: euthymic mood, full affect  Wt Readings from Last 3 Encounters:  07/23/16 211 lb 12.8 oz (96.1 kg)  01/11/16 211 lb 6.4 oz (95.9 kg)  11/30/15 210 lb  1.9 oz (95.3 kg)      Studies/Labs Reviewed:   EKG:  EKG  Not repeated  Recent Labs: 07/29/2015: B Natriuretic Peptide 360.6 11/12/2015: BUN 28; Creat 1.17; Hemoglobin 14.3; Platelets 236; Potassium 4.5; Sodium 139 01/11/2016: ALT 12; TSH 2.83   Lipid Panel    Component Value Date/Time   CHOL 130 07/29/2015 1857   TRIG 61 07/29/2015 1857   HDL 30 (L) 07/29/2015 1857   CHOLHDL 4.3 07/29/2015 1857   VLDL 12 07/29/2015 1857   LDLCALC 88 07/29/2015 1857    Additional studies/ records that were reviewed today include:  No new data    ASSESSMENT:    1. Chronic diastolic heart failure (HCC)   2. Paroxysmal atrial fibrillation (HCC)   3. On amiodarone therapy   4. Chronic anticoagulation      PLAN:  In order of problems listed above:  1. No evidence of volume overload. We will perform a comprehensive metabolic panel today. 2. Asymptomatic with reference to coronary symptoms. 3. Maintaining sinus rhythm on amiodarone 200 mg per day. Plan today is to decrease amiodarone to 100 mg daily. Amiodarone toxicity will be evaluated for by a TSH and comprehensive metabolic panel today. 4. No bleeding complications on Apixiban.  Overall doing well. Hopefully the decrease in amiodarone dose will decrease possibility of toxicity but help maintain normal sinus rhythm. Long-term anticoagulation will be continued. Clinical follow-up in 4-6 months. Instructed to call if 2, dyspnea, or swelling.  Medication Adjustments/Labs and Tests Ordered: Current medicines are reviewed at length with the patient today.  Concerns regarding medicines are outlined above.  Medication changes, Labs and Tests ordered today are listed in the Patient Instructions below. There are no Patient Instructions on file for this visit.   Signed, Lesleigh Noe, MD  07/23/2016 3:58 PM    [facility] [address] [place], [place], [place]  [address] Phone: [phone]; Fax: [phone]

## 2016-07-24 LAB — COMPREHENSIVE METABOLIC PANEL
A/G RATIO: 1.7 (ref 1.2–2.2)
ALT: 22 IU/L (ref 0–44)
AST: 28 IU/L (ref 0–40)
Albumin: 4.8 g/dL (ref 3.5–4.8)
Alkaline Phosphatase: 94 IU/L (ref 39–117)
BILIRUBIN TOTAL: 0.4 mg/dL (ref 0.0–1.2)
BUN/Creatinine Ratio: 23 (ref 10–24)
BUN: 32 mg/dL — ABNORMAL HIGH (ref 8–27)
CHLORIDE: 96 mmol/L (ref 96–106)
CO2: 27 mmol/L (ref 18–29)
Calcium: 10.4 mg/dL — ABNORMAL HIGH (ref 8.6–10.2)
Creatinine, Ser: 1.38 mg/dL — ABNORMAL HIGH (ref 0.76–1.27)
GFR calc Af Amer: 57 mL/min/{1.73_m2} — ABNORMAL LOW (ref >59)
GFR calc non Af Amer: 49 mL/min/{1.73_m2} — ABNORMAL LOW (ref >59)
GLUCOSE: 122 mg/dL — AB (ref 65–99)
Globulin, Total: 2.9 g/dL (ref 1.5–4.5)
POTASSIUM: 4.9 mmol/L (ref 3.5–5.2)
Sodium: 139 mmol/L (ref 134–144)
Total Protein: 7.7 g/dL (ref 6.0–8.5)

## 2016-07-24 LAB — TSH: TSH: 3.25 u[IU]/mL (ref 0.450–4.500)

## 2016-07-30 ENCOUNTER — Encounter (HOSPITAL_COMMUNITY): Payer: Self-pay

## 2016-07-30 ENCOUNTER — Emergency Department (HOSPITAL_COMMUNITY): Payer: Medicare Other

## 2016-07-30 ENCOUNTER — Observation Stay (HOSPITAL_COMMUNITY)
Admission: EM | Admit: 2016-07-30 | Discharge: 2016-07-31 | Disposition: A | Discharge status: Home / Self Care | Payer: Medicare Other | Attending: Internal Medicine | Admitting: Internal Medicine

## 2016-07-30 DIAGNOSIS — Z7984 Long term (current) use of oral hypoglycemic drugs: Secondary | ICD-10-CM | POA: Insufficient documentation

## 2016-07-30 DIAGNOSIS — I2510 Atherosclerotic heart disease of native coronary artery without angina pectoris: Secondary | ICD-10-CM | POA: Diagnosis not present

## 2016-07-30 DIAGNOSIS — E1165 Type 2 diabetes mellitus with hyperglycemia: Secondary | ICD-10-CM | POA: Diagnosis not present

## 2016-07-30 DIAGNOSIS — E1159 Type 2 diabetes mellitus with other circulatory complications: Secondary | ICD-10-CM | POA: Diagnosis not present

## 2016-07-30 DIAGNOSIS — A419 Sepsis, unspecified organism: Secondary | ICD-10-CM | POA: Diagnosis not present

## 2016-07-30 DIAGNOSIS — I10 Essential (primary) hypertension: Secondary | ICD-10-CM | POA: Diagnosis not present

## 2016-07-30 DIAGNOSIS — I110 Hypertensive heart disease with heart failure: Principal | ICD-10-CM | POA: Insufficient documentation

## 2016-07-30 DIAGNOSIS — N179 Acute kidney failure, unspecified: Secondary | ICD-10-CM | POA: Diagnosis not present

## 2016-07-30 DIAGNOSIS — R6510 Systemic inflammatory response syndrome (SIRS) of non-infectious origin without acute organ dysfunction: Secondary | ICD-10-CM | POA: Diagnosis present

## 2016-07-30 DIAGNOSIS — E860 Dehydration: Secondary | ICD-10-CM | POA: Diagnosis not present

## 2016-07-30 DIAGNOSIS — R110 Nausea: Secondary | ICD-10-CM | POA: Diagnosis not present

## 2016-07-30 DIAGNOSIS — R5383 Other fatigue: Secondary | ICD-10-CM | POA: Diagnosis present

## 2016-07-30 DIAGNOSIS — R509 Fever, unspecified: Secondary | ICD-10-CM | POA: Diagnosis not present

## 2016-07-30 DIAGNOSIS — Z7901 Long term (current) use of anticoagulants: Secondary | ICD-10-CM | POA: Diagnosis not present

## 2016-07-30 DIAGNOSIS — D72829 Elevated white blood cell count, unspecified: Secondary | ICD-10-CM | POA: Diagnosis present

## 2016-07-30 DIAGNOSIS — D72825 Bandemia: Secondary | ICD-10-CM

## 2016-07-30 DIAGNOSIS — Z79899 Other long term (current) drug therapy: Secondary | ICD-10-CM | POA: Diagnosis not present

## 2016-07-30 DIAGNOSIS — E119 Type 2 diabetes mellitus without complications: Secondary | ICD-10-CM | POA: Diagnosis not present

## 2016-07-30 DIAGNOSIS — I480 Paroxysmal atrial fibrillation: Secondary | ICD-10-CM | POA: Diagnosis present

## 2016-07-30 DIAGNOSIS — I5032 Chronic diastolic (congestive) heart failure: Secondary | ICD-10-CM | POA: Diagnosis present

## 2016-07-30 DIAGNOSIS — Z8249 Family history of ischemic heart disease and other diseases of the circulatory system: Secondary | ICD-10-CM | POA: Diagnosis not present

## 2016-07-30 LAB — HEPATIC FUNCTION PANEL
ALK PHOS: 72 U/L (ref 38–126)
ALT: 20 U/L (ref 17–63)
AST: 33 U/L (ref 15–41)
Albumin: 4.4 g/dL (ref 3.5–5.0)
BILIRUBIN DIRECT: 0.2 mg/dL (ref 0.1–0.5)
BILIRUBIN INDIRECT: 0.8 mg/dL (ref 0.3–0.9)
BILIRUBIN TOTAL: 1 mg/dL (ref 0.3–1.2)
Total Protein: 7.3 g/dL (ref 6.5–8.1)

## 2016-07-30 LAB — CBC WITH DIFFERENTIAL/PLATELET
BASOS PCT: 0 %
Basophils Absolute: 0 10*3/uL (ref 0.0–0.1)
EOS ABS: 0 10*3/uL (ref 0.0–0.7)
EOS PCT: 0 %
HCT: 38.8 % — ABNORMAL LOW (ref 39.0–52.0)
Hemoglobin: 13 g/dL (ref 13.0–17.0)
LYMPHS ABS: 3 10*3/uL (ref 0.7–4.0)
Lymphocytes Relative: 13 %
MCH: 29.9 pg (ref 26.0–34.0)
MCHC: 33.5 g/dL (ref 30.0–36.0)
MCV: 89.2 fL (ref 78.0–100.0)
MONOS PCT: 5 %
Monocytes Absolute: 1.2 10*3/uL — ABNORMAL HIGH (ref 0.1–1.0)
Neutro Abs: 18.9 10*3/uL — ABNORMAL HIGH (ref 1.7–7.7)
Neutrophils Relative %: 82 %
PLATELETS: 204 10*3/uL (ref 150–400)
RBC: 4.35 MIL/uL (ref 4.22–5.81)
RDW: 13 % (ref 11.5–15.5)
WBC: 23.1 10*3/uL — ABNORMAL HIGH (ref 4.0–10.5)

## 2016-07-30 LAB — GLUCOSE, CAPILLARY: Glucose-Capillary: 153 mg/dL — ABNORMAL HIGH (ref 65–99)

## 2016-07-30 LAB — PROCALCITONIN: PROCALCITONIN: 0.37 ng/mL

## 2016-07-30 LAB — BASIC METABOLIC PANEL
Anion gap: 13 (ref 5–15)
BUN: 25 mg/dL — ABNORMAL HIGH (ref 6–20)
CO2: 25 mmol/L (ref 22–32)
Calcium: 9.9 mg/dL (ref 8.9–10.3)
Chloride: 98 mmol/L — ABNORMAL LOW (ref 101–111)
Creatinine, Ser: 1.34 mg/dL — ABNORMAL HIGH (ref 0.61–1.24)
GFR calc Af Amer: 58 mL/min — ABNORMAL LOW (ref >60)
GFR, EST NON AFRICAN AMERICAN: 50 mL/min — AB (ref >60)
GLUCOSE: 180 mg/dL — AB (ref 65–99)
POTASSIUM: 4.7 mmol/L (ref 3.5–5.1)
Sodium: 136 mmol/L (ref 135–145)

## 2016-07-30 LAB — URINALYSIS, ROUTINE W REFLEX MICROSCOPIC
Bilirubin Urine: NEGATIVE
GLUCOSE, UA: NEGATIVE mg/dL
HGB URINE DIPSTICK: NEGATIVE
KETONES UR: NEGATIVE mg/dL
Leukocytes, UA: NEGATIVE
Nitrite: NEGATIVE
PROTEIN: NEGATIVE mg/dL
Specific Gravity, Urine: 1.016 (ref 1.005–1.030)
pH: 5 (ref 5.0–8.0)

## 2016-07-30 LAB — I-STAT CG4 LACTIC ACID, ED
LACTIC ACID, VENOUS: 2.69 mmol/L — AB (ref 0.5–1.9)
Lactic Acid, Venous: 4.15 mmol/L (ref 0.5–1.9)

## 2016-07-30 LAB — CBC
HEMATOCRIT: 42.4 % (ref 39.0–52.0)
HEMOGLOBIN: 14.5 g/dL (ref 13.0–17.0)
MCH: 30.4 pg (ref 26.0–34.0)
MCHC: 34.2 g/dL (ref 30.0–36.0)
MCV: 88.9 fL (ref 78.0–100.0)
PLATELETS: 248 10*3/uL (ref 150–400)
RBC: 4.77 MIL/uL (ref 4.22–5.81)
RDW: 12.8 % (ref 11.5–15.5)
WBC: 25.2 10*3/uL — ABNORMAL HIGH (ref 4.0–10.5)

## 2016-07-30 LAB — LACTIC ACID, PLASMA: Lactic Acid, Venous: 2.1 mmol/L (ref 0.5–1.9)

## 2016-07-30 LAB — I-STAT TROPONIN, ED: TROPONIN I, POC: 0 ng/mL (ref 0.00–0.08)

## 2016-07-30 LAB — TROPONIN I: Troponin I: <0.03 ng/mL (ref <0.03)

## 2016-07-30 MED ORDER — ACETAMINOPHEN 325 MG PO TABS: 650.0000 mg | ORAL_TABLET | Freq: Four times a day (QID) | ORAL | Status: DC | PRN

## 2016-07-30 MED ORDER — SODIUM CHLORIDE 0.9% FLUSH: 3.0000 mL | Freq: Two times a day (BID) | INTRAVENOUS | Status: DC

## 2016-07-30 MED ORDER — ONDANSETRON HCL 4 MG/2ML IJ SOLN: 4.0000 mg | Freq: Four times a day (QID) | INTRAMUSCULAR | Status: DC | PRN

## 2016-07-30 MED ORDER — SODIUM CHLORIDE 0.9 % IV BOLUS (SEPSIS)
1000.0000 mL | Freq: Once | INTRAVENOUS | Status: AC
  Administered 2016-07-30: 1000 mL via INTRAVENOUS

## 2016-07-30 MED ORDER — VANCOMYCIN HCL IN DEXTROSE 750-5 MG/150ML-% IV SOLN
750.0000 mg | Freq: Two times a day (BID) | INTRAVENOUS | Status: DC
  Administered 2016-07-31: 750 mg via INTRAVENOUS
  Filled 2016-07-30 (×2): qty 150

## 2016-07-30 MED ORDER — ATORVASTATIN CALCIUM 40 MG PO TABS
40.0000 mg | ORAL_TABLET | Freq: Every day | ORAL | Status: DC
  Administered 2016-07-30: 40 mg via ORAL
  Filled 2016-07-30: qty 1

## 2016-07-30 MED ORDER — LORATADINE 10 MG PO TABS
10.0000 mg | ORAL_TABLET | Freq: Every day | ORAL | Status: DC
  Administered 2016-07-31: 10 mg via ORAL
  Filled 2016-07-30: qty 1

## 2016-07-30 MED ORDER — ONDANSETRON HCL 4 MG PO TABS: 4.0000 mg | ORAL_TABLET | Freq: Four times a day (QID) | ORAL | Status: DC | PRN

## 2016-07-30 MED ORDER — PIPERACILLIN-TAZOBACTAM 3.375 G IVPB
3.3750 g | Freq: Three times a day (TID) | INTRAVENOUS | Status: DC
  Administered 2016-07-31: 3.375 g via INTRAVENOUS
  Filled 2016-07-30 (×2): qty 50

## 2016-07-30 MED ORDER — HYDROCODONE-ACETAMINOPHEN 5-325 MG PO TABS: 1.0000 | ORAL_TABLET | ORAL | Status: DC | PRN

## 2016-07-30 MED ORDER — ACETAMINOPHEN 650 MG RE SUPP: 650.0000 mg | Freq: Four times a day (QID) | RECTAL | Status: DC | PRN

## 2016-07-30 MED ORDER — ALBUTEROL SULFATE (2.5 MG/3ML) 0.083% IN NEBU: 2.5000 mg | INHALATION_SOLUTION | RESPIRATORY_TRACT | Status: DC | PRN

## 2016-07-30 MED ORDER — PIPERACILLIN-TAZOBACTAM 3.375 G IVPB 30 MIN
3.3750 g | Freq: Once | INTRAVENOUS | Status: AC
  Administered 2016-07-30: 3.375 g via INTRAVENOUS
  Filled 2016-07-30: qty 50

## 2016-07-30 MED ORDER — SODIUM CHLORIDE 0.9 % IV BOLUS (SEPSIS)
500.0000 mL | Freq: Once | INTRAVENOUS | Status: AC
  Administered 2016-07-30: 500 mL via INTRAVENOUS

## 2016-07-30 MED ORDER — VANCOMYCIN HCL IN DEXTROSE 1-5 GM/200ML-% IV SOLN: 1000.0000 mg | Freq: Once | INTRAVENOUS | Status: DC

## 2016-07-30 MED ORDER — SODIUM CHLORIDE 0.9 % IV SOLN
2000.0000 mg | Freq: Once | INTRAVENOUS | Status: AC
  Administered 2016-07-30: 2000 mg via INTRAVENOUS
  Filled 2016-07-30: qty 2000

## 2016-07-30 MED ORDER — SODIUM CHLORIDE 0.9 % IV SOLN
INTRAVENOUS | Status: AC
  Administered 2016-07-30: 21:00:00 via INTRAVENOUS

## 2016-07-30 MED ORDER — CARVEDILOL 6.25 MG PO TABS
6.2500 mg | ORAL_TABLET | Freq: Two times a day (BID) | ORAL | Status: DC
  Administered 2016-07-30 – 2016-07-31 (×2): 6.25 mg via ORAL
  Filled 2016-07-30 (×2): qty 1

## 2016-07-30 MED ORDER — SODIUM CHLORIDE 0.9 % IV BOLUS (SEPSIS): 1000.0000 mL | Freq: Once | INTRAVENOUS | Status: DC

## 2016-07-30 MED ORDER — GUAIFENESIN ER 600 MG PO TB12
600.0000 mg | ORAL_TABLET | Freq: Two times a day (BID) | ORAL | Status: DC
  Administered 2016-07-30 – 2016-07-31 (×2): 600 mg via ORAL
  Filled 2016-07-30 (×2): qty 1

## 2016-07-30 MED ORDER — APIXABAN 5 MG PO TABS
5.0000 mg | ORAL_TABLET | Freq: Two times a day (BID) | ORAL | Status: DC
  Administered 2016-07-30 – 2016-07-31 (×2): 5 mg via ORAL
  Filled 2016-07-30 (×2): qty 1

## 2016-07-30 MED ORDER — AMIODARONE HCL 200 MG PO TABS
100.0000 mg | ORAL_TABLET | Freq: Every day | ORAL | Status: DC
  Administered 2016-07-31: 100 mg via ORAL
  Filled 2016-07-30: qty 1

## 2016-07-30 NOTE — ED Notes (Signed)
EDP notified this RN to hang instead of 3000, per protocol. infused prior to EDP activating Code Sepsis.

## 2016-07-30 NOTE — Progress Notes (Signed)
CRITICAL VALUE ALERT  Critical value received: Lactic Acid 2.1   Date of notification:  07/30/2016  Time of notification:  2146   Critical value read back:Yes.    Nurse who received alert:  Chancy Milroy   MD notified (1st page):  Kirtland Bouchard Schorr   Time of first page:  2149  MD notified (2nd page):  Time of second page:  Responding MD:  Merdis Delay   Time MD responded:  2226

## 2016-07-30 NOTE — H&P (Signed)
Jason [name] ZOX:[id number] DOB: Feb 21, 1941 DOA: 07/30/2016     PCP: Tanna Furry, PA-C   Outpatient Specialists: Lauraine Rinne  Patient coming from: home Lives  With family Wife    Chief Complaint: cough and fever, fatigue  HPI: Jason [name] is a 76 y.o. male with medical history significant of Diastolic CH,  Afib on  anticoagulation with abixaban, HTN,  CAD last Cath august 2017, DM 2, fatty liver infiltration  Presented with advice fatigue since yesterday reports he is just not been feeling well for the past 24 hours or more he presented today to urgent care was found to be febrile up to 101. Urgent care they obtain chest x-ray and flu swab which reportedly was negative and sent patient to emergency department had some nausea and dry heaves but no vomiting. He's been having some intermittent nonproductive cough. Overall he feels somewhat better today and he did yesterday. He hasn't had any chest pain no abdominal pain no headache. Denies melena no black stools. In emergency department he received IV fluids and started to feel better.   Reports no sick contacts.  Denies any diarrhea   Regarding pertinent Chronic problems: A.fib sp DC cardioversion 08/28/15,   Hx of CAD Nuclear stress test was performed on 10/01/15 which is a low risk study EF 55% there was a small defect of moderate severity in the mid anterior septal and apical septal location. Mostly infarct with very mild peri-infarct ischemia. There was a small defect of moderate severity in the apical lateral location consistent with infarct. Dr. Katrinka Blazing reviewed his stress test in chart in detail recommends the patient have a cardiac catheterization. Recent cardiac catheterization done on 11/16/2015 demonstrated moderate coronary disease and return of LVEF to 55%. Diabetes controlled with metformin.  IN ER:  Temp (24hrs), Avg:99 F (37.2 C), Min:98.7 F (37.1 C), Max:99.3 F (37.4 C)    Meeting sepsis admission criteria for  respirations 27 reported fever up to 101, white blood cell count 25.2. Lactic acid initially 2.69 but after IV fluids only went up to 4.15 Latest blood pressure 128/57 heart rate 73 satting 96% on room air.   Trop 0.00 Cr 1.34 which is slightly above baseline 1.17 but similar to creatinine last physical 1.38  CXR - unremarkable Following Medications were ordered in ER: Medications  sodium chloride 0.9 % bolus 1,000 mL (1,000 mLs Intravenous New Bag/Given 07/30/16 1841)    And  sodium chloride 0.9 % bolus 1,000 mL (not administered)    And  sodium chloride 0.9 % bolus 1,000 mL (not administered)  piperacillin-tazobactam (ZOSYN) IVPB 3.375 g (not administered)  vancomycin (VANCOCIN) 2,000 mg in sodium chloride 0.9 % 500 mL IVPB (not administered)  sodium chloride 0.9 % bolus 1,000 mL (0 mLs Intravenous Stopped 07/30/16 1719)       Hospitalist was called for admission for SIRS  Review of Systems:    Pertinent positives include:  Fevers, chills, fatigue, nausea,   non-productive cough, Constitutional:  No weight loss, night sweats,weight loss  HEENT:  No headaches, Difficulty swallowing,Tooth/dental problems,Sore throat,  No sneezing, itching, ear ache, nasal congestion, post nasal drip,  Cardio-vascular:  No chest pain, Orthopnea, PND, anasarca, dizziness, palpitations.no Bilateral lower extremity swelling  GI:  No heartburn, indigestion, abdominal pain, vomiting, diarrhea, change in bowel habits, loss of appetite, melena, blood in stool, hematemesis Resp:  no shortness of breath at rest. No dyspnea on exertion, No excess mucus, no productive cough, No No coughing up of  blood.No change in color of mucus.No wheezing. Skin:  no rash or lesions. No jaundice GU:  no dysuria, change in color of urine, no urgency or frequency. No straining to urinate.  No flank pain.  Musculoskeletal:  No joint pain or no joint swelling. No decreased range of motion. No back pain.  Psych:  No  change in mood or affect. No depression or anxiety. No memory loss.  Neuro: no localizing neurological complaints, no tingling, no weakness, no double vision, no gait abnormality, no slurred speech, no confusion  As per HPI otherwise 10 point review of systems negative.   Past Medical History: Past Medical History:  Diagnosis Date  . Atrial fibrillation (HCC)   . Diabetes mellitus without complication (HCC)   . Hypertension    Past Surgical History:  Procedure Laterality Date  . CARDIAC CATHETERIZATION N/A 11/16/2015   Procedure: Left Heart Cath and Coronary Angiography;  Surgeon: Lyn Records, MD;  Location: [facility] INVASIVE CV LAB;  Service: Cardiovascular;  Laterality: N/A;  . CARDIOVERSION N/A 08/28/2015   Procedure: CARDIOVERSION;  Surgeon: Lewayne Bunting, MD;  Location: [facility] ENDOSCOPY;  Service: Cardiovascular;  Laterality: N/A;  . HERNIA REPAIR    . KIDNEY STONE SURGERY       Social History:  Ambulatory   Independently     reports that he has never smoked. He has never used smokeless tobacco. He reports that he does not drink alcohol or use drugs.  Allergies:  No Known Allergies     Family History:   Family History  Problem Relation Age of Onset  . Heart attack Neg Hx   . Hypertension Neg Hx     Medications: Prior to Admission medications   Medication Sig Start Date End Date Taking? Authorizing Provider  acetaminophen (TYLENOL) 325 MG tablet Take 325-650 mg by mouth every 6 (six) hours as needed for mild pain.    Yes Historical Provider, MD  amiodarone (PACERONE) 200 MG tablet Take 0.5 tablets (100 mg total) by mouth daily. 07/23/16  Yes Lyn Records, MD  apixaban (ELIQUIS) 5 MG TABS tablet Take 1 tablet (5 mg total) by mouth 2 (two) times daily. 10/30/15  Yes Lyn Records, MD  atorvastatin (LIPITOR) 40 MG tablet Take 1 tablet (40 mg total) by mouth daily at 6 PM. 09/26/15  Yes Lyn Records, MD  carvedilol (COREG) 6.25 MG tablet Take 1 tablet (6.25 mg total) by mouth  2 (two) times daily. 09/20/15  Yes Brittainy Sherlynn Carbon, PA-C  cetirizine (ZYRTEC) 10 MG tablet Take 10 mg by mouth daily as needed for allergies.   Yes Historical Provider, MD  furosemide (LASIX) 40 MG tablet Take 1 tablet (40 mg total) by mouth daily. 12/24/15  Yes Lyn Records, MD  lisinopril (PRINIVIL,ZESTRIL) 5 MG tablet TAKE ONE TABLET BY MOUTH ONCE DAILY 05/20/16  Yes Dyann Kief, PA-C  metFORMIN (GLUCOPHAGE) 1000 MG tablet Take 1,000 mg by mouth 2 (two) times daily.   Yes Historical Provider, MD  Multiple Vitamins-Minerals (CENTRUM SILVER 50+MEN) TABS Take 1 tablet by mouth daily.   Yes Historical Provider, MD    Physical Exam: Patient Vitals for the past 24 hrs:  BP Temp Temp src Pulse Resp SpO2 Height Weight  07/30/16 1800 (!) 117/52 99.3 F (37.4 C) Oral 80 (!) 25 95 % - -  07/30/16 1745 114/66 - - 85 20 96 % - -  07/30/16 1730 (!) 113/57 - - 79 19 94 % - -  07/30/16  1722 (!) 112/57 - - 80 (!) 23 95 % - -  07/30/16 1600 127/63 - - 91 (!) 22 94 % - -  07/30/16 1530 128/70 - - 86 (!) 23 94 % - -  07/30/16 1418 125/65 98.7 F (37.1 C) Oral 91 18 93 %  (1.803 m) 96.9 kg (213 lb 9.6 oz)    1. General:  in No Acute distress 2. Psychological: Alert and  Oriented 3. Head/ENT:   Dry Mucous Membranes                          Head Non traumatic, neck supple                          Normal   Dentition 4. SKIN:  decreased Skin turgor,  Skin clean Dry and intact no rash 5. Heart: Regular rate and rhythm no  Murmur, Rub or gallop 6. Lungs:   no wheezes or crackles   7. Abdomen: Soft, non-tender, Non distended 8. Lower extremities: no clubbing, cyanosis, or edema 9. Neurologically Grossly intact, moving all 4 extremities equally   10. MSK: Normal range of motion   body mass index is 29.79 kg/m.  Labs on Admission:   Labs on Admission: I have personally reviewed following labs and imaging studies  CBC:  Recent Labs Lab 07/30/16 1424  WBC 25.2*  HGB 14.5  HCT 42.4    MCV 88.9  PLT 248   Basic Metabolic Panel:  Recent Labs Lab 07/30/16 1424  NA 136  K 4.7  CL 98*  CO2 25  GLUCOSE 180*  BUN 25*  CREATININE 1.34*  CALCIUM 9.9   GFR: Estimated Creatinine Clearance: 55.7 mL/min (A) (by C-G formula based on SCr of 1.34 mg/dL (H)). Liver Function Tests:  Recent Labs Lab 07/30/16 1534  AST 33  ALT 20  ALKPHOS 72  BILITOT 1.0  PROT 7.3  ALBUMIN 4.4   No results for input(s): LIPASE, AMYLASE in the last 168 hours. No results for input(s): AMMONIA in the last 168 hours. Coagulation Profile: No results for input(s): INR, PROTIME in the last 168 hours. Cardiac Enzymes: No results for input(s): CKTOTAL, CKMB, CKMBINDEX, TROPONINI in the last 168 hours. BNP (last 3 results) No results for input(s): PROBNP in the last 8760 hours. HbA1C: No results for input(s): HGBA1C in the last 72 hours. CBG: No results for input(s): GLUCAP in the last 168 hours. Lipid Profile: No results for input(s): CHOL, HDL, LDLCALC, TRIG, CHOLHDL, LDLDIRECT in the last 72 hours. Thyroid Function Tests: No results for input(s): TSH, T4TOTAL, FREET4, T3FREE, THYROIDAB in the last 72 hours. Anemia Panel: No results for input(s): VITAMINB12, FOLATE, FERRITIN, TIBC, IRON, RETICCTPCT in the last 72 hours. Urine analysis:    Component Value Date/Time   COLORURINE AMBER (A) 07/30/2016 1552   APPEARANCEUR CLOUDY (A) 07/30/2016 1552   LABSPEC 1.016 07/30/2016 1552   PHURINE 5.0 07/30/2016 1552   GLUCOSEU NEGATIVE 07/30/2016 1552   HGBUR NEGATIVE 07/30/2016 1552   BILIRUBINUR NEGATIVE 07/30/2016 1552   KETONESUR NEGATIVE 07/30/2016 1552   PROTEINUR NEGATIVE 07/30/2016 1552   NITRITE NEGATIVE 07/30/2016 1552   LEUKOCYTESUR NEGATIVE 07/30/2016 1552   Sepsis Labs: (procalcitonin:4,lacticidven:4) )No results found for this or any previous visit (from the past 240 hour(s)).    UA    no evidence of UTI     Lab Results  Component Value Date   HGBA1C 7.2  (  H) 07/29/2015    Estimated Creatinine Clearance: 55.7 mL/min (A) (by C-G formula based on SCr of 1.34 mg/dL (H)).  BNP (last 3 results) No results for input(s): PROBNP in the last 8760 hours.   ECG REPORT  Independently reviewed Rate:89  Rhythm: SR with incomplete right bundle branch block  ST&T Change: No acute ischemic changes   QTC 474  Filed Weights   07/30/16 1418  Weight: 96.9 kg (213 lb 9.6 oz)     Cultures: No results found for: SDES, SPECREQUEST, CULT, REPTSTATUS   Radiological Exams on Admission: Dg Chest 2 View  Result Date: 07/30/2016 CLINICAL DATA:  Nausea and chest congestion over the last few days. EXAM: CHEST  2 VIEW COMPARISON:  07/29/2015 FINDINGS: Heart size is normal. There is aortic atherosclerosis. The lungs are clear. No infiltrate, collapse or effusion. Ordinary degenerative changes affect the spine. IMPRESSION: No active cardiopulmonary disease.  Aortic atherosclerosis. Electronically Signed   By: Paulina Fusi M.D.   On: 07/30/2016 16:48    Chart has been reviewed    Assessment/Plan 76 y.o. male with medical history significant of Diastolic CH,  Afib on  anticoagulation with abixaban, HTN,  CAD last Cath august 2017, DM 2, fatty liver infiltration admitted for SIRS and lactic acidemia  Present on Admission: . Paroxysmal atrial fibrillation (HCC)           - CHA2DS2 vas score 6 : continue current anticoagulation with  Eliquis             currently in sinus        -  Rate control:  Currently controlled with coreg  will continue        - Rhythm control:  Continue amiodarone      . HTN (hypertension) - given some soft BP in Er will hold lisinopril and Lasix . Chronic diastolic heart failure (HCC) - possibly mildly dehydrated given slight increased in cr and soft BP will rehydrate hold lasix monitor fluid status . Leukocytosis - possibly secondary to underlying infection will obtain CBC with differential . SIRS (systemic inflammatory response  syndrome) (HCC) - unclear source UA and chest x-ray showing no evidence of infections. Given mild nausea and generalized malaise and nonspecific symptoms possible viral etiology we'll order respiratory panel obtained blood and urine cultures for right now, brain with vancomycin and Zosyn pending results of further workup. Obtain MRSA screening adjust antibiotics coverage pending results Diabetes mellitus - will order sliding scale hold metformin diabetic diet ordered Coronary artery disease no chest pain but patient had unexplained nausea given history of diabetes possibility of silent or atypical angina will cycle cardiac enzymes Lactic acidemia will rehydrate and continue to cycle lactic acid patient clinically improving although not back to baseline  Other plan as per orders.  DVT prophylaxis:  eliquis  Code Status:  FULL CODE   as per patient   Family Communication:   Family   at  Bedside  plan of care was discussed with  Daughter,  Disposition Plan:    To home once workup is complete and patient is stable                                                   Consults called:none   Admission status:   obs given that patient is improving likely will be able to be  discharged in next 24 hours   Level of care    tele       I have spent a total of 56 min on this admission  , 07/30/2016, 7:56 PM    [facility]  Pager [phone]   after 2 AM please page floor coverage PA If 7AM-7PM, please contact the day team taking care of the patient  [email or web address]  Password [email or web address]

## 2016-07-30 NOTE — ED Provider Notes (Signed)
[facility]-EMERGENCY DEPT Provider Note   CSN: [id number] Arrival date & time: 07/30/16  1414     History   Chief Complaint Chief Complaint  Patient presents with  . Weakness    HPI Jason [name] is a 76 y.o. male.Complains of generalized weakness and fatigue onset yesterday. He reports nausea and had dry heaves yesterday as well as nonproductive cough. He he feels better today over yesterday, without treatment. He was seen in urgent care center earlier today and sent here for further evaluation reportedly had temperature of 101. He denies chest pain denies abdominal pain denies headache nothing makes symptoms better or worse. No blood in stools no black stools. No other associated symptoms. Presently hungry  HPI  Past Medical History:  Diagnosis Date  . Atrial fibrillation (HCC)   . Diabetes mellitus without complication (HCC)   . Hypertension     Patient Active Problem List   Diagnosis Date Noted  . On amiodarone therapy 07/23/2016  . Chronic anticoagulation 07/23/2016  . Abnormal nuclear stress test 10/29/2015  . Cardiomyopathy (HCC) 10/29/2015  . Chronic diastolic heart failure (HCC) 08/10/2015  . Coronary artery calcification seen on CT scan 08/10/2015  . Cirrhosis (HCC)   . Respiratory distress 07/29/2015  . T2DM (type 2 diabetes mellitus) (HCC) 07/29/2015  . HTN (hypertension) 07/29/2015  . Paroxysmal atrial fibrillation (HCC) 07/29/2015  . Acute respiratory distress 07/29/2015    Past Surgical History:  Procedure Laterality Date  . CARDIAC CATHETERIZATION N/A 11/16/2015   Procedure: Left Heart Cath and Coronary Angiography;  Surgeon: Lyn Records, MD;  Location: [facility] INVASIVE CV LAB;  Service: Cardiovascular;  Laterality: N/A;  . CARDIOVERSION N/A 08/28/2015   Procedure: CARDIOVERSION;  Surgeon: Lewayne Bunting, MD;  Location: [facility] ENDOSCOPY;  Service: Cardiovascular;  Laterality: N/A;  . HERNIA REPAIR    . KIDNEY STONE SURGERY         Home Medications    Prior  to Admission medications   Medication Sig Start Date End Date Taking? Authorizing Provider  acetaminophen (TYLENOL) 325 MG tablet Take 650 mg by mouth every 6 (six) hours as needed for mild pain.    Historical Provider, MD  amiodarone (PACERONE) 200 MG tablet Take 0.5 tablets (100 mg total) by mouth daily. 07/23/16   Lyn Records, MD  apixaban (ELIQUIS) 5 MG TABS tablet Take 1 tablet (5 mg total) by mouth 2 (two) times daily. 10/30/15   Lyn Records, MD  atorvastatin (LIPITOR) 40 MG tablet Take 1 tablet (40 mg total) by mouth daily at 6 PM. 09/26/15   Lyn Records, MD  carvedilol (COREG) 6.25 MG tablet Take 1 tablet (6.25 mg total) by mouth 2 (two) times daily. 09/20/15   Brittainy Sherlynn Carbon, PA-C  furosemide (LASIX) 40 MG tablet Take 1 tablet (40 mg total) by mouth daily. 12/24/15   Lyn Records, MD  lisinopril (PRINIVIL,ZESTRIL) 5 MG tablet TAKE ONE TABLET BY MOUTH ONCE DAILY 05/20/16   Dyann Kief, PA-C  metFORMIN (GLUCOPHAGE) 500 MG tablet Take 1,000 mg by mouth daily with breakfast.     Historical Provider, MD  Multiple Vitamins-Minerals (MULTIVITAMIN WITH MINERALS) tablet Take 1 tablet by mouth daily.    Historical Provider, MD    Family History Family History  Problem Relation Age of Onset  . Heart attack Neg Hx   . Hypertension Neg Hx     Social History Social History  Substance Use Topics  . Smoking status: Never Smoker  . Smokeless tobacco:  Never Used  . Alcohol use No     Allergies   Patient has no known allergies.   Review of Systems Review of Systems  Constitutional: Positive for fever.  HENT: Negative.   Respiratory: Positive for cough.   Cardiovascular: Negative.   Gastrointestinal: Positive for nausea.  Musculoskeletal: Negative.   Skin: Negative.   Allergic/Immunologic: Positive for immunocompromised state.       Diabetic  Neurological: Positive for weakness.       Generalized weakness  Psychiatric/Behavioral: Negative.   All other systems reviewed and  are negative.    Physical Exam Updated Vital Signs BP 125/65 (BP Location: Left Arm)   Pulse 91   Temp 98.7 F (37.1 C) (Oral)   Resp 18   Ht  (1.803 m)   Wt 213 lb 9.6 oz (96.9 kg)   SpO2 93%   BMI 29.79 kg/m   Physical Exam  Constitutional: He appears well-developed and well-nourished.  HENT:  Head: Normocephalic and atraumatic.  Mucous membranes dry  Eyes: Conjunctivae are normal. Pupils are equal, round, and reactive to light.  Neck: Neck supple. No tracheal deviation present. No thyromegaly present.  Cardiovascular: Normal rate and regular rhythm.   No murmur heard. Pulmonary/Chest: Effort normal and breath sounds normal.  Abdominal: Soft. Bowel sounds are normal. He exhibits no distension. There is no tenderness.  Musculoskeletal: Normal range of motion. He exhibits no edema or tenderness.  Neurological: He is alert. Coordination normal.  Skin: Skin is warm and dry. No rash noted.  Psychiatric: He has a normal mood and affect.  Nursing note and vitals reviewed.    ED Treatments / Results  Labs (all labs ordered are listed, but only abnormal results are displayed) Labs Reviewed  BASIC METABOLIC PANEL - Abnormal; Notable for the following:       Result Value   Chloride 98 (*)    Glucose, Bld 180 (*)    BUN 25 (*)    Creatinine, Ser 1.34 (*)    GFR calc non Af Amer 50 (*)    GFR calc Af Amer 58 (*)    All other components within normal limits  CBC - Abnormal; Notable for the following:    WBC 25.2 (*)    All other components within normal limits  URINALYSIS, ROUTINE W REFLEX MICROSCOPIC  HEPATIC FUNCTION PANEL  CBG MONITORING, ED  I-STAT TROPOININ, ED  I-STAT CG4 LACTIC ACID, ED    EKG  EKG Interpretation  Date/Time:  Wednesday July 30 2016 14:22:50 EDT Ventricular Rate:  89 PR Interval:  164 QRS Duration: 110 QT Interval:  390 QTC Calculation: 474 R Axis:   -14 Text Interpretation:  Normal sinus rhythm Incomplete right bundle branch  block Borderline ECG No significant change since last tracing Confirmed by Ethelda Chick  MD,  [phone]) on 07/30/2016 3:41:05 PM      Results for orders placed or performed during the hospital encounter of 07/30/16  Basic metabolic panel  Result Value Ref Range   Sodium 136 135 - 145 mmol/L   Potassium 4.7 3.5 - 5.1 mmol/L   Chloride 98 (L) 101 - 111 mmol/L   CO2 25 22 - 32 mmol/L   Glucose, Bld 180 (H) 65 - 99 mg/dL   BUN 25 (H) 6 - 20 mg/dL   Creatinine, Ser 1.91 (H) 0.61 - 1.24 mg/dL   Calcium 9.9 8.9 - 47.8 mg/dL   GFR calc non Af Amer 50 (L) >60 mL/min   GFR calc Af Denyse Dago  58 (L) >60 mL/min   Anion gap 13 5 - 15  CBC  Result Value Ref Range   WBC 25.2 (H) 4.0 - 10.5 K/uL   RBC 4.77 4.22 - 5.81 MIL/uL   Hemoglobin 14.5 13.0 - 17.0 g/dL   HCT 52.8 41.3 - 24.4 %   MCV 88.9 78.0 - 100.0 fL   MCH 30.4 26.0 - 34.0 pg   MCHC 34.2 30.0 - 36.0 g/dL   RDW 01.0 27.2 - 53.6 %   Platelets 248 150 - 400 K/uL  Urinalysis, Routine w reflex microscopic  Result Value Ref Range   Color, Urine AMBER (A) YELLOW   APPearance CLOUDY (A) CLEAR   Specific Gravity, Urine 1.016 1.005 - 1.030   pH 5.0 5.0 - 8.0   Glucose, UA NEGATIVE NEGATIVE mg/dL   Hgb urine dipstick NEGATIVE NEGATIVE   Bilirubin Urine NEGATIVE NEGATIVE   Ketones, ur NEGATIVE NEGATIVE mg/dL   Protein, ur NEGATIVE NEGATIVE mg/dL   Nitrite NEGATIVE NEGATIVE   Leukocytes, UA NEGATIVE NEGATIVE  Hepatic function panel  Result Value Ref Range   Total Protein 7.3 6.5 - 8.1 g/dL   Albumin 4.4 3.5 - 5.0 g/dL   AST 33 15 - 41 U/L   ALT 20 17 - 63 U/L   Alkaline Phosphatase 72 38 - 126 U/L   Total Bilirubin 1.0 0.3 - 1.2 mg/dL   Bilirubin, Direct 0.2 0.1 - 0.5 mg/dL   Indirect Bilirubin 0.8 0.3 - 0.9 mg/dL  I-stat troponin, ED  Result Value Ref Range   Troponin i, poc 0.00 0.00 - 0.08 ng/mL   Comment 3          I-Stat CG4 Lactic Acid, ED  Result Value Ref Range   Lactic Acid, Venous 2.69 (HH) 0.5 - 1.9 mmol/L   Comment  NOTIFIED PHYSICIAN   I-Stat CG4 Lactic Acid, ED  Result Value Ref Range   Lactic Acid, Venous 4.15 (HH) 0.5 - 1.9 mmol/L   Comment NOTIFIED PHYSICIAN    Dg Chest 2 View  Result Date: 07/30/2016 CLINICAL DATA:  Nausea and chest congestion over the last few days. EXAM: CHEST  2 VIEW COMPARISON:  07/29/2015 FINDINGS: Heart size is normal. There is aortic atherosclerosis. The lungs are clear. No infiltrate, collapse or effusion. Ordinary degenerative changes affect the spine. IMPRESSION: No active cardiopulmonary disease.  Aortic atherosclerosis. Electronically Signed   By: Paulina Fusi M.D.   On: 07/30/2016 16:48  Chest x-ray viewed by me Radiology No results found.  Procedures Procedures (including critical care time)  Medications Ordered in ED Medications  sodium chloride 0.9 % bolus 1,000 mL (not administered)     Initial Impression / Assessment and Plan / ED Course  I have reviewed the triage vital signs and the nursing notes.  Pertinent labs & imaging results that were available during my care of the patient were reviewed by me and considered in my medical decision making (see chart for details).   6:15 PM after treatment with 1 L of normal saline intravenously patient feels improved he ate well in the emergency department. Of note his lactate is climbing with second lactate being 4.15 and he has marked leukocytosis. Code sepsis called. Empiric antibiotics ordered and additional normal saline IV bolus ordered to total 30 mL/kg Sepsis - Repeat Assessment  Performed at:    615 pm  Vitals     Blood pressure (!) 117/52, pulse 80, temperature 99.3 F (37.4 C), temperature source Oral, resp. rate (!) 25, height   (1.803 m), weight 213 lb 9.6 oz (96.9 kg), SpO2 95 %.  Heart:     Regular rate and rhythm  Lungs:    CTA  Capillary Refill:   <2 sec  Peripheral Pulse:   Radial pulse palpable  Skin:     Normal Color  Elevated creatinine consistent with acute kidney injury over  8 months ago  Dr. Adela Glimpse consulted and will arrange for overnight stay.  Final Clinical Impressions(s) / ED Diagnoses  Diagnosis #1 undifferentiated sepsis #2 acute kidney injury #3 hyperglycemia Final diagnoses:  None    New Prescriptions New Prescriptions   No medications on file     Doug Sou, MD 07/30/16 1856

## 2016-07-30 NOTE — ED Notes (Signed)
Lactic acid result given to Dr. Jacubowitz 

## 2016-07-30 NOTE — ED Triage Notes (Signed)
Pt states he has been having weakness that began yesterday. He reports "I just feel bad." Pt alert and oriented, appears fatigued.

## 2016-07-30 NOTE — Progress Notes (Signed)
Pharmacy Antibiotic Note  Jason [name] is a 76 y.o. male admitted on 07/30/2016 with sepsis.  Pharmacy has been consulted for vancomycin and zosyn dosing.  Plan: Vancomycin *1 Vancomycin 750 IV every 12 hours.  Goal trough 15-20 mcg/mL. Zosyn 3.375g IV q8h (4 hour infusion).  Follow renal function, culture results, and LOT plans  Height:  (180.3 cm) Weight: 213 lb 9.6 oz (96.9 kg) IBW/kg (Calculated) : 75.3  Temp (24hrs), Avg:99 F (37.2 C), Min:98.7 F (37.1 C), Max:99.3 F (37.4 C)   Recent Labs Lab 07/30/16 1424 07/30/16 1606 07/30/16 1812  WBC 25.2*  --   --   CREATININE 1.34*  --   --   LATICACIDVEN  --  2.69* 4.15*    Estimated Creatinine Clearance: 55.7 mL/min (A) (by C-G formula based on SCr of 1.34 mg/dL (H)).    No Known Allergies  Antimicrobials this admission: 4/18 Vanc >>  4/18 Zosyn >>   Dose adjustments this admission:  Microbiology results: 4/18: MRSA: neg  Thank you for allowing pharmacy to be a part of this patient's care.  Sherron Monday 07/30/2016 6:58 PM

## 2016-07-30 NOTE — ED Notes (Signed)
Antibiotics started prior to blood cultures being drawn. Zosyn + vancomycin.

## 2016-07-31 DIAGNOSIS — I110 Hypertensive heart disease with heart failure: Secondary | ICD-10-CM | POA: Diagnosis not present

## 2016-07-31 DIAGNOSIS — I480 Paroxysmal atrial fibrillation: Secondary | ICD-10-CM | POA: Diagnosis not present

## 2016-07-31 DIAGNOSIS — I5032 Chronic diastolic (congestive) heart failure: Secondary | ICD-10-CM | POA: Diagnosis not present

## 2016-07-31 DIAGNOSIS — D72829 Elevated white blood cell count, unspecified: Secondary | ICD-10-CM | POA: Diagnosis not present

## 2016-07-31 DIAGNOSIS — R748 Abnormal levels of other serum enzymes: Secondary | ICD-10-CM

## 2016-07-31 DIAGNOSIS — R6510 Systemic inflammatory response syndrome (SIRS) of non-infectious origin without acute organ dysfunction: Secondary | ICD-10-CM | POA: Diagnosis not present

## 2016-07-31 LAB — RESPIRATORY PANEL BY PCR
ADENOVIRUS-RVPPCR: NOT DETECTED
Bordetella pertussis: NOT DETECTED
CORONAVIRUS HKU1-RVPPCR: NOT DETECTED
CORONAVIRUS NL63-RVPPCR: NOT DETECTED
CORONAVIRUS OC43-RVPPCR: NOT DETECTED
Chlamydophila pneumoniae: NOT DETECTED
Coronavirus 229E: NOT DETECTED
INFLUENZA A-RVPPCR: NOT DETECTED
Influenza B: NOT DETECTED
Metapneumovirus: NOT DETECTED
Mycoplasma pneumoniae: NOT DETECTED
PARAINFLUENZA VIRUS 1-RVPPCR: NOT DETECTED
PARAINFLUENZA VIRUS 2-RVPPCR: NOT DETECTED
PARAINFLUENZA VIRUS 3-RVPPCR: NOT DETECTED
PARAINFLUENZA VIRUS 4-RVPPCR: NOT DETECTED
RHINOVIRUS / ENTEROVIRUS - RVPPCR: NOT DETECTED
Respiratory Syncytial Virus: NOT DETECTED

## 2016-07-31 LAB — COMPREHENSIVE METABOLIC PANEL
ALK PHOS: 55 U/L (ref 38–126)
ALT: 15 U/L — AB (ref 17–63)
AST: 20 U/L (ref 15–41)
Albumin: 3.3 g/dL — ABNORMAL LOW (ref 3.5–5.0)
Anion gap: 8 (ref 5–15)
BILIRUBIN TOTAL: 1.1 mg/dL (ref 0.3–1.2)
BUN: 20 mg/dL (ref 6–20)
CALCIUM: 8.4 mg/dL — AB (ref 8.9–10.3)
CO2: 25 mmol/L (ref 22–32)
CREATININE: 1.21 mg/dL (ref 0.61–1.24)
Chloride: 104 mmol/L (ref 101–111)
GFR calc Af Amer: >60 mL/min (ref >60)
GFR, EST NON AFRICAN AMERICAN: 56 mL/min — AB (ref >60)
Glucose, Bld: 141 mg/dL — ABNORMAL HIGH (ref 65–99)
Potassium: 3.9 mmol/L (ref 3.5–5.1)
Sodium: 137 mmol/L (ref 135–145)
TOTAL PROTEIN: 5.8 g/dL — AB (ref 6.5–8.1)

## 2016-07-31 LAB — GLUCOSE, CAPILLARY
Glucose-Capillary: 152 mg/dL — ABNORMAL HIGH (ref 65–99)
Glucose-Capillary: 169 mg/dL — ABNORMAL HIGH (ref 65–99)

## 2016-07-31 LAB — CBC
HCT: 35.7 % — ABNORMAL LOW (ref 39.0–52.0)
HEMOGLOBIN: 11.8 g/dL — AB (ref 13.0–17.0)
MCH: 29.5 pg (ref 26.0–34.0)
MCHC: 33.1 g/dL (ref 30.0–36.0)
MCV: 89.3 fL (ref 78.0–100.0)
PLATELETS: 186 10*3/uL (ref 150–400)
RBC: 4 MIL/uL — AB (ref 4.22–5.81)
RDW: 13.2 % (ref 11.5–15.5)
WBC: 21.1 10*3/uL — AB (ref 4.0–10.5)

## 2016-07-31 LAB — TROPONIN I
TROPONIN I: 0.07 ng/mL — AB (ref <0.03)
Troponin I: <0.03 ng/mL (ref <0.03)

## 2016-07-31 LAB — TSH: TSH: 0.509 u[IU]/mL (ref 0.350–4.500)

## 2016-07-31 LAB — MAGNESIUM: MAGNESIUM: 1.4 mg/dL — AB (ref 1.7–2.4)

## 2016-07-31 LAB — PHOSPHORUS: Phosphorus: 2.4 mg/dL — ABNORMAL LOW (ref 2.5–4.6)

## 2016-07-31 LAB — LACTIC ACID, PLASMA
LACTIC ACID, VENOUS: 2.1 mmol/L — AB (ref 0.5–1.9)
Lactic Acid, Venous: 1.3 mmol/L (ref 0.5–1.9)

## 2016-07-31 MED ORDER — ACETAMINOPHEN 325 MG PO TABS: 325.0000 mg | ORAL_TABLET | Freq: Four times a day (QID) | ORAL | Status: AC | PRN

## 2016-07-31 MED ORDER — MAGNESIUM SULFATE 2 GM/50ML IV SOLN
2.0000 g | Freq: Once | INTRAVENOUS | Status: AC
  Administered 2016-07-31: 2 g via INTRAVENOUS
  Filled 2016-07-31: qty 50

## 2016-07-31 MED ORDER — SODIUM CHLORIDE 0.9 % IV BOLUS (SEPSIS)
500.0000 mL | Freq: Once | INTRAVENOUS | Status: AC
  Administered 2016-07-31: 500 mL via INTRAVENOUS

## 2016-07-31 NOTE — Progress Notes (Signed)
CRITICAL VALUE STICKER  CRITICAL VALUE: Troponin 0.07  RECEIVER Doneen Poisson RN  DATE & TIME NOTIFIED: 10:03   Mathis Dad  MD NOTIFIED: [place]  TIME OF NOTIFICATION: 10:12  RESPONSE:

## 2016-07-31 NOTE — Discharge Summary (Signed)
Physician Discharge Summary  Jason [name] NKN:[id number] DOB: 02/01/1941  PCP: Vincente Poli, PA  Admit date: 07/30/2016 Discharge date: 07/31/2016  Recommendations for Outpatient Follow-up:  1. Vincente Poli, PA/PCP in 4 days with repeat labs (CBC with Diff, BMP, Mg & Phos). Please follow final blood culture results that were sent from the hospital. 2. Angelena Form, PA-C/Cardiology on 08/12/2016 at 9:30 am.  Home Health: None Equipment/Devices: None    Discharge Condition: Improved and stable  CODE STATUS: Full  Diet recommendation: Heart Healthy & Diabetic diet.  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 He had gone to an Urgent Care and CXR and Flu swab were reportedly negative. Upon repeated questioning, he specifically denied headache, sore throat, ear ache, cough (although had reported to admitting MD), dyspnea, chest pain, abdominal pain, constipation, diarrhea, urinary frequency, dysuria, skin rash, sick contacts or insect bites. He had been seen at his PCP's office couple days prior to this for routine check up and was told to have an elevated WBC. However, subsequent repeat testing was apparently normal. In the ED, he met SIRS criteria> RR 27, reported fever of 101, WBC 25.2, Lactic acid 2.69>4.15, other vitals stable. UA and CXR did not reveal infection source. Etiology of his presentation was felt to be non specific viral in nature. He was empirically started on Vancomycin & Zosyn.  Lactate normalized. Pro calcitonin 0.37. RSV panel negative. Blood cultures: Neg to date. Admitted overnight for observation. Interviewed and examined in presence of his son-in-law. He stated that all his symptoms had resolved and he was insistent on going home so he could assist his wife with her PD. She had missed her PD night before while he was hospitalized. Discussed case with ID MD on call and we agreed that he did not need any antibiotic at DC. He was advised to seek immediate medical attention for return of symptoms and he verbalized understanding. Etiology of his neutrophilic Leukocytosis was not clear and advised to follow up with PCP early next week with repeat labs and may consider further work up as needed.   His 3rd troponin was mildly elevated and given his complex Cardiac history, Cardiology was consulted and discussed with them. Them opined that he did not have an acute coronary event, recommended holding ACEI & Lasix under BP improves further, but continue Carvedilol and arranged OP Cardiology follow up. Continued prior meds for his afib.  Anemia: ? Dilutional. Follow CBC in a few days.  AKI: holding ACEI and Lasix. Improved. Follow BMP in few days.   Consultations:  Cardiology  Procedures:  None   Discharge Instructions  Discharge Instructions    (HEART FAILURE PATIENTS) Call MD:  Anytime you have any of the following symptoms: 1) 3 pound weight gain in 24 hours or 5 pounds in 1 week 2) shortness of breath, with or without a dry hacking cough 3) swelling in the hands, feet or stomach 4) if you have to sleep on extra pillows at night in order to  breathe.    Complete by:  As directed    Call MD for:  difficulty breathing, headache or visual disturbances    Complete by:  As directed    Call MD for:  extreme fatigue    Complete by:  As directed    Call MD for:  persistant dizziness or light-headedness    Complete by:  As directed    Call MD for:  persistant nausea and  vomiting    Complete by:  As directed    Call MD for:  severe uncontrolled pain    Complete by:  As directed    Call MD for:  temperature >100.4    Complete by:  As directed    Diet - low sodium heart healthy    Complete by:  As directed    Diet Carb Modified    Complete by:  As directed    Increase activity slowly    Complete by:  As directed        Medication List    STOP taking these medications   furosemide 40 MG tablet Commonly known as:  LASIX   lisinopril 5 MG tablet Commonly known as:  PRINIVIL,ZESTRIL     TAKE these medications   acetaminophen 325 MG tablet Commonly known as:  TYLENOL Take 1-2 tablets (325-650 mg total) by mouth every 6 (six) hours as needed for mild pain, moderate pain, fever or headache. What changed:  reasons to take this   amiodarone 200 MG tablet Commonly known as:  PACERONE Take 0.5 tablets (100 mg total) by mouth daily.   apixaban 5 MG Tabs tablet Commonly known as:  ELIQUIS Take 1 tablet (5 mg total) by mouth 2 (two) times daily.   atorvastatin 40 MG tablet Commonly known as:  LIPITOR Take 1 tablet (40 mg total) by mouth daily at 6 PM.   carvedilol 6.25 MG tablet Commonly known as:  COREG Take 1 tablet (6.25 mg total) by mouth 2 (two) times daily.   CENTRUM SILVER 50+MEN Tabs Take 1 tablet by mouth daily.   cetirizine 10 MG tablet Commonly known as:  ZYRTEC Take 10 mg by mouth daily as needed for allergies.   metFORMIN 1000 MG tablet Commonly known as:  GLUCOPHAGE Take 1,000 mg by mouth 2 (two) times daily.      Follow-up Information    Vincente Poli, [place]. Schedule an appointment as soon as possible for a visit in 4 day(s).   Specialty:  Physician Assistant Why:  To be seen with repeat labs (CBC with Diff & BMP). Contact information: [place] [address] [place], [place]. Schedule an appointment as soon as possible for a visit.   Specialty:  Cardiology Contact  information: [address] [place] [address] [phone]          No Known Allergies    Procedures/Studies: Dg Chest 2 View  Result Date: 07/30/2016 CLINICAL DATA:  Nausea and chest congestion over the last few days. EXAM: CHEST  2 VIEW COMPARISON:  07/29/2015 FINDINGS: Heart size is normal. There is aortic atherosclerosis. The lungs are clear. No infiltrate, collapse or effusion. Ordinary degenerative changes affect the spine. IMPRESSION: No active cardiopulmonary disease.  Aortic atherosclerosis. Electronically Signed   By: Nelson Chimes M.D.   On: 07/30/2016 16:48      Subjective: Denies any complaints and wants to go home. States that he is back to his  baseline.  Discharge Exam:  Vitals:   07/30/16 2000 07/30/16 2046 07/31/16 0607 07/31/16 1348  BP: (!) 118/58 133/69 (!) 103/44 116/67  Pulse: 68 69 (!) 58 (!) 55  Resp: (!) _0 Temp:  99 F (37.2 C) 98.8 F (37.1 C) 97.6 F (36.4 C)  TempSrc:      SpO2: 95% 97% 99% 98%  Weight:      Height:        General: Pleasant elderly male seen ambulating comfortably in his room. Did not look septic or toxic. Cardiovascular: S1 & S2 heard, RRR, S1/S2 +. No murmurs, rubs, gallops or clicks. No JVD or pedal edema. Tele: SR. Respiratory: Clear to auscultation without wheezing, rhonchi or crackles. No increased work of breathing. Abdominal:  Non distended, non tender & soft. No organomegaly or masses appreciated. Normal bowel sounds heard. CNS: Alert and oriented. No focal deficits. Extremities: no edema, no cyanosis    The results of significant diagnostics from this hospitalization (including imaging, microbiology, ancillary and laboratory) are listed below for reference.     Microbiology: Recent Results (from the past 240 hour(s))  Blood Culture (routine x 2)     Status: None (Preliminary result)   Collection Time: 07/30/16  7:12 PM  Result Value Ref Range Status   Specimen Description  BLOOD RIGHT ARM  Final   Special Requests   Final    BOTTLES DRAWN AEROBIC AND ANAEROBIC Blood Culture adequate volume   Culture NO GROWTH < 24 HOURS  Final   Report Status PENDING  Incomplete  Respiratory Panel by PCR     Status: None   Collection Time: 07/30/16  7:18 PM  Result Value Ref Range Status   Adenovirus NOT DETECTED NOT DETECTED Final   Coronavirus 229E NOT DETECTED NOT DETECTED Final   Coronavirus HKU1 NOT DETECTED NOT DETECTED Final   Coronavirus NL63 NOT DETECTED NOT DETECTED Final   Coronavirus OC43 NOT DETECTED NOT DETECTED Final   Metapneumovirus NOT DETECTED NOT DETECTED Final   Rhinovirus / Enterovirus NOT DETECTED NOT DETECTED Final   Influenza A NOT DETECTED NOT DETECTED Final   Influenza B NOT DETECTED NOT DETECTED Final   Parainfluenza Virus 1 NOT DETECTED NOT DETECTED Final   Parainfluenza Virus 2 NOT DETECTED NOT DETECTED Final   Parainfluenza Virus 3 NOT DETECTED NOT DETECTED Final   Parainfluenza Virus 4 NOT DETECTED NOT DETECTED Final   Respiratory Syncytial Virus NOT DETECTED NOT DETECTED Final   Bordetella pertussis NOT DETECTED NOT DETECTED Final   Chlamydophila pneumoniae NOT DETECTED NOT DETECTED Final   Mycoplasma pneumoniae NOT DETECTED NOT DETECTED Final  Blood Culture (routine x 2)     Status: None (Preliminary result)   Collection Time: 07/30/16  7:19 PM  Result Value Ref Range Status   Specimen Description BLOOD LEFT HAND  Final   Special Requests   Final    BOTTLES DRAWN AEROBIC AND ANAEROBIC Blood Culture adequate volume   Culture NO GROWTH < 24 HOURS  Final   Report Status PENDING  Incomplete     Labs: CBC:  Recent Labs Lab 07/30/16 1424 07/30/16 2051 07/31/16 0256  WBC 25.2* 23.1* 21.1*  NEUTROABS  --  18.9*  --   HGB 14.5 13.0 11.8*  HCT 42.4 38.8* 35.7*  MCV 88.9 89.2 89.3  PLT 248 204 937   Basic Metabolic Panel:  Recent Labs Lab 07/30/16 1424 07/31/16 0256  NA 136 137  K 4.7 3.9  CL 98* 104  CO2 25 25  GLUCOSE  180* 141*  BUN 25* 20  CREATININE 1.34* 1.21  CALCIUM 9.9 8.4*  MG  --  1.4*  PHOS  --  2.4*   Liver Function Tests:  Recent Labs Lab 07/30/16 1534 07/31/16 0256  AST 33 20  ALT 20 15*  ALKPHOS 72 55  BILITOT 1.0 1.1  PROT 7.3 5.8*  ALBUMIN 4.4 3.3*   Cardiac Enzymes:  Recent Labs Lab 07/30/16 2051 07/31/16 0256 07/31/16 0848  TROPONINI <0.03 <0.03 0.07*   CBG:  Recent Labs Lab 07/30/16 2049 07/31/16 0802 07/31/16 1133  GLUCAP 153* 152* 169*   Thyroid function studies  Recent Labs  07/31/16 0015  TSH 0.509   Urinalysis    Component Value Date/Time   COLORURINE AMBER (A) 07/30/2016 1552   APPEARANCEUR CLOUDY (A) 07/30/2016 1552   LABSPEC 1.016 07/30/2016 1552   PHURINE 5.0 07/30/2016 1552   GLUCOSEU NEGATIVE 07/30/2016 1552   HGBUR NEGATIVE 07/30/2016 1552   BILIRUBINUR NEGATIVE 07/30/2016 1552   KETONESUR NEGATIVE 07/30/2016 1552   PROTEINUR NEGATIVE 07/30/2016 1552   NITRITE NEGATIVE 07/30/2016 1552   LEUKOCYTESUR NEGATIVE 07/30/2016 1552      Time coordinating discharge: Over 30 minutes  SIGNED:  Vernell Leep, MD, FACP, FHM. [facility] Pager [phone]  If 7PM-7AM, please contact night-coverage [email or web address] Password [email or web address] 07/31/2016, 2:15 PM

## 2016-07-31 NOTE — Consult Note (Signed)
CARDIOLOGY CONSULT NOTE   Patient ID: Jason [name] MRN: [id number] DOB/AGE: 77-30-1942 77 y.o.  Admit date: 07/30/2016  Requesting Physician: Dr. Waymon Amato Primary Physician:   Helayne [place], PA Primary Cardiologist:   Dr. Mendel Ryder Reason for Consultation:   Elevated Troponin  HPI: Jason [name] is a 76 y.o. male with a history of acute on chronic CHF,  PAF of unknown duration- DCCV 08/2015 by Dr. Jens Som-- on Eliquis, hypertension, HLP, diabetes, CAD, who is being seen today in consultation at the request of Dr. Waymon Amato for elevated Troponin.  Prior cardiac work-up (07/29/2015) performed in the setting of new diagnoses of atrial fibrillation. An echocardiogram was done revealing chronic systolic heart failure ( EF 20-25 %) which was felt to be tachycardia induced from his atrial fibrillation. The chest CT to r/o PE showed coronary calcifications triggering a nuclear stress test on 09/30/2016, two defects concerning for ischemia were observed therefore a cardiac catheterization was done on 11/16/2015 for further evaluation; moderate coronary atherosclerosis with 60-70% calcified eccentric mid LAD stenosis-- 75% stenosis in the midportion of a small first obtuse marginal-- and 30% mid RCA. Normal left ventricular function. Elevated left ventricular end-diastolic pressure compatible with diastolic dysfunction. EF 55-60%. Apical ischemia felt secondary to borderline significant mid LAD; Dr. Katrinka Blazing felt that in the absence of symptoms (CP)  medical therapy was the most prudent approach to his abnormal cath; no PCI performed, LV function had improved, beta blocker was increased Coreg 6.25 mg BID, Lipitor 40 mg, on Lisinopril.  Dr. Lonn Georgia notes that his goal is to eventually discontinue the amiodarone if no recurrences of atrial fib take place. Long-term therapy with Eliquis may be necessary but will depend on if he has recurrence of afib.   [name] presented to [facility] for this admission  on 07/30/2016 for weakness and fatigue, nausea, dry heaves, non productive cough, and fever. No chest pain. He was given fluids in the ER and began to feel better.   Initial EKG showed sinus rhythm with incomplete RBBB  (no change from previous) HR 89. Initial labs: WBC 25.2, Hbg 14.5, and platelets 248.  Na+ 136, K + 4.7, creatinine 1.34 (baseline ~1.15), bnp 360.6. Initial Troponin is 0.00 with repeat values of  0.03--> 0.04 --> 0.03 --> 0.03 --> 0.07.  Chest xray shows no acute changes. Repeat lactic acid resulted as 4.15 and therefore code sepsis was called, he was started on empiric antibiotics, protocol fluids ordered and he was admitted to the medicine service.  Today: Patient continues to deny any chest pain, SOB or exertional angina. Feeling better. Of note, he plans to leave hospital today whether he is discharged or not, but will follow-up with PCP or Dr. Katrinka Blazing if its recommended in 1 week. Refuses any further testing.  Current BP: 103/44, Temp 98.8, Pulse 58, Resp 18, O2 99% on RA     Past Medical History:  Diagnosis Date  . Atrial fibrillation (HCC)   . Diabetes mellitus without complication (HCC)   . Hypertension      Past Surgical History:  Procedure Laterality Date  . CARDIAC CATHETERIZATION N/A 11/16/2015   Procedure: Left Heart Cath and Coronary Angiography;  Surgeon: Jason Records, MD;  Location: [facility] INVASIVE CV LAB;  Service: Cardiovascular;  Laterality: N/A;  . CARDIOVERSION N/A 08/28/2015   Procedure: CARDIOVERSION;  Surgeon: Jason Bunting, MD;  Location: [facility] ENDOSCOPY;  Service: Cardiovascular;  Laterality: N/A;  . HERNIA REPAIR    .  KIDNEY STONE SURGERY      No Known Allergies  I have reviewed the patient's current medications . amiodarone  100 mg Oral Daily  . apixaban  5 mg Oral BID  . atorvastatin  40 mg Oral q1800  . carvedilol  6.25 mg Oral BID  . guaiFENesin  600 mg Oral BID  . loratadine  10 mg Oral Daily  . sodium chloride flush  3 mL Intravenous Q12H    . sodium chloride Stopped (07/30/16 1903)   acetaminophen **OR** acetaminophen, albuterol, HYDROcodone-acetaminophen, ondansetron **OR** ondansetron (ZOFRAN) IV  Prior to Admission medications   Medication Sig Start Date End Date Taking? Authorizing Provider  acetaminophen (TYLENOL) 325 MG tablet Take 325-650 mg by mouth every 6 (six) hours as needed for mild pain.    Yes Historical Provider, MD  amiodarone (PACERONE) 200 MG tablet Take 0.5 tablets (100 mg total) by mouth daily. 07/23/16  Yes Jason Records, MD  apixaban (ELIQUIS) 5 MG TABS tablet Take 1 tablet (5 mg total) by mouth 2 (two) times daily. 10/30/15  Yes Jason Records, MD  atorvastatin (LIPITOR) 40 MG tablet Take 1 tablet (40 mg total) by mouth daily at 6 PM. 09/26/15  Yes Jason Records, MD  carvedilol (COREG) 6.25 MG tablet Take 1 tablet (6.25 mg total) by mouth 2 (two) times daily. 09/20/15  Yes Brittainy Sherlynn Carbon, PA-C  cetirizine (ZYRTEC) 10 MG tablet Take 10 mg by mouth daily as needed for allergies.   Yes Historical Provider, MD  furosemide (LASIX) 40 MG tablet Take 1 tablet (40 mg total) by mouth daily. 12/24/15  Yes Jason Records, MD  lisinopril (PRINIVIL,ZESTRIL) 5 MG tablet TAKE ONE TABLET BY MOUTH ONCE DAILY 05/20/16  Yes Dyann Kief, PA-C  metFORMIN (GLUCOPHAGE) 1000 MG tablet Take 1,000 mg by mouth 2 (two) times daily.   Yes Historical Provider, MD  Multiple Vitamins-Minerals (CENTRUM SILVER 50+MEN) TABS Take 1 tablet by mouth daily.   Yes Historical Provider, MD     Social History   Social History  . Marital status: Married    Spouse name: N/A  . Number of children: N/A  . Years of education: N/A   Occupational History  . Not on file.   Social History Main Topics  . Smoking status: Never Smoker  . Smokeless tobacco: Never Used  . Alcohol use No  . Drug use: No  . Sexual activity: No   Other Topics Concern  . Not on file   Social History Narrative  . No narrative on file    Family Status  Relation  Status  . Mother Deceased  . Father Deceased  . Maternal Grandmother Deceased  . Maternal Grandfather Deceased  . Paternal Grandmother Deceased  . Paternal Grandfather Deceased  . Neg Hx    Family History  Problem Relation Age of Onset  . Heart attack Neg Hx   . Hypertension Neg Hx         ROS:  Full 14 point review of systems complete and found to be negative unless listed above.  Physical Exam: Blood pressure (!) 103/44, pulse (!) 58, temperature 98.8 F (37.1 C), resp. rate 18, height  (1.803 m), weight 213 lb 9.6 oz (96.9 kg), SpO2 99 %.  General: Well developed, well nourished, male in no acute distress Head: No xanthomas. Normocephalic and atraumatic, Lungs: normal effort and rate of breathing. Heart:: normal rate and rhythm no JVD Neck: No carotid bruits. No lymphadenopathy. Abdomen: Bowel sounds  present, abdomen soft and non-tender Msk:  Spontaneously moves all 4 extremities Extremities: No clubbing or cyanosis.  No le edema  Neuro: Alert and oriented X 3. No focal deficits noted. Psych:  Good affect, responds appropriately Skin: No rashes or lesions noted.     Labs:  Lab Results  Component Value Date   WBC 21.1 (H) 07/31/2016   HGB 11.8 (L) 07/31/2016   HCT 35.7 (L) 07/31/2016   MCV 89.3 07/31/2016   PLT 186 07/31/2016   No results for input(s): INR in the last 72 hours.  Recent Labs Lab 07/31/16 0256  NA 137  K 3.9  CL 104  CO2 25  BUN 20  CREATININE 1.21  CALCIUM 8.4*  PROT 5.8*  BILITOT 1.1  ALKPHOS 55  ALT 15*  AST 20  GLUCOSE 141*  ALBUMIN 3.3*   Magnesium  Date Value Ref Range Status  07/31/2016 1.4 (L) 1.7 - 2.4 mg/dL Final    Recent Labs  91/47/82 2051 07/31/16 0256 07/31/16 0848  TROPONINI <0.03 <0.03 0.07*    Recent Labs  07/30/16 1603  TROPIPOC 0.00    TSH  Date/Time Value Ref Range Status  07/31/2016 12:15 AM 0.509 0.350 - 4.500 uIU/mL Final    Comment:    Performed by a 3rd Generation assay with a  functional sensitivity of <=0.01 uIU/mL.  07/23/2016 04:06 PM 3.250 0.450 - 4.500 uIU/mL Final     Echo   Transthoracic Echocardiography 07/31/2015 Study Conclusions  - Left ventricle: The cavity size was normal. There was moderate   concentric hypertrophy. Systolic function was severely reduced.   The estimated ejection fraction was in the range of 20% to 25%.   Diffuse hypokinesis. - Aortic valve: There was trivial regurgitation. - Mitral valve: Calcified annulus. - Left atrium: The atrium was moderately dilated. - Right ventricle: The cavity size was mildly dilated. Wall   thickness was normal. - Right atrium: The atrium was moderately dilated. - Tricuspid valve: There was trivial regurgitation. - Pulmonary arteries: Systolic pressure was mildly increased. PA   peak pressure: 39 mm Hg (S).  Left Heart Cath and Coronary Angiography 11/16/2015 Prox LAD lesion, 65 %stenosed. Mid RCA lesion, 30 %stenosed. 1st Mrg lesion, 75 %stenosed. The left ventricular systolic function is normal. LV end diastolic pressure is mildly elevated. The left ventricular ejection fraction is 55-65% by visual estimate.   Noncritical coronary atherosclerosis with 60-70% calcified eccentric mid LAD stenosis. 75% stenosis in the midportion of a small first obtuse marginal, and 30% mid RCA. Normal left ventricular function. Elevated left ventricular end-diastolic pressure compatible with diastolic dysfunction. EF 55-60%. Apical ischemia felt secondary to borderline significant mid LAD. In absence of symptoms intensification of medical therapy seems most prudent approach.  RECOMMENDATIONS: With improved LV function, further intensification of beta blocker therapy would be helpful for ischemic heart disease noted above. ACE inhibitor therapy is not mandatory. Antiplatelet therapy. Goal will be eventual discontinuation of amiodarone if no recurrences of atrial fib. Long-term anticoagulation therapy with  Eliquis may probably be necessary but will depend upon course and whether there is recurrent atrial fibrillation. Aggressive risk factor modification.   ECG:     HR 89, sinus rhythm, incomplete RBBB. - independently reviewed   Radiology:Dg Chest 2 View  Result Date: 07/30/2016 CLINICAL DATA:  Nausea and chest congestion over the last few days. EXAM: CHEST  2 VIEW COMPARISON:  07/29/2015 FINDINGS: Heart size is normal. There is aortic atherosclerosis. The lungs are clear. No infiltrate, collapse or  effusion. Ordinary degenerative changes affect the spine. IMPRESSION: No active cardiopulmonary disease.  Aortic atherosclerosis. Electronically Signed   By: Paulina Fusi M.D.   On: 07/30/2016 16:48        ASSESSMENT AND PLAN:      Active Problems:   T2DM (type 2 diabetes mellitus) (HCC)   HTN (hypertension)   Paroxysmal atrial fibrillation (HCC)   Chronic diastolic heart failure (HCC)   Leukocytosis   SIRS (systemic inflammatory response syndrome) (HCC)   1. Elevated Troponin with known CAD:  Mildly elevated troponin with known CAD in the setting of acute sepsis without chest pain. Patient had a cardiac cath done 11/2015 resulting with moderate coronary atherosclerotic disease. Current EKG without signs of ischemia in sinus rhythm with incomplete RBBB  (no change from previous) HR 89.  --  Unless patient develops symptoms of angina,  ischemic work-up is not indicated at this time. His elevated troponin is likely related to demand ischemia, recommend continuing treatment for known CAD with statin and aspirin.  2. Paroxysmal atrial fibrillation (HCC): Currently in sinus rhythm.  CHA2DS2 vas score 6 : continue current anticoagulation with  Eliquis. Currently rate controlled with coreg and rhythm controlled with amiodarone, continue these medications.      3. HTN (hypertension) - Currently BP is  103/44, hold lisinopril and lasix.   4. Chronic diastolic heart failure (HCC) - Admission  weight is 213, previous hospital discharge weight 210 -- strict I/O's -- daily weights -- chronic systolic heart failure ( EF 20-25 %; 07/2015) which was felt to be tachycardia induced from his atrial fibrillation, EF has since improved.  --  Currently holding lasix  SIRS (systemic inflammatory response syndrome) (HCC) - Internal Medicine to manage  Diabetes mellitus - Internal medicine to manage   Signed: Dorthula Matas, PA-C 07/31/2016 11:37 AM   I have seen and examined the patient along with GREENE,TIFFANY G, PA-C.  I have reviewed the chart, notes and new data.  I agree with PA's note.  Key new complaints: nevere had angina, feeling better, excellent appetite, still some fatigue Key examination changes: possibly a few left base lung dry crackles, no edema, no JVD, normal rhythm Key new findings / data: minimal abnormality in troponin, no acute ECG changes  PLAN: This is not an acute coronary insufficiency event. Hold ACEi and diuretic for a few days until BP recovers. Continue carvedilol. Weigh daily and call office if weight exceeds 215 lb on home scale (he is usually 210-212). Follow up w Dr. Katrinka Blazing in 10-14 days to restart ACEi. Since EF has recovered so well, he may do fine without diuretic at this point.  Thurmon Fair, MD, [facility] [phone] 07/31/2016, 2:12 PM

## 2016-07-31 NOTE — Progress Notes (Signed)
[name] to be D/C'd home per MD order.  Discussed with the patient and all questions fully answered.  VSS, Skin clean, dry and intact without evidence of skin break down, no evidence of skin tears noted. IV catheter discontinued intact. Site without signs and symptoms of complications. Dressing and pressure applied.  An After Visit Summary was printed and given to the patient. Patient received prescription.  D/c education completed with patient/family including follow up instructions, medication list, d/c activities limitations if indicated, with other d/c instructions as indicated by MD - patient able to verbalize understanding, all questions fully answered.   Patient instructed to return to ED, call 911, or call MD for any changes in condition.   Patient escorted via WC, and D/C home via private auto.  Evern Bio 07/31/2016 4:56 PM

## 2016-07-31 NOTE — Progress Notes (Signed)
Jason [name] arrived to the unit via bed from the emergency department. Patient is alert and oriented x4. Patient is ambulatory.  No complaints of pain. Vital signs stable.  IV intact to the right forearm.  No skin issues.  Educated the patient on how to reach the staff on the unit. Lowered the patient bed and placed the call light within reach.  Will continue to monitor the patient.

## 2016-07-31 NOTE — Discharge Instructions (Signed)

## 2016-07-31 NOTE — Progress Notes (Signed)
CRITICAL VALUE ALERT  Critical value received:  Lactic Acid 2.1  Date of notification:  07/31/16  Time of notification:  0135  Critical value read back:Yes.    Nurse who received alert:  Chancy Milroy   MD notified (1st page):  K schorr   Time of first page:  0136  MD notified (2nd page):  Time of second page:  Responding MD:  Merdis Delay  Time MD responded:  [phone]

## 2016-08-01 LAB — URINE CULTURE

## 2016-08-04 LAB — CULTURE, BLOOD (ROUTINE X 2)
Culture: NO GROWTH
Culture: NO GROWTH
SPECIAL REQUESTS: ADEQUATE
Special Requests: ADEQUATE

## 2016-08-05 DIAGNOSIS — R7989 Other specified abnormal findings of blood chemistry: Secondary | ICD-10-CM | POA: Diagnosis not present

## 2016-08-08 ENCOUNTER — Encounter: Payer: Self-pay | Admitting: Physician Assistant

## 2016-08-11 NOTE — Progress Notes (Signed)
Cardiology Office Note    Date:  08/12/2016   ID:  Jason [name], DOB 02-09-1941, MRN [id number]  PCP:  Vincente Poli, PA  Cardiologist:  Dr. Tamala Julian   CC: post hospital follow up   History of Present Illness:  Jason [name] is a 76 y.o. male with a history of chronic CHF,  PAF s/p DCCV 08/2015 on Eliquis, hypertension, HLD, diabetes, and CAD who presents to clinic for post hospital follow up.  Prior cardiac work-up (07/29/2015) performed in the setting of new diagnoses of atrial fibrillation. An echocardiogram was done revealing chronic systolic heart failure ( EF 20-25 %) which was felt to be tachycardia induced from his atrial fibrillation. The chest CT to r/o PE showed coronary calcifications triggering a nuclear stress test on 09/30/2016, two defects concerning for ischemia were observed therefore a cardiac catheterization was done on 11/16/2015 for further evaluation. There was moderate coronary atherosclerosis with 60-70% calcified eccentric mid LAD stenosis-- 75% stenosis in the midportion of a small first obtuse marginal-- and 30% mid RCA. Normal left ventricular function. Elevated left ventricular end-diastolic pressure compatible with diastolic dysfunction. EF 55-60%. Apical ischemia felt secondary to borderline significant mid LAD; Dr. Tamala Julian felt that in the absence of symptoms (CP)  medical therapy was the most prudent approach to his abnormal cath; no PCI performed, LV function had improved, beta blocker was increased Coreg 6.25 mg BID, Lipitor 40 mg, on Lisinopril.  Dr. Darliss Ridgel notes that his goal is to eventually discontinue the amiodarone if no recurrences of atrial fib take place. Long-term therapy with Eliquis may be necessary but will depend on if he has recurrence of afib.   Recently admitted 4/18-4/19/18 for generalized weakness. In the ED, he met SIRS criteria> RR 27, reported fever of 101, WBC 25.2, Lactic acid 2.69>4.15, other vitals stable. UA and CXR did not reveal infection  source. Etiology of his presentation was felt to be non specific viral in nature. He was empirically started on Vancomycin & Zosyn. Lactate normalized. Pro calcitonin 0.37. RSV panel negative. Blood cultures: Neg. Admitted overnight for observation but insisted on leaving the next day because he needed to get home to give his wife peritoneal dialysis. Lasix 67m daily and lisinopril 566mwere held given soft BPs.   Today he presents to clinic for follow up. He gained 5 lbs overnight and was gaining fluid so he restarted lasix. He also restarted lisinopril. She has been feeling great now. No CP or SOB. No LE edema, orthopnea or PND. No dizziness or syncope. No blood in stool or urine. No palpitations. He has had a lot of stress with being a caregiver as his wife is very sick and on peritoneal dialysis. He saw PCP a couple days ago and repeat CBC showed white count down to 11K,    Past Medical History:  Diagnosis Date  . Atrial fibrillation ([facility]  . Diabetes mellitus without complication ([facility]  . Hypertension     Past Surgical History:  Procedure Laterality Date  . CARDIAC CATHETERIZATION N/A 11/16/2015   Procedure: Left Heart Cath and Coronary Angiography;  Surgeon: HeBelva CromeMD;  [facility];  Service: Cardiovascular;  Laterality: N/A;  . CARDIOVERSION N/A 08/28/2015   Procedure: CARDIOVERSION;  Surgeon: BrLelon PerlaMD;  [facility];  Service: Cardiovascular;  Laterality: N/A;  . HERNIA REPAIR    . KIDNEY STONE SURGERY      Current Medications: Outpatient Medications Prior to Visit  Medication Sig Dispense Refill  . acetaminophen (TYLENOL) 325 MG tablet Take 1-2 tablets (325-650 mg total) by mouth every 6 (six) hours as needed for mild pain, moderate pain, fever or headache.    Marland Kitchen amiodarone (PACERONE) 200 MG tablet Take 0.5 tablets (100 mg total) by mouth daily. 45 tablet 2  . apixaban (ELIQUIS) 5 MG TABS tablet Take 1 tablet (5 mg total) by mouth 2  (two) times daily. 180 tablet 3  . atorvastatin (LIPITOR) 40 MG tablet Take 1 tablet (40 mg total) by mouth daily at 6 PM. 30 tablet 11  . carvedilol (COREG) 6.25 MG tablet Take 1 tablet (6.25 mg total) by mouth 2 (two) times daily. 60 tablet 11  . cetirizine (ZYRTEC) 10 MG tablet Take 10 mg by mouth daily as needed for allergies.    . metFORMIN (GLUCOPHAGE) 1000 MG tablet Take 1,000 mg by mouth 2 (two) times daily.    . Multiple Vitamins-Minerals (CENTRUM SILVER 50+MEN) TABS Take 1 tablet by mouth daily.     No facility-administered medications prior to visit.      Allergies:   Patient has no known allergies.   Social History   Social History  . Marital status: Married    Spouse name: N/A  . Number of children: N/A  . Years of education: N/A   Social History Main Topics  . Smoking status: Never Smoker  . Smokeless tobacco: Never Used  . Alcohol use No  . Drug use: No  . Sexual activity: No   Other Topics Concern  . None   Social History Narrative  . None     Family History:  The patient's family history includes Cancer in his father; Heart failure in his mother.      ROS:   Please see the history of present illness.    ROS All other systems reviewed and are negative.   PHYSICAL EXAM:   VS:  BP 110/60   Pulse 70   Ht 5' 11" (1.803 m)   Wt 212 lb 6.4 oz (96.3 kg)   SpO2 97%   BMI 29.62 kg/m    GEN: Well nourished, well developed, in no acute distress  HEENT: normal  Neck: no JVD, carotid bruits, or masses Cardiac: RRR; no murmurs, rubs, or gallops,no edema  Respiratory:  clear to auscultation bilaterally, normal work of breathing GI: soft, nontender, nondistended, + BS MS: no deformity or atrophy  Skin: warm and dry, no rash Neuro:  Alert and Oriented x 3, Strength and sensation are intact Psych: euthymic mood, full affect   Wt Readings from Last 3 Encounters:  08/12/16 212 lb 6.4 oz (96.3 kg)  07/30/16 213 lb 9.6 oz (96.9 kg)  07/23/16 211 lb 12.8 oz  (96.1 kg)      Studies/Labs Reviewed:   EKG:  EKG is ordered today.  The ekg ordered today demonstrates NSR HR 68   Recent Labs: 07/31/2016: ALT 15; BUN 20; Creatinine, Ser 1.21; Hemoglobin 11.8; Magnesium 1.4; Platelets 186; Potassium 3.9; Sodium 137; TSH 0.509   Lipid Panel    Component Value Date/Time   CHOL 130 07/29/2015 1857   TRIG 61 07/29/2015 1857   HDL 30 (L) 07/29/2015 1857   CHOLHDL 4.3 07/29/2015 1857   VLDL 12 07/29/2015 1857   LDLCALC 88 07/29/2015 1857    Additional studies/ records that were reviewed today include:  Transthoracic Echocardiography 07/31/2015 Study Conclusions  - Left ventricle: The cavity size was normal. There was moderate concentric hypertrophy. Systolic function  was severely reduced. The estimated ejection fraction was in the range of 20% to 25%. Diffuse hypokinesis. - Aortic valve: There was trivial regurgitation. - Mitral valve: Calcified annulus. - Left atrium: The atrium was moderately dilated. - Right ventricle: The cavity size was mildly dilated. Wall thickness was normal. - Right atrium: The atrium was moderately dilated. - Tricuspid valve: There was trivial regurgitation. - Pulmonary arteries: Systolic pressure was mildly increased. PA peak pressure: 39 mm Hg (S).  Left Heart Cath and Coronary Angiography 11/16/2015 Prox LAD lesion, 65 %stenosed. Mid RCA lesion, 30 %stenosed. 1st Mrg lesion, 75 %stenosed. The left ventricular systolic function is normal. LV end diastolic pressure is mildly elevated. The left ventricular ejection fraction is 55-65% by visual estimate.  Noncritical coronary atherosclerosis with 60-70% calcified eccentric mid LAD stenosis. 75% stenosis in the midportion of a small first obtuse marginal, and 30% mid RCA. Normal left ventricular function. Elevated left ventricular end-diastolic pressure compatible with diastolic dysfunction. EF 55-60%. Apical ischemia felt secondary to borderline  significant mid LAD. In absence of symptoms intensification of medical therapy seems most prudent approach.  RECOMMENDATIONS: With improved LV function, further intensification of beta blocker therapy would be helpful for ischemic heart disease noted above. ACE inhibitor therapy is not mandatory. Antiplatelet therapy. Goal will be eventual discontinuation of amiodarone if no recurrences of atrial fib. Long-term anticoagulation therapy with Eliquis may probably be necessary but will depend upon course and whether there is recurrent atrial fibrillation. Aggressive risk factor modification.    ASSESSMENT & PLAN:   Chronic diastolic CHF: appears euvolemic today. He is back on lasix 40 mg daily  CAD: no chest pain. Continue statin and BB. No ASA given DOAC use  PAF: he has remained in NSR on amiodarone 139m daily. Continue Eliquis 535mBID for CHADSVASC score of 6 (CHF, HTN, age, DM, vasc dz)  HTN: BP well controlled today   DMT2: continue current regimen   Medication Adjustments/Labs and Tests Ordered: Current medicines are reviewed at length with the patient today.  Concerns regarding medicines are outlined above.  Medication changes, Labs and Tests ordered today are listed in the Patient Instructions below. Patient Instructions  Medication Instructions:  Your physician recommends that you continue on your current medications as directed. Please refer to the Current Medication list given to you today.   Labwork: None ordered  Testing/Procedures: None ordered  Follow-Up: Your physician wants you to follow-up in: 5 [place] receive a reminder letter in the mail two months in advance. If you don't receive a letter, please call our office to schedule the follow-up appointment.   Any Other Special Instructions Will Be Listed Below (If Applicable).     If you need a refill on your cardiac medications before your next appointment, please call your  pharmacy.      Signed, KaAngelena FormPA-C  08/12/2016 10:20 AM    [facility] [address] [place]  [id number]: [phone]: [phone]

## 2016-08-12 ENCOUNTER — Encounter: Payer: Self-pay | Admitting: Physician Assistant

## 2016-08-12 ENCOUNTER — Ambulatory Visit (INDEPENDENT_AMBULATORY_CARE_PROVIDER_SITE_OTHER): Payer: Medicare Other | Admitting: Physician Assistant

## 2016-08-12 ENCOUNTER — Telehealth: Payer: Self-pay

## 2016-08-12 VITALS — BP 110/60 | HR 70 | Ht 71.0 in | Wt 212.4 lb

## 2016-08-12 DIAGNOSIS — Z7901 Long term (current) use of anticoagulants: Secondary | ICD-10-CM

## 2016-08-12 DIAGNOSIS — I5032 Chronic diastolic (congestive) heart failure: Secondary | ICD-10-CM | POA: Diagnosis not present

## 2016-08-12 DIAGNOSIS — Z79899 Other long term (current) drug therapy: Secondary | ICD-10-CM

## 2016-08-12 DIAGNOSIS — I10 Essential (primary) hypertension: Secondary | ICD-10-CM

## 2016-08-12 DIAGNOSIS — I480 Paroxysmal atrial fibrillation: Secondary | ICD-10-CM

## 2016-08-12 MED ORDER — FUROSEMIDE 40 MG PO TABS: 40.0000 mg | ORAL_TABLET | Freq: Every day | ORAL | 1 refills | Status: DC

## 2016-08-12 NOTE — Telephone Encounter (Signed)
**Note De-Identified  Obfuscation** The pt is advised that his Eliquis came in the mail and that he can pick up at his convenience. He states that he will pick up today.

## 2016-08-12 NOTE — Patient Instructions (Signed)
Medication Instructions:  Your physician recommends that you continue on your current medications as directed. Please refer to the Current Medication list given to you today.   Labwork: None ordered  Testing/Procedures: None ordered  Follow-Up: Your physician wants you to follow-up in: 5 MONTHS WITH DR. SMITH.  You will receive a reminder letter in the mail two months in advance. If you don't receive a letter, please call our office to schedule the follow-up appointment.   Any Other Special Instructions Will Be Listed Below (If Applicable).     If you need a refill on your cardiac medications before your next appointment, please call your pharmacy.   

## 2016-09-14 ENCOUNTER — Other Ambulatory Visit: Payer: Self-pay | Admitting: Cardiology

## 2016-09-25 ENCOUNTER — Other Ambulatory Visit: Payer: Self-pay | Admitting: Interventional Cardiology

## 2016-11-02 IMAGING — US US ABDOMEN LIMITED
1 series · 14 of 25 positions shown · non-contrast
Comparison: CT scan of July 29, 2015.

CLINICAL DATA: Hepatic cirrhosis.

EXAM:
US ABDOMEN LIMITED - RIGHT UPPER QUADRANT

[Series 1: us abdomen limited · 0.26mm/px · 14 of 45 slices shown]
[im 1/45]
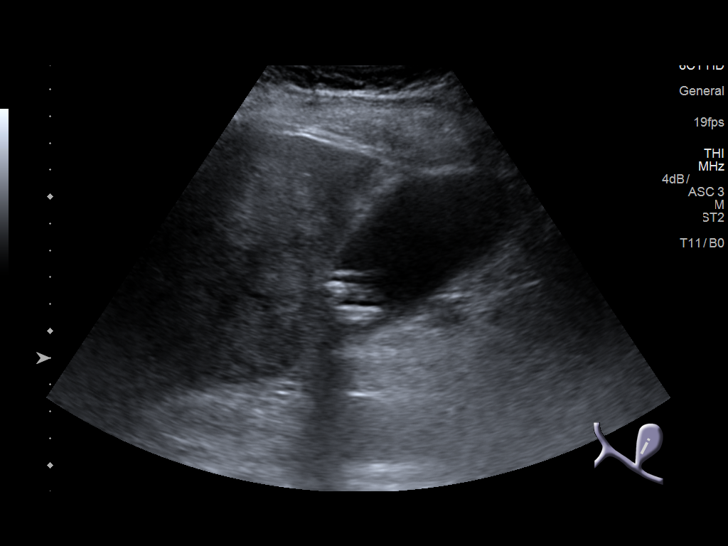
[im 4/45]
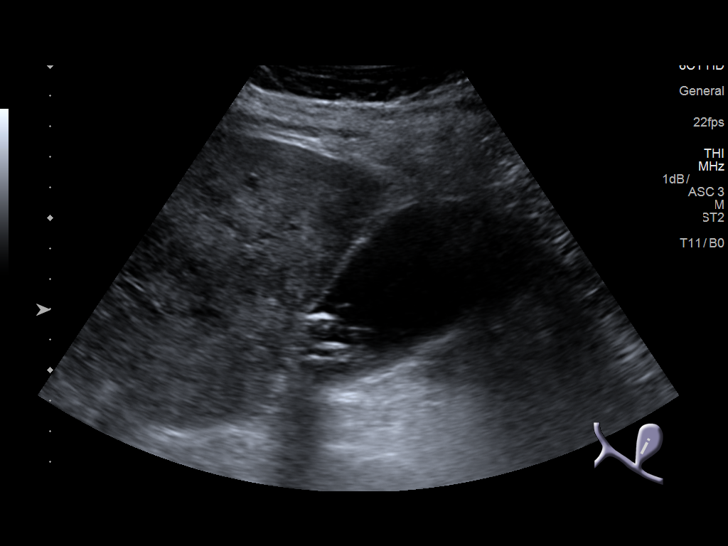
[im 8/45]
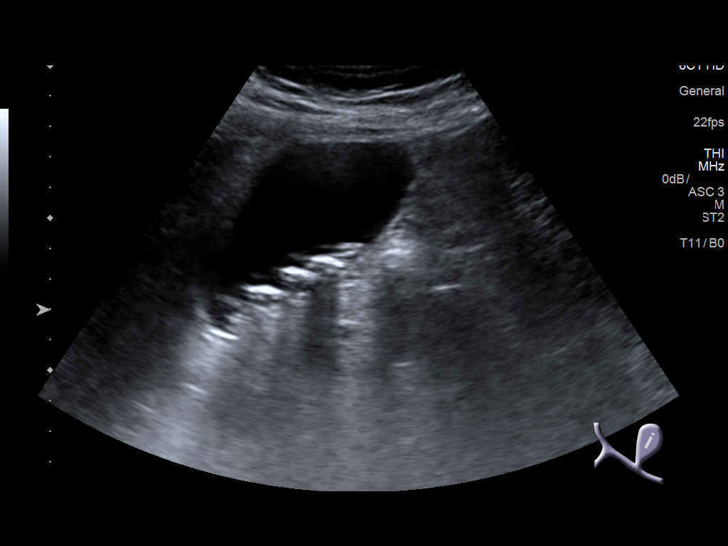
[im 12/45]
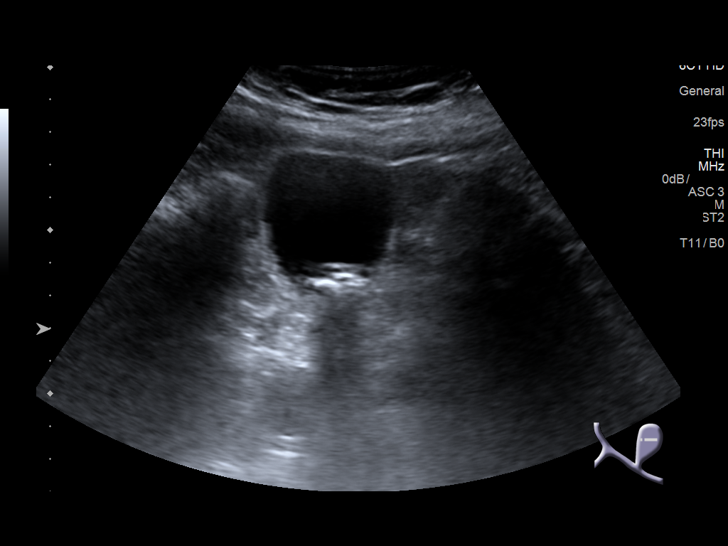
[im 15/45]
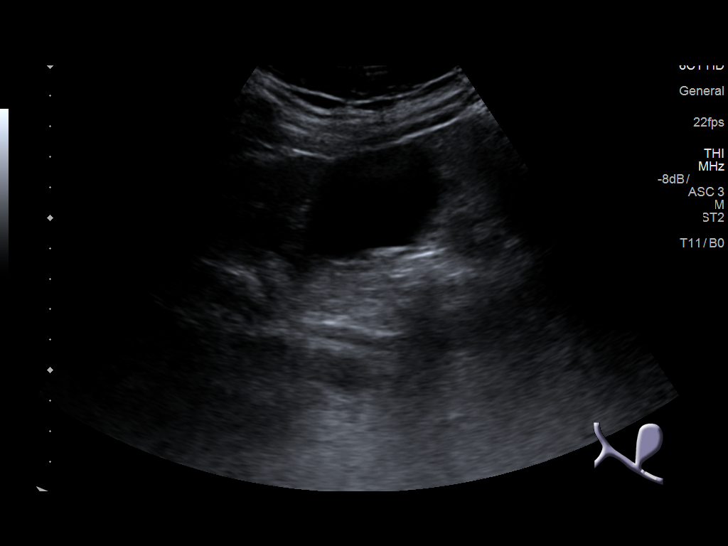
[im 17/45]
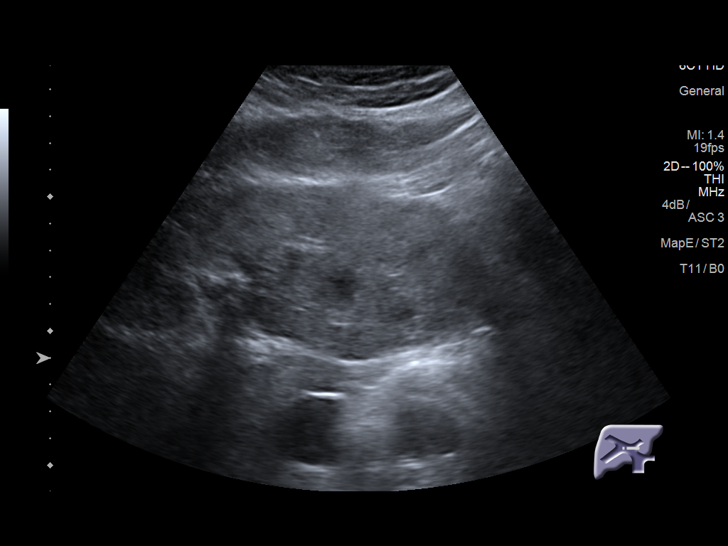
[im 21/45]
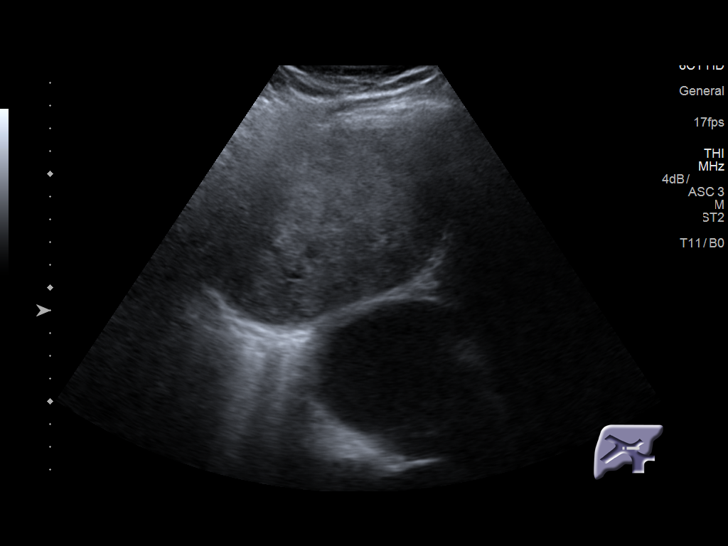
[im 24/45]
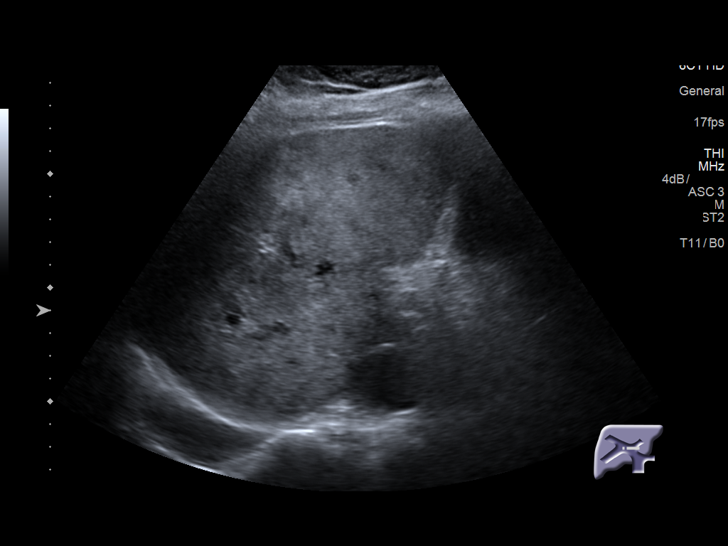
[im 28/45]
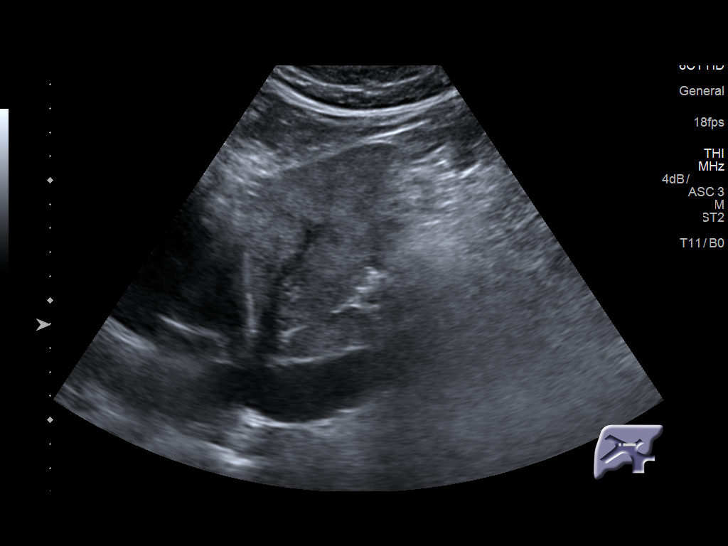
[im 30/45]
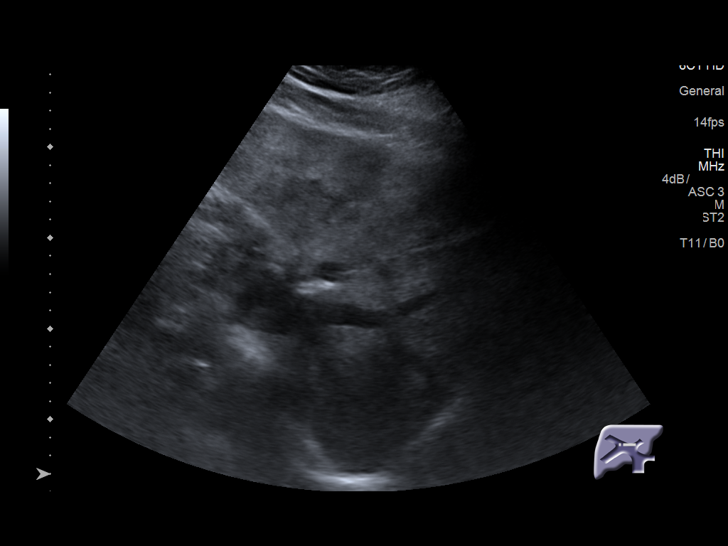
[im 34/45]
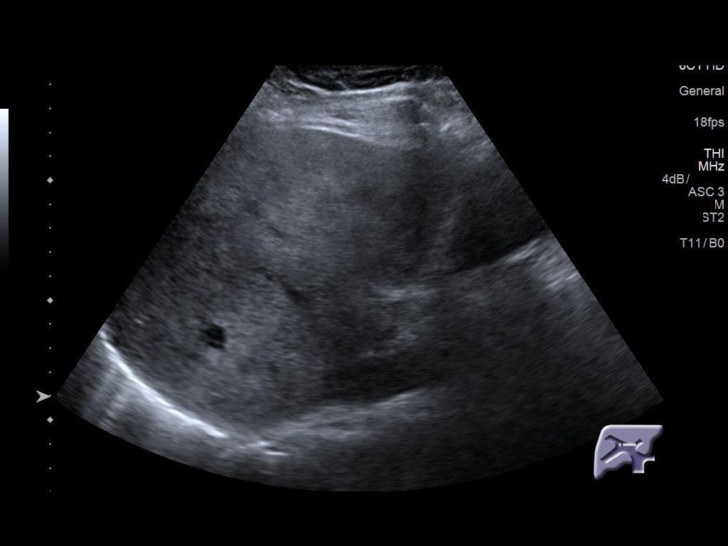
[im 37/45]
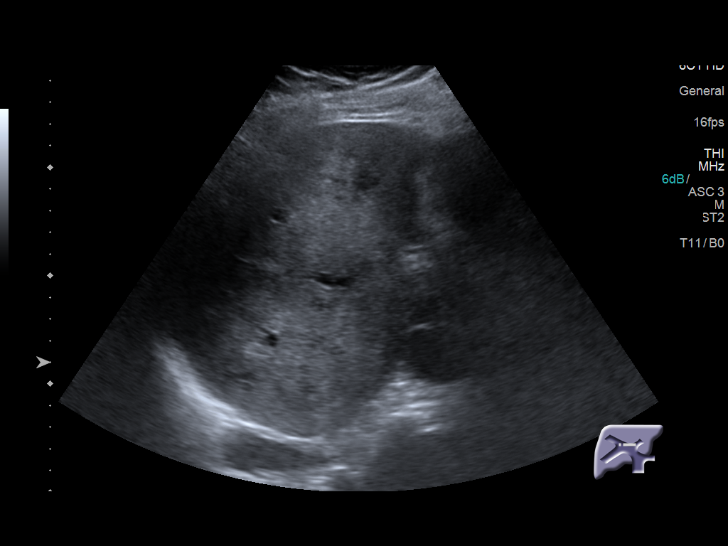
[im 41/45]
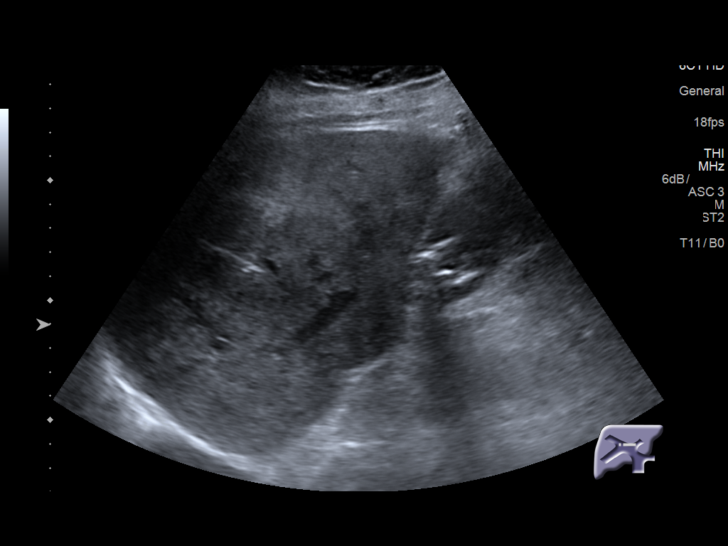
[im 45/45]
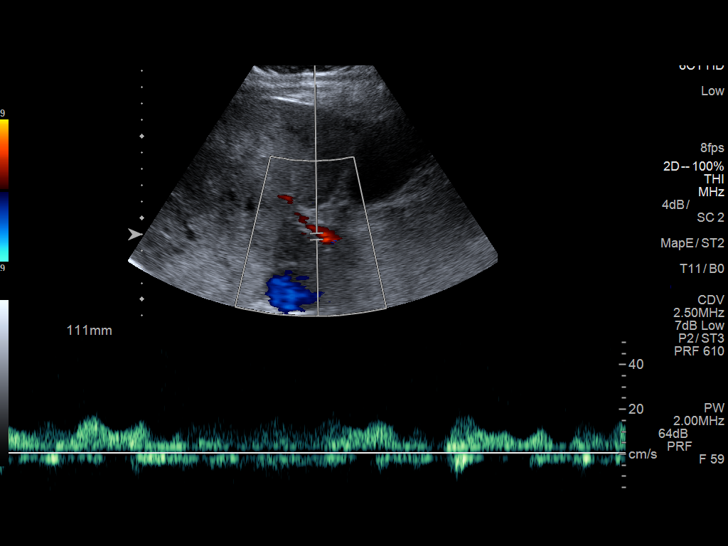

[14 of 25 positions shown; findings below may reference images not displayed]

FINDINGS: Gallbladder:

Multiple gallstones are noted with the largest measuring 18 mm. Mild
gallbladder wall thickening is noted measured at 4.2 mm. No definite
pericholecystic fluid or sonographic Murphy's sign is noted.

Common bile duct:

Diameter: 4.7 mm which is within normal limits.

Liver:

Hepatic parenchyma is heterogeneous in appearance, although definite
nodular contours are not visualized on this study. These findings
may represent fatty infiltration or other diffuse hepatocellular
disease.
IMPRESSION: Cholelithiasis is noted with mild gallbladder wall thickening, but
no pericholecystic fluid or sonographic Murphy's sign is noted. If
there is clinical concern for cholecystitis, HIDA scan may be
performed for further evaluation.

Heterogeneous echogenicity of hepatic parenchyma is noted without
definite nodular contours. This may represent fatty infiltration or
other diffuse hepatocellular disease.

## 2016-11-05 ENCOUNTER — Telehealth: Payer: Self-pay

## 2016-11-05 NOTE — Telephone Encounter (Signed)
**Note De-Identified  Obfuscation** The pt is advised that his Eliquis has arrived in the office  mail and that he can pick up at his convenience. He verbalized understanding and thanked me for my assistance.

## 2016-11-11 DIAGNOSIS — I509 Heart failure, unspecified: Secondary | ICD-10-CM | POA: Diagnosis not present

## 2016-11-11 DIAGNOSIS — Z7689 Persons encountering health services in other specified circumstances: Secondary | ICD-10-CM | POA: Diagnosis not present

## 2016-11-11 DIAGNOSIS — E119 Type 2 diabetes mellitus without complications: Secondary | ICD-10-CM | POA: Diagnosis not present

## 2016-11-11 DIAGNOSIS — Z6829 Body mass index (BMI) 29.0-29.9, adult: Secondary | ICD-10-CM | POA: Diagnosis not present

## 2016-11-11 DIAGNOSIS — E785 Hyperlipidemia, unspecified: Secondary | ICD-10-CM | POA: Diagnosis not present

## 2017-01-14 ENCOUNTER — Ambulatory Visit
Admission: RE | Admit: 2017-01-14 | Discharge: 2017-01-14 | Disposition: A | Discharge status: Home / Self Care | Payer: Medicare Other | Source: Ambulatory Visit | Attending: Interventional Cardiology | Admitting: Interventional Cardiology

## 2017-01-14 DIAGNOSIS — Z79899 Other long term (current) drug therapy: Secondary | ICD-10-CM

## 2017-01-14 DIAGNOSIS — J984 Other disorders of lung: Secondary | ICD-10-CM | POA: Diagnosis not present

## 2017-01-19 ENCOUNTER — Telehealth: Payer: Self-pay

## 2017-01-19 NOTE — Telephone Encounter (Signed)
spoke with patient about chest x-ray results. patient verbalized understanding. results sent to [facility].

## 2017-01-22 ENCOUNTER — Ambulatory Visit (INDEPENDENT_AMBULATORY_CARE_PROVIDER_SITE_OTHER): Payer: Medicare Other | Admitting: Interventional Cardiology

## 2017-01-22 ENCOUNTER — Encounter: Payer: Self-pay | Admitting: Interventional Cardiology

## 2017-01-22 VITALS — BP 138/72 | HR 62 | Ht 71.0 in | Wt 211.4 lb

## 2017-01-22 DIAGNOSIS — Z7901 Long term (current) use of anticoagulants: Secondary | ICD-10-CM | POA: Diagnosis not present

## 2017-01-22 DIAGNOSIS — Z79899 Other long term (current) drug therapy: Secondary | ICD-10-CM

## 2017-01-22 DIAGNOSIS — I480 Paroxysmal atrial fibrillation: Secondary | ICD-10-CM

## 2017-01-22 DIAGNOSIS — I2510 Atherosclerotic heart disease of native coronary artery without angina pectoris: Secondary | ICD-10-CM | POA: Diagnosis not present

## 2017-01-22 DIAGNOSIS — I5032 Chronic diastolic (congestive) heart failure: Secondary | ICD-10-CM | POA: Diagnosis not present

## 2017-01-22 DIAGNOSIS — I10 Essential (primary) hypertension: Secondary | ICD-10-CM | POA: Diagnosis not present

## 2017-01-22 NOTE — Patient Instructions (Signed)

## 2017-01-22 NOTE — Progress Notes (Signed)
Cardiology Office Note    Date:  01/22/2017   ID:  Jason [name], DOB 1940-06-26, MRN [id number]  PCP:  [facility]  Cardiologist: Lesleigh Noe, MD   Chief Complaint  Patient presents with  . Atrial Fibrillation    History of Present Illness:  Jason [name] is a 76 y.o. male who presents for follow-up of acute on chronic systolic heart failure (LVEF by echo 20-25%), atrial fibrillation of unknown duration, hypertension, and silent coronary disease denoted by calcium on CT scan. Recent cardiac catheterization demonstrated moderate coronary disease and return of LVEF to 55%.  Jason [name] has no complaints. His breathing is stable. He denies orthopnea. There is no lower extremity swelling. He denies chest pain. No medication side effects.   Past Medical History:  Diagnosis Date  . Atrial fibrillation (HCC)   . Diabetes mellitus without complication (HCC)   . Hypertension     Past Surgical History:  Procedure Laterality Date  . CARDIAC CATHETERIZATION N/A 11/16/2015   Procedure: Left Heart Cath and Coronary Angiography;  Surgeon: Lyn Records, MD;  Location: [facility] INVASIVE CV LAB;  Service: Cardiovascular;  Laterality: N/A;  . CARDIOVERSION N/A 08/28/2015   Procedure: CARDIOVERSION;  Surgeon: Lewayne Bunting, MD;  Location: [facility] ENDOSCOPY;  Service: Cardiovascular;  Laterality: N/A;  . HERNIA REPAIR    . KIDNEY STONE SURGERY      Current Medications: Outpatient Medications Prior to Visit  Medication Sig Dispense Refill  . acetaminophen (TYLENOL) 325 MG tablet Take 1-2 tablets (325-650 mg total) by mouth every 6 (six) hours as needed for mild pain, moderate pain, fever or headache.    Marland Kitchen amiodarone (PACERONE) 200 MG tablet Take 0.5 tablets (100 mg total) by mouth daily. 45 tablet 2  . apixaban (ELIQUIS) 5 MG TABS tablet Take 1 tablet (5 mg total) by mouth 2 (two) times daily. 180 tablet 3  . atorvastatin (LIPITOR) 40 MG tablet TAKE ONE TABLET BY MOUTH ONCE DAILY  AT  6  PM 90 tablet 3  . carvedilol (COREG) 6.25 MG tablet TAKE ONE TABLET BY MOUTH TWICE DAILY 180 tablet 3  . cetirizine (ZYRTEC) 10 MG tablet Take 10 mg by mouth daily as needed for allergies.    . furosemide (LASIX) 40 MG tablet Take 1 tablet (40 mg total) by mouth daily. 90 tablet 1  . lisinopril (PRINIVIL,ZESTRIL) 5 MG tablet Take 5 mg by mouth daily.    . metFORMIN (GLUCOPHAGE) 1000 MG tablet Take 1,000 mg by mouth 2 (two) times daily.    . Multiple Vitamins-Minerals (CENTRUM SILVER 50+MEN) TABS Take 1 tablet by mouth daily.     No facility-administered medications prior to visit.      Allergies:   Patient has no known allergies.   Social History   Social History  . Marital status: Married    Spouse name: N/A  . Number of children: N/A  . Years of education: N/A   Social History Main Topics  . Smoking status: Never Smoker  . Smokeless tobacco: Never Used  . Alcohol use No  . Drug use: No  . Sexual activity: No   Other Topics Concern  . None   Social History Narrative  . None     Family History:  The patient's family history includes Cancer in his father; Heart failure in his mother.   ROS:   Please see the history of present illness.    None  All other systems reviewed and are  negative.   PHYSICAL EXAM:   VS:  BP 138/72 (BP Location: Left Arm)   Pulse 62   Ht  (1.803 m)   Wt 211 lb 6.4 oz (95.9 kg)   BMI 29.48 kg/m    GEN: Well nourished, well developed, in no acute distress  HEENT: normal  Neck: no JVD, carotid bruits, or masses Cardiac: RRR; no murmurs, rubs, or gallops,no edema  Respiratory:  clear to auscultation bilaterally, normal work of breathing GI: soft, nontender, nondistended, + BS MS: no deformity or atrophy  Skin: warm and dry, no rash Neuro:  Alert and Oriented x 3, Strength and sensation are intact Psych: euthymic mood, full affect  Wt Readings from Last 3 Encounters:  01/22/17 211 lb 6.4 oz (95.9 kg)  08/12/16 212 lb 6.4 oz  (96.3 kg)  07/30/16 213 lb 9.6 oz (96.9 kg)      Studies/Labs Reviewed:   EKG:  EKG  Normal sinus rhythm, left axis deviation, incomplete right bundle branch block.  Recent Labs: 07/31/2016: ALT 15; BUN 20; Creatinine, Ser 1.21; Hemoglobin 11.8; Magnesium 1.4; Platelets 186; Potassium 3.9; Sodium 137; TSH 0.509   Lipid Panel    Component Value Date/Time   CHOL 130 07/29/2015 1857   TRIG 61 07/29/2015 1857   HDL 30 (L) 07/29/2015 1857   CHOLHDL 4.3 07/29/2015 1857   VLDL 12 07/29/2015 1857   LDLCALC 88 07/29/2015 1857    Additional studies/ records that were reviewed today include:  Recent chest x-ray demonstrated 01/14/2017: IMPRESSION: No active cardiopulmonary disease. Stable chronic mild pleural-parenchymal scarring at the right costophrenic angle.   Liver panel was unremarkable in July 2018 on a comprehensive metabolic panel performed by his primary care physician.  ASSESSMENT:    1. Paroxysmal atrial fibrillation (HCC)   2. Essential hypertension   3. On amiodarone therapy   4. Chronic diastolic heart failure (HCC)   5. Coronary artery calcification seen on CT scan   6. Chronic anticoagulation      PLAN:  In order of problems listed above:  1. Stable sinus rhythm on 100 mg of amiodarone daily. 2. Excellent blood pressure control on the current medical regimen. Low salt diet rediscussed. 3. Continue amiodarone, low-dose at 100 mg per day to control rhythm. Recent labs and chest x-ray demonstrated no evidence of toxicity. Plan TSH and hepatic panel in 6 months. 4. No evidence of volume overload.  5. Not addressed 6. Continue Eliquis.  Clinical follow-up in 6 months with TSH and hepatic panel. No change in medical regimen.  Medication Adjustments/Labs and Tests Ordered: Current medicines are reviewed at length with the patient today.  Concerns regarding medicines are outlined above.  Medication changes, Labs and Tests ordered today are listed in the Patient  Instructions below. Patient Instructions  Medication Instructions:  Your physician recommends that you continue on your current medications as directed. Please refer to the Current Medication list given to you today.  Labwork: None  Testing/Procedures: None  Follow-Up: Your physician wants you to follow-up in:  6 months with Dr. Katrinka Blazing.  You will receive a reminder letter in the mail two months in advance. If you don't receive a letter, please call our office to schedule the follow-up appointment.   Any Other Special Instructions Will Be Listed Below (If Applicable).     If you need a refill on your cardiac medications before your next appointment, please call your pharmacy.      Signed, Lesleigh Noe, MD  01/22/2017  3:39 PM    [facility] [address] [place], [place], [place]  [address] Phone: [phone]; Fax: [phone]

## 2017-01-28 ENCOUNTER — Telehealth: Payer: Self-pay | Admitting: *Deleted

## 2017-01-28 NOTE — Telephone Encounter (Signed)
Called patient to inform him that his eliquis had arrived at the office and that I would place it at the front desk for him to pick up at his convenience. He verbalized his understanding and thanked me for the call.

## 2017-02-03 ENCOUNTER — Other Ambulatory Visit: Payer: Self-pay | Admitting: Physician Assistant

## 2017-02-25 DIAGNOSIS — E119 Type 2 diabetes mellitus without complications: Secondary | ICD-10-CM | POA: Diagnosis not present

## 2017-02-25 DIAGNOSIS — I509 Heart failure, unspecified: Secondary | ICD-10-CM | POA: Diagnosis not present

## 2017-02-25 DIAGNOSIS — E663 Overweight: Secondary | ICD-10-CM | POA: Diagnosis not present

## 2017-02-27 ENCOUNTER — Other Ambulatory Visit: Payer: Self-pay | Admitting: Physician Assistant

## 2017-05-08 DIAGNOSIS — H35033 Hypertensive retinopathy, bilateral: Secondary | ICD-10-CM | POA: Diagnosis not present

## 2017-05-08 DIAGNOSIS — H18013 Anterior corneal pigmentations, bilateral: Secondary | ICD-10-CM | POA: Diagnosis not present

## 2017-05-08 DIAGNOSIS — E119 Type 2 diabetes mellitus without complications: Secondary | ICD-10-CM | POA: Diagnosis not present

## 2017-05-08 DIAGNOSIS — H5203 Hypermetropia, bilateral: Secondary | ICD-10-CM | POA: Diagnosis not present

## 2017-05-17 IMAGING — CT CT ANGIO CHEST
2 of 10 series · 14 of 36 positions shown · IV contrast (Iohexol (Omnipaque 350))
Comparison: Portable chest same date.

CLINICAL DATA: Acute onset of shortness of breath, diaphoresis and
tachypnea. Evaluate for pulmonary embolism.

EXAM:
CT ANGIOGRAPHY CHEST WITH CONTRAST
TECHNIQUE: Multidetector CT imaging of the chest was performed using the
standard protocol during bolus administration of intravenous
contrast. Multiplanar CT image reconstructions and MIPs were
obtained to evaluate the vascular anatomy.
CONTRAST:  90 ml Isovue 370.

[Series 502: thins pacs · axial · 0.72mm/px · z∈[-311,-59]mm · 13 of 295 slices shown]
[im 22/295  lung]
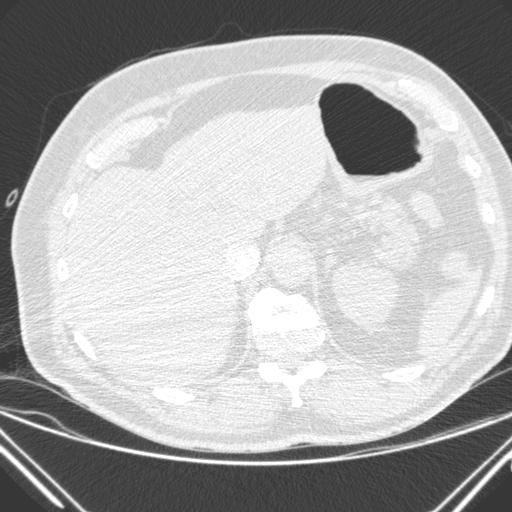
[im 43/295  mediastinal]
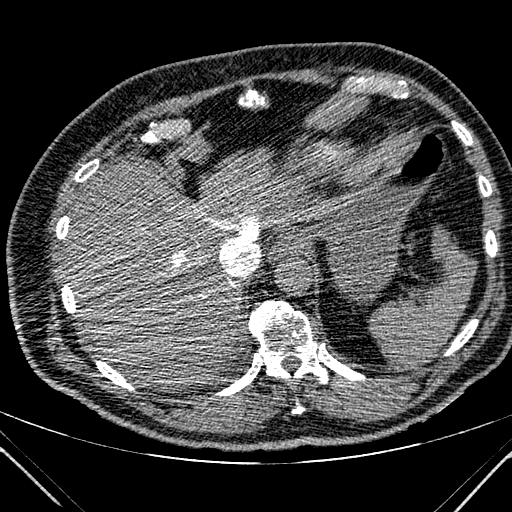
[im 64/295  lung]
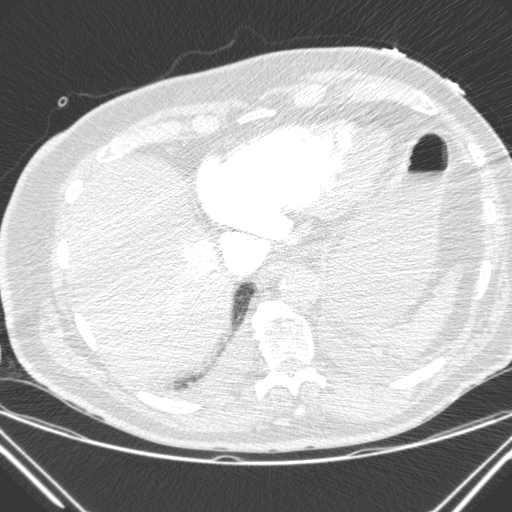
[im 85/295  mediastinal]
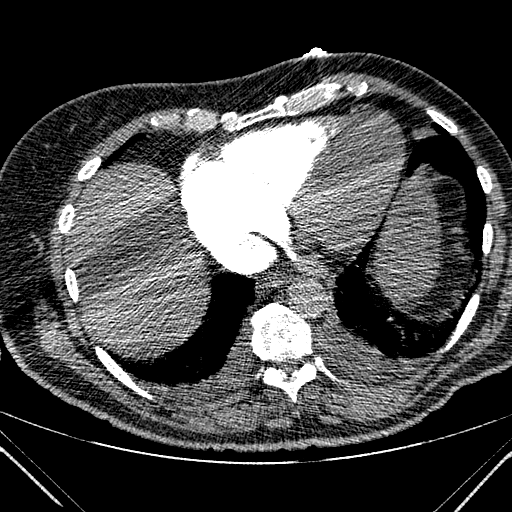
[im 106/295  lung]
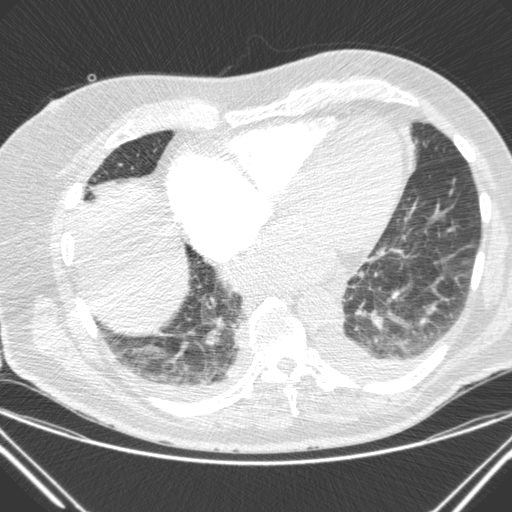
[im 127/295  mediastinal]
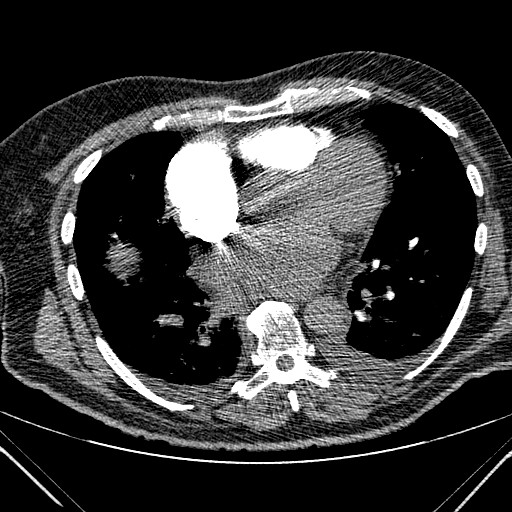
[im 148/295  lung]
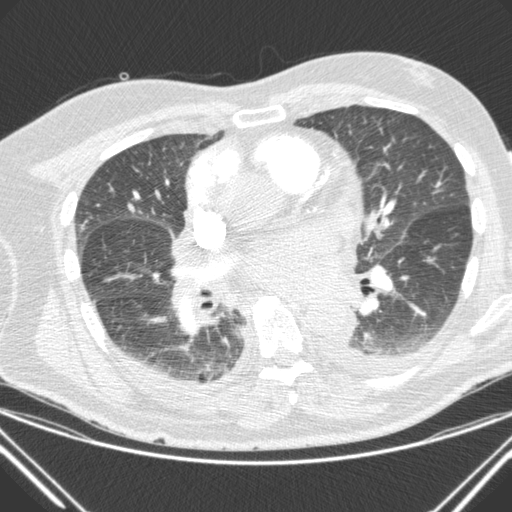
[im 169/295  mediastinal]
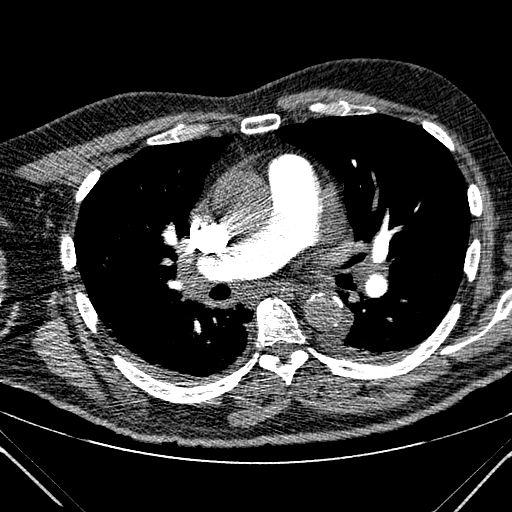
[im 190/295  lung]
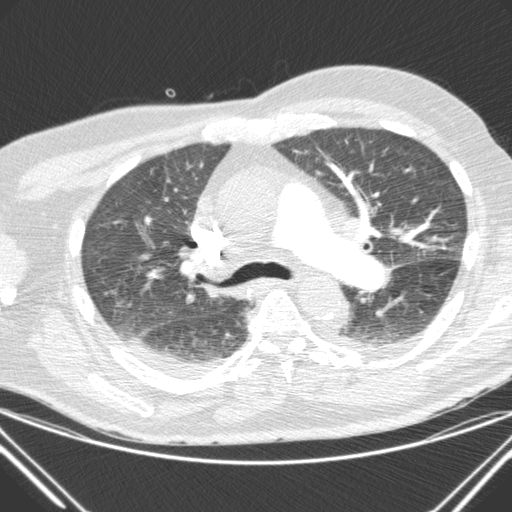
[im 211/295  mediastinal]
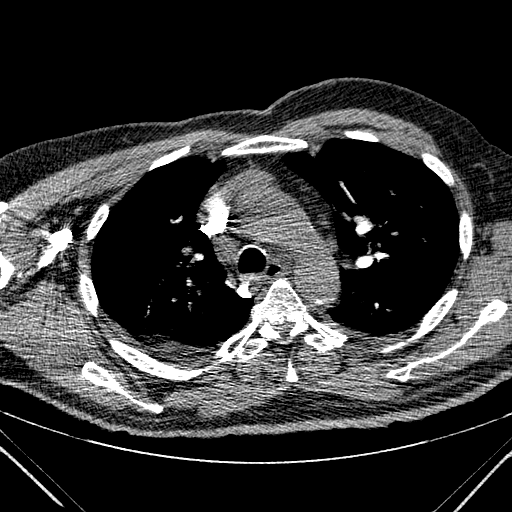
[im 232/295  lung]
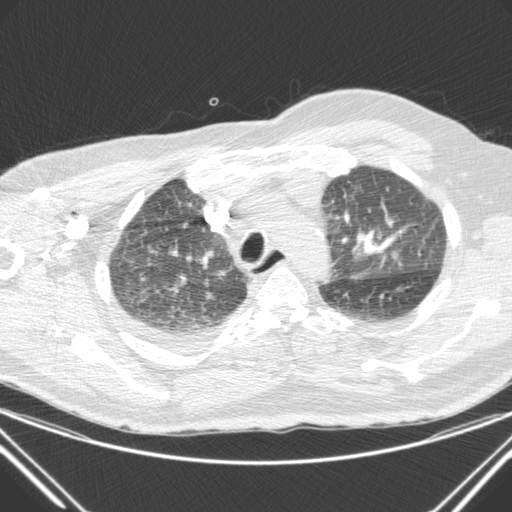
[im 253/295  mediastinal]
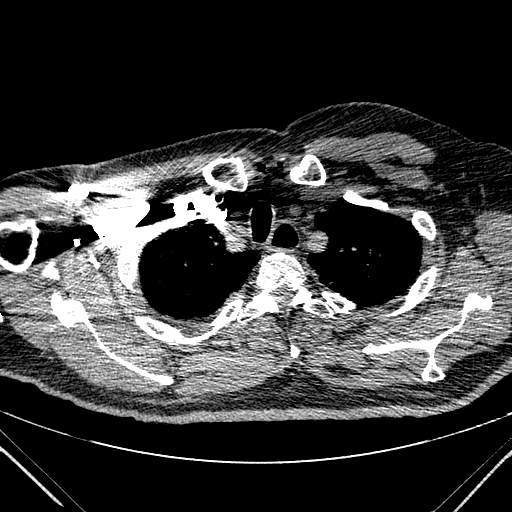
[im 274/295  lung]
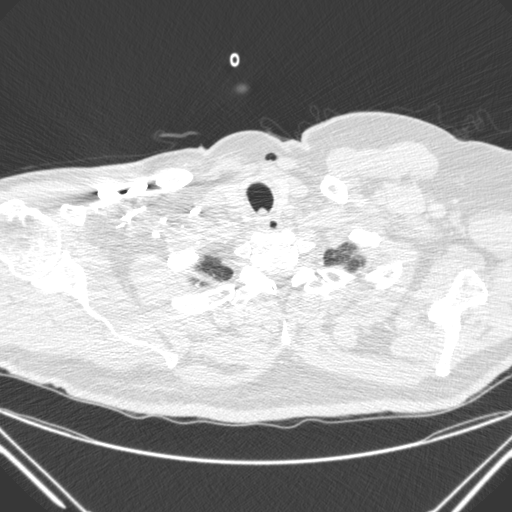

[Series 504: coronal · coronal · 0.72mm/px · 1 of 130 slices shown]
[im 65/130  mediastinal]
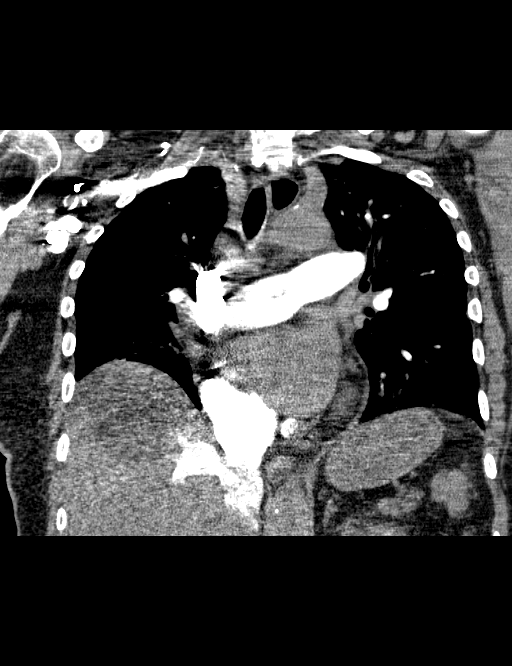

[14 of 36 positions shown; findings below may reference images not displayed]

FINDINGS: Mediastinum: The pulmonary arteries are incompletely opacified with
contrast. There is heterogeneous mixing of contrast in the central
pulmonary arteries and incomplete filling of the subsegmental
branches. No evidence of acute pulmonary embolism. There is reflux
of contrast into the hepatic veins and IVC. There is no
opacification of the systemic vessels. Moderate atherosclerosis of
the aorta, great vessels and coronary arteries is noted. The heart
is mildly enlarged. There is no pericardial effusion. There are
prominent hilar lymph nodes bilaterally. No enlarged axillary or
mediastinal lymph nodes are seen. The thyroid gland, trachea and
esophagus demonstrate no significant findings.

Lungs/Pleura: There are small bilateral pleural effusions.The lungs
demonstrate emphysema, diffuse central airway thickening and
probable dependent bibasilar atelectasis. There is no consolidation
or suspicious pulmonary nodule.

Upper abdomen: Mild contour irregularity of the liver suspicious for
cirrhosis. The visualized adrenal glands appear unremarkable.

Musculoskeletal/Chest wall: No chest wall lesion or acute osseous
findings.

Review of the MIP images confirms the above findings.
IMPRESSION: 1. No evidence of acute pulmonary embolism. Evaluation of the distal
pulmonary arterial branches is limited.
2. Cardiomegaly with reflux of contrast into IVC and hepatic veins
suggesting right heart failure. Associated bilateral pleural
effusions.
3. Nonspecific bilateral hilar nodal prominence.
4. Mild emphysema with chronic central airway thickening.
5. Suspected hepatic cirrhosis

## 2017-06-04 ENCOUNTER — Telehealth: Payer: Self-pay | Admitting: *Deleted

## 2017-06-04 NOTE — Telephone Encounter (Signed)
Received a call from patients daughter asking if we had received [facility] January, I could not find it, she is going to refax. This is for his ELIQUIS.

## 2017-06-08 ENCOUNTER — Other Ambulatory Visit: Payer: Self-pay

## 2017-06-08 MED ORDER — APIXABAN 5 MG PO TABS: 5.0000 mg | ORAL_TABLET | Freq: Two times a day (BID) | ORAL | 3 refills | Status: DC

## 2017-06-08 NOTE — Telephone Encounter (Signed)
**Note De-Identified  Obfuscation** Received the pts [employer] pt assistance application for Eliquis. I have completed the provider part of the application, printed a Eliquis RX and placed both in Dr Lonn GeorgiaSmiths mail bin awaiting his signature.

## 2017-06-10 DIAGNOSIS — E663 Overweight: Secondary | ICD-10-CM | POA: Diagnosis not present

## 2017-06-10 DIAGNOSIS — E785 Hyperlipidemia, unspecified: Secondary | ICD-10-CM | POA: Diagnosis not present

## 2017-06-10 DIAGNOSIS — I4891 Unspecified atrial fibrillation: Secondary | ICD-10-CM | POA: Diagnosis not present

## 2017-06-10 DIAGNOSIS — I509 Heart failure, unspecified: Secondary | ICD-10-CM | POA: Diagnosis not present

## 2017-06-10 DIAGNOSIS — E119 Type 2 diabetes mellitus without complications: Secondary | ICD-10-CM | POA: Diagnosis not present

## 2017-06-15 ENCOUNTER — Encounter: Payer: Self-pay | Admitting: Interventional Cardiology

## 2017-06-15 NOTE — Telephone Encounter (Signed)
**Note De-Identified  Obfuscation** Dr Katrinka BlazingSmith has signed the pts [employer] pt asst application for Eliquis and I have faxed it to [employer].  The pt is aware.

## 2017-07-02 DIAGNOSIS — J0100 Acute maxillary sinusitis, unspecified: Secondary | ICD-10-CM | POA: Diagnosis not present

## 2017-07-02 DIAGNOSIS — J209 Acute bronchitis, unspecified: Secondary | ICD-10-CM | POA: Diagnosis not present

## 2017-09-01 ENCOUNTER — Other Ambulatory Visit: Payer: Self-pay | Admitting: Interventional Cardiology

## 2017-09-02 DIAGNOSIS — J209 Acute bronchitis, unspecified: Secondary | ICD-10-CM | POA: Diagnosis not present

## 2017-09-07 ENCOUNTER — Other Ambulatory Visit: Payer: Self-pay | Admitting: Cardiology

## 2017-09-14 DIAGNOSIS — E785 Hyperlipidemia, unspecified: Secondary | ICD-10-CM | POA: Diagnosis not present

## 2017-09-14 DIAGNOSIS — E119 Type 2 diabetes mellitus without complications: Secondary | ICD-10-CM | POA: Diagnosis not present

## 2017-09-14 DIAGNOSIS — I509 Heart failure, unspecified: Secondary | ICD-10-CM | POA: Diagnosis not present

## 2017-09-14 DIAGNOSIS — I4891 Unspecified atrial fibrillation: Secondary | ICD-10-CM | POA: Diagnosis not present

## 2017-09-28 ENCOUNTER — Other Ambulatory Visit: Payer: Self-pay | Admitting: Interventional Cardiology

## 2017-11-01 NOTE — Progress Notes (Signed)
Cardiology Office Note:    Date:  11/03/2017   ID:  Jason [name], DOB 27-Nov-1940, MRN [id number]  PCP:  Jason [name]  Cardiologist:  No primary care provider on file.   Referring MD: Associates, Jason [name]*   Chief Complaint  Patient presents with  . Coronary Artery Disease  . Congestive Heart Failure    History of Present Illness:    Jason [name] is a 77 y.o. male with a hx of acute on chronic systolic heart failure (LVEF by echo 20-25%), atrial fibrillation of unknown duration, hypertension, and silent coronary disease denoted by calcium on CT scan. Recent cardiac catheterization demonstrated moderate coronary disease and return of LVEF to 55%.  Is doing well.  He is asymptomatic.  He denies orthopnea, PND, chest pain, and edema.  He has not had syncope.  No instances of palpitation or transient neurological change.  No blood in the urine or stool.  Past [name] History:  Diagnosis Date  . Atrial fibrillation (HCC)   . Diabetes mellitus without complication (HCC)   . Hypertension     Past Surgical History:  Procedure Laterality Date  . CARDIAC CATHETERIZATION N/A 11/16/2015   Procedure: Left Heart Cath and Coronary Angiography;  Surgeon: Jason Records, MD;  Location: [name] INVASIVE CV LAB;  Service: Cardiovascular;  Laterality: N/A;  . CARDIOVERSION N/A 08/28/2015   Procedure: CARDIOVERSION;  Surgeon: Jason Bunting, MD;  Location: [name] ENDOSCOPY;  Service: Cardiovascular;  Laterality: N/A;  . HERNIA REPAIR    . KIDNEY STONE SURGERY      Current Medications: Current Meds  Medication Sig  . acetaminophen (TYLENOL) 325 MG tablet Take 1-2 tablets (325-650 mg total) by mouth every 6 (six) hours as needed for mild pain, moderate pain, fever or headache.  Marland Kitchen amiodarone (PACERONE) 200 MG tablet TAKE 1/2 (ONE-HALF) TABLET BY MOUTH ONCE DAILY  . apixaban (ELIQUIS) 5 MG TABS tablet Take 1 tablet (5 mg total) by mouth 2 (two) times daily.  Marland Kitchen atorvastatin (LIPITOR) 40  MG tablet TAKE 1 TABLET BY MOUTH ONCE DAILY AT 6PM  . carvedilol (COREG) 6.25 MG tablet TAKE 1 TABLET BY MOUTH TWICE DAILY  . cetirizine (ZYRTEC) 10 MG tablet Take 10 mg by mouth daily as needed for allergies.  . furosemide (LASIX) 40 MG tablet TAKE 1 TABLET BY MOUTH ONCE DAILY  . lisinopril (PRINIVIL,ZESTRIL) 5 MG tablet TAKE ONE TABLET BY MOUTH ONCE DAILY  . metFORMIN (GLUCOPHAGE) 1000 MG tablet Take 1,000 mg by mouth 2 (two) times daily.  . Multiple Vitamins-Minerals (CENTRUM SILVER 50+MEN) TABS Take 1 tablet by mouth daily.     Allergies:   Patient has no known allergies.   Social History   Socioeconomic History  . Marital status: Married    Spouse name: Not on file  . Number of children: Not on file  . Years of education: Not on file  . Highest education level: Not on file  Occupational History  . Not on file  Social Needs  . Financial resource strain: Not on file  . Food insecurity:    Worry: Not on file    Inability: Not on file  . Transportation needs:    [name]: Not on file    Non-[name]: Not on file  Tobacco Use  . Smoking status: Never Smoker  . Smokeless tobacco: Never Used  Substance and Sexual Activity  . Alcohol use: No  . Drug use: No  . Sexual activity: Never  Lifestyle  . Physical activity:  Days per week: Not on file    Minutes per session: Not on file  . Stress: Not on file  Relationships  . Social connections:    Talks on phone: Not on file    Gets together: Not on file    Attends religious service: Not on file    Active member of club or organization: Not on file    Attends meetings of clubs or organizations: Not on file    Relationship status: Not on file  Other Topics Concern  . Not on file  Social History Narrative  . Not on file     Family History: The patient's family history includes Cancer in his father; Heart failure in his mother. There is no history of Heart attack or Hypertension.  ROS:   Please see the history of present  illness.    Shortness of breath with moderate physical activity, anxiety, irregular heartbeats, skipped heartbeats, no prolonged racing, back pain, and easy bruising.Marland Kitchen  Appetite is been stable.  All other systems reviewed and are negative.  EKGs/Labs/Other Studies Reviewed:    The following studies were reviewed today: Reviewed 2017 coronary angiogram and echocardiogram.  EKG:  EKG is  ordered today.  The ekg ordered today demonstrates sinus bradycardia at 53 bpm and otherwise normal.  Recent Labs: No results found for requested labs within last 8760 hours.  Recent Lipid Panel    Component Value Date/Time   CHOL 130 07/29/2015 1857   TRIG 61 07/29/2015 1857   HDL 30 (L) 07/29/2015 1857   CHOLHDL 4.3 07/29/2015 1857   VLDL 12 07/29/2015 1857   LDLCALC 88 07/29/2015 1857    Physical Exam:    VS:  BP 116/68   Pulse (!) 53   Ht 5\' 11"  (1.803 m)   Wt 197 lb 6.4 oz (89.5 kg)   BMI 27.53 kg/m     Wt Readings from Last 3 Encounters:  11/03/17 197 lb 6.4 oz (89.5 kg)  01/22/17 211 lb 6.4 oz (95.9 kg)  08/12/16 212 lb 6.4 oz (96.3 kg)     GEN:  Well nourished, well developed in no acute distress HEENT: Normal NECK: No JVD. LYMPHATICS: No lymphadenopathy CARDIAC: RRR, no murmur, no gallop, no edema. VASCULAR: Plus radial and posterior tibial pulses.  No carotid bruits. RESPIRATORY:  Clear to auscultation without rales, wheezing or rhonchi  ABDOMEN: Soft, non-tender, non-distended, No pulsatile mass, MUSCULOSKELETAL: No deformity  SKIN: Warm and dry NEUROLOGIC:  Alert and oriented x 3 PSYCHIATRIC:  Normal affect   ASSESSMENT:    1. Paroxysmal atrial fibrillation (HCC)   2. Coronary artery calcification seen on CT scan   3. Cirrhosis of liver without ascites, unspecified hepatic cirrhosis type (HCC)   4. Chronic diastolic heart failure (HCC)   5. Chronic anticoagulation   6. Essential hypertension   7. Long term current use of amiodarone    PLAN:    In order of  problems listed above:  1. Maintaining sinus rhythm on low-dose amiodarone 2. No ischemic symptoms. 3. Not addressed 4. No evidence of volume overload.  Heart catheterization performed in 2017 demonstrated improvement in LV function to an EF of 50%.  On his next office visit we will repeat an echocardiogram to reassure that LV function has remained stable. 5. Continue Eliquis 5 mg twice daily.  Caution to visually examine stools and urine. 6. Target blood pressure 130/80 mmHg or less. 7. Continue amiodarone 100 mg daily.  TSH and liver panel today and in 6  months on return.  In 6 months he will also need a PA and lateral chest x-ray.   Medication Adjustments/Labs and Tests Ordered: Current medicines are reviewed at length with the patient today.  Concerns regarding medicines are outlined above.  Orders Placed This Encounter  Procedures  . TSH  . Hepatic function panel  . EKG 12-Lead  . ECHOCARDIOGRAM COMPLETE   No orders of the defined types were placed in this encounter.   Patient Instructions  Medication Instructions:  Your physician recommends that you continue on your current medications as directed. Please refer to the Current Medication list given to you today.  Labwork: Today: TSH and Liver panel  Testing/Procedures: Your physician has requested that you have an echocardiogram just prior to seeing Dr. Katrinka BlazingSmith in 6 months. Echocardiography is a painless test that uses sound waves to create images of your heart. It provides your doctor with information about the size and shape of your heart and how well your heart's chambers and valves are working. This procedure takes approximately one hour. There are no restrictions for this procedure.  Follow-Up: Your physician wants you to follow-up in: 6 month with Dr. Katrinka BlazingSmith. You will receive a reminder letter in the mail two months in advance. If you don't receive a letter, please call our office to schedule the follow-up appointment.  If  you need a refill on your cardiac medications before your next appointment, please call your pharmacy.      Signed, Lesleigh NoeHenry W Smith III, MD  11/03/2017 12:03 PM    [name]

## 2017-11-03 ENCOUNTER — Ambulatory Visit (INDEPENDENT_AMBULATORY_CARE_PROVIDER_SITE_OTHER): Payer: Medicare Other | Admitting: Interventional Cardiology

## 2017-11-03 ENCOUNTER — Encounter: Payer: Self-pay | Admitting: Interventional Cardiology

## 2017-11-03 VITALS — BP 116/68 | HR 53 | Ht 71.0 in | Wt 197.4 lb

## 2017-11-03 DIAGNOSIS — I5032 Chronic diastolic (congestive) heart failure: Secondary | ICD-10-CM | POA: Diagnosis not present

## 2017-11-03 DIAGNOSIS — I480 Paroxysmal atrial fibrillation: Secondary | ICD-10-CM

## 2017-11-03 DIAGNOSIS — I2510 Atherosclerotic heart disease of native coronary artery without angina pectoris: Secondary | ICD-10-CM

## 2017-11-03 DIAGNOSIS — Z79899 Other long term (current) drug therapy: Secondary | ICD-10-CM | POA: Diagnosis not present

## 2017-11-03 DIAGNOSIS — K7460 Unspecified cirrhosis of liver: Secondary | ICD-10-CM

## 2017-11-03 DIAGNOSIS — I10 Essential (primary) hypertension: Secondary | ICD-10-CM

## 2017-11-03 DIAGNOSIS — Z7901 Long term (current) use of anticoagulants: Secondary | ICD-10-CM

## 2017-11-03 NOTE — Patient Instructions (Signed)
Medication Instructions:  Your physician recommends that you continue on your current medications as directed. Please refer to the Current Medication list given to you today.  Labwork: Today: TSH and Liver panel  Testing/Procedures: Your physician has requested that you have an echocardiogram just prior to seeing Dr. Katrinka BlazingSmith in 6 months. Echocardiography is a painless test that uses sound waves to create images of your heart. It provides your doctor with information about the size and shape of your heart and how well your heart's chambers and valves are working. This procedure takes approximately one hour. There are no restrictions for this procedure.  Follow-Up: Your physician wants you to follow-up in: 6 month with Dr. Katrinka BlazingSmith. You will receive a reminder letter in the mail two months in advance. If you don't receive a letter, please call our office to schedule the follow-up appointment.  If you need a refill on your cardiac medications before your next appointment, please call your pharmacy.

## 2017-11-04 DIAGNOSIS — H25013 Cortical age-related cataract, bilateral: Secondary | ICD-10-CM | POA: Diagnosis not present

## 2017-11-04 DIAGNOSIS — H2513 Age-related nuclear cataract, bilateral: Secondary | ICD-10-CM | POA: Diagnosis not present

## 2017-11-04 LAB — HEPATIC FUNCTION PANEL
ALT: 11 IU/L (ref 0–44)
AST: 18 IU/L (ref 0–40)
Albumin: 4.7 g/dL (ref 3.5–4.8)
Alkaline Phosphatase: 79 IU/L (ref 39–117)
BILIRUBIN, DIRECT: 0.17 mg/dL (ref 0.00–0.40)
Bilirubin Total: 0.6 mg/dL (ref 0.0–1.2)
TOTAL PROTEIN: 7.1 g/dL (ref 6.0–8.5)

## 2017-11-04 LAB — TSH: TSH: 2.64 u[IU]/mL (ref 0.450–4.500)

## 2017-11-17 ENCOUNTER — Other Ambulatory Visit: Payer: Self-pay | Admitting: Physician Assistant

## 2017-12-15 DIAGNOSIS — I4891 Unspecified atrial fibrillation: Secondary | ICD-10-CM | POA: Diagnosis not present

## 2017-12-15 DIAGNOSIS — I509 Heart failure, unspecified: Secondary | ICD-10-CM | POA: Diagnosis not present

## 2017-12-15 DIAGNOSIS — E119 Type 2 diabetes mellitus without complications: Secondary | ICD-10-CM | POA: Diagnosis not present

## 2017-12-15 DIAGNOSIS — Z1331 Encounter for screening for depression: Secondary | ICD-10-CM | POA: Diagnosis not present

## 2017-12-15 DIAGNOSIS — E785 Hyperlipidemia, unspecified: Secondary | ICD-10-CM | POA: Diagnosis not present

## 2017-12-15 DIAGNOSIS — Z9181 History of falling: Secondary | ICD-10-CM | POA: Diagnosis not present

## 2018-01-05 ENCOUNTER — Other Ambulatory Visit: Payer: Self-pay | Admitting: Physician Assistant

## 2018-01-22 DIAGNOSIS — Z23 Encounter for immunization: Secondary | ICD-10-CM | POA: Diagnosis not present

## 2018-02-02 DIAGNOSIS — H401132 Primary open-angle glaucoma, bilateral, moderate stage: Secondary | ICD-10-CM | POA: Diagnosis not present

## 2018-02-02 DIAGNOSIS — H25013 Cortical age-related cataract, bilateral: Secondary | ICD-10-CM | POA: Diagnosis not present

## 2018-02-02 DIAGNOSIS — H25043 Posterior subcapsular polar age-related cataract, bilateral: Secondary | ICD-10-CM | POA: Diagnosis not present

## 2018-02-02 DIAGNOSIS — H2511 Age-related nuclear cataract, right eye: Secondary | ICD-10-CM | POA: Diagnosis not present

## 2018-02-02 DIAGNOSIS — H18413 Arcus senilis, bilateral: Secondary | ICD-10-CM | POA: Diagnosis not present

## 2018-02-02 DIAGNOSIS — H2513 Age-related nuclear cataract, bilateral: Secondary | ICD-10-CM | POA: Diagnosis not present

## 2018-02-02 DIAGNOSIS — H348122 Central retinal vein occlusion, left eye, stable: Secondary | ICD-10-CM | POA: Diagnosis not present

## 2018-03-01 ENCOUNTER — Telehealth: Payer: Self-pay

## 2018-03-01 NOTE — Telephone Encounter (Signed)
**Note De-Identified  Obfuscation** The pt brought in his [employer] [facility] [facility] [employer] application for Eliquis (did not bring his proof of income or his out of pocket expense report. The pt has no insurance so I will fax application as is as he my not have to provide that info).   I have completed the provider part of the application and placed it in Dr Lonn GeorgiaSmiths mail bin awaiting his signature.

## 2018-03-05 NOTE — Telephone Encounter (Signed)
Dr Katrinka BlazingSmith has signed the [employer] pt asst application and [facility]

## 2018-03-06 ENCOUNTER — Other Ambulatory Visit: Payer: Self-pay | Admitting: Cardiology

## 2018-03-09 DIAGNOSIS — E119 Type 2 diabetes mellitus without complications: Secondary | ICD-10-CM | POA: Diagnosis not present

## 2018-03-09 DIAGNOSIS — H3562 Retinal hemorrhage, left eye: Secondary | ICD-10-CM | POA: Diagnosis not present

## 2018-03-09 DIAGNOSIS — H348122 Central retinal vein occlusion, left eye, stable: Secondary | ICD-10-CM | POA: Diagnosis not present

## 2018-03-09 DIAGNOSIS — H43813 Vitreous degeneration, bilateral: Secondary | ICD-10-CM | POA: Diagnosis not present

## 2018-03-16 DIAGNOSIS — E785 Hyperlipidemia, unspecified: Secondary | ICD-10-CM | POA: Diagnosis not present

## 2018-03-16 DIAGNOSIS — I509 Heart failure, unspecified: Secondary | ICD-10-CM | POA: Diagnosis not present

## 2018-03-16 DIAGNOSIS — E119 Type 2 diabetes mellitus without complications: Secondary | ICD-10-CM | POA: Diagnosis not present

## 2018-03-16 DIAGNOSIS — I4891 Unspecified atrial fibrillation: Secondary | ICD-10-CM | POA: Diagnosis not present

## 2018-03-22 DIAGNOSIS — H25011 Cortical age-related cataract, right eye: Secondary | ICD-10-CM | POA: Diagnosis not present

## 2018-03-22 DIAGNOSIS — H2511 Age-related nuclear cataract, right eye: Secondary | ICD-10-CM | POA: Diagnosis not present

## 2018-03-23 DIAGNOSIS — H2512 Age-related nuclear cataract, left eye: Secondary | ICD-10-CM | POA: Diagnosis not present

## 2018-03-23 NOTE — Telephone Encounter (Signed)
Letter received via fax from [employer] stating that they need a list of the pts allergies and medications. I have faxed that info to them.

## 2018-04-12 DIAGNOSIS — H2512 Age-related nuclear cataract, left eye: Secondary | ICD-10-CM | POA: Diagnosis not present

## 2018-04-12 DIAGNOSIS — H25012 Cortical age-related cataract, left eye: Secondary | ICD-10-CM | POA: Diagnosis not present

## 2018-04-27 ENCOUNTER — Ambulatory Visit (HOSPITAL_COMMUNITY): Payer: Medicare Other | Attending: Cardiology

## 2018-04-27 ENCOUNTER — Other Ambulatory Visit: Payer: Self-pay

## 2018-04-27 DIAGNOSIS — I5032 Chronic diastolic (congestive) heart failure: Secondary | ICD-10-CM | POA: Diagnosis not present

## 2018-05-03 NOTE — Telephone Encounter (Signed)
Letter received from [employer] stating that they have approved the pt for pt asst with Eliquis. Approval good from 04/30/2018 until 04/14/2019.

## 2018-05-05 NOTE — Progress Notes (Signed)
Cardiology Office Note:    Date:  05/06/2018   ID:  Jason MerinoLewis S Tift, DOB [birth year], MRN [id number]  [facility]P:  [facility]  Cardiologist:  No primary care provider on file.   Referring MD: [facility], Duke Salviaandolph Me*   Chief Complaint  Patient presents with  . Atrial Fibrillation  . Congestive Heart Failure    History of Present Illness:    Jason [name] is a 78 y.o. male with a hx of  acute on chronic systolic heart failure (LVEF by echo 20-25%), atrial fibrillation of unknown duration, hypertension, and silent coronary diseasedenotedby calcium on CT scan. Recent cardiac catheterization demonstrated moderate coronary disease and return of LVEF to 55%.  He feels well.  He denies orthopnea, PND, palpitations, and syncope.  There are no episodes of chest pain.  He has not had lower extremity swelling.  He denies irregular heartbeat.  He denies exertional intolerance.  There have been no neurological events.  No blood in his urine or stool.  No specific medication side effects.  Past Medical History:  Diagnosis Date  . Atrial fibrillation (HCC)   . Diabetes mellitus without complication (HCC)   . Hypertension     Past Surgical History:  Procedure Laterality Date  . CARDIAC CATHETERIZATION N/A 11/16/2015   Procedure: Left Heart Cath and Coronary Angiography;  Surgeon: Lyn RecordsHenry W , MD;  [facility];  Service: Cardiovascular;  Laterality: N/A;  . CARDIOVERSION N/A 08/28/2015   Procedure: CARDIOVERSION;  Surgeon: Lewayne BuntingBrian S Crenshaw, MD;  [facility];  Service: Cardiovascular;  Laterality: N/A;  . HERNIA REPAIR    . KIDNEY STONE SURGERY      Current Medications: Current Meds  Medication Sig  . acetaminophen (TYLENOL) 325 MG tablet Take 1-2 tablets (325-650 mg total) by mouth every 6 (six) hours as needed for mild pain, moderate pain, fever or headache.  Marland Kitchen. amiodarone (PACERONE) 200 MG tablet TAKE 1/2 (ONE-HALF) TABLET BY MOUTH ONCE DAILY  .  apixaban (ELIQUIS) 5 MG TABS tablet Take 1 tablet (5 mg total) by mouth 2 (two) times daily.  Marland Kitchen. atorvastatin (LIPITOR) 40 MG tablet TAKE 1 TABLET BY MOUTH ONCE DAILY AT 6PM  . carvedilol (COREG) 6.25 MG tablet TAKE 1 TABLET BY MOUTH TWICE DAILY  . cetirizine (ZYRTEC) 10 MG tablet Take 10 mg by mouth daily as needed for allergies.  . furosemide (LASIX) 40 MG tablet TAKE 1 TABLET BY MOUTH ONCE DAILY  . glipiZIDE (GLUCOTROL XL) 2.5 MG 24 hr tablet Take 2.5 mg by mouth daily.  Marland Kitchen. lisinopril (PRINIVIL,ZESTRIL) 5 MG tablet TAKE 1 TABLET BY MOUTH ONCE DAILY  . metFORMIN (GLUCOPHAGE) 1000 MG tablet Take 1,000 mg by mouth 2 (two) times daily.  . Multiple Vitamins-Minerals (CENTRUM SILVER 50+MEN) TABS Take 1 tablet by mouth daily.     Allergies:   Patient has no known allergies.   Social History   Socioeconomic History  . Marital status: Married    Spouse name: Not on file  . Number of children: Not on file  . Years of education: Not on file  . Highest education level: Not on file  Occupational History  . Not on file  Social Needs  . Financial resource strain: Not on file  . Food insecurity:    Worry: Not on file    Inability: Not on file  . Transportation needs:    Medical: Not on file    Non-medical: Not on file  Tobacco Use  . Smoking status: Never  Smoker  . Smokeless tobacco: Never Used  Substance and Sexual Activity  . Alcohol use: No  . Drug use: No  . Sexual activity: Never  Lifestyle  . Physical activity:    Days per week: Not on file    Minutes per session: Not on file  . Stress: Not on file  Relationships  . Social connections:    Talks on phone: Not on file    Gets together: Not on file    Attends religious service: Not on file    Active member of club or organization: Not on file    Attends meetings of clubs or organizations: Not on file    Relationship status: Not on file  Other Topics Concern  . Not on file  Social History Narrative  . Not on file      Family History: The patient's family history includes Cancer in his father; Heart failure in his mother. There is no history of Heart attack or Hypertension.  ROS:   Please see the history of present illness.    No complaints.  All other systems reviewed and are negative.  EKGs/Labs/Other Studies Reviewed:    The following studies were reviewed today: 2D Doppler echocardiogram January 2020 Study Conclusions  - Left ventricle: The cavity size was normal. Wall thickness was   increased in a pattern of mild LVH. There was mild focal basal   hypertrophy of the septum. Systolic function was normal. The   estimated ejection fraction was in the range of 55% to 60%. Wall   motion was normal; there were no regional wall motion   abnormalities. The study is not technically sufficient to allow   evaluation of LV diastolic function. - Ascending aorta: The ascending aorta was mildly dilated. - Mitral valve: Calcified annulus. -Normal left atrial size  Impressions:  - Normal LV systolic function; mild LVH with proximal septal   thickening; mildly dilated ascending aorta (4.3 cm).  EKG:  EKG atrial fibrillation with controlled ventricular response of 71 bpm.  Left axis deviation.  Incomplete right bundle branch block.  When compared to the prior tracing from 2019, atrial fibrillation is new.  Recent Labs: 11/03/2017: ALT 11; TSH 2.640  Recent Lipid Panel    Component Value Date/Time   CHOL 130 07/29/2015 1857   TRIG 61 07/29/2015 1857   HDL 30 (L) 07/29/2015 1857   CHOLHDL 4.3 07/29/2015 1857   VLDL 12 07/29/2015 1857   LDLCALC 88 07/29/2015 1857    Physical Exam:    VS:  BP 118/82   Pulse 71   Ht 5\' 11"  (1.803 m)   Wt 204 lb (92.5 kg)   SpO2 (!) 87%   BMI 28.45 kg/m     Wt Readings from Last 3 Encounters:  05/06/18 204 lb (92.5 kg)  11/03/17 197 lb 6.4 oz (89.5 kg)  01/22/17 211 lb 6.4 oz (95.9 kg)     GEN: Healthy appearing with only mild obesity.. No acute  distress HEENT: Normal NECK: No JVD. LYMPHATICS: No lymphadenopathy CARDIAC: IRRR.  No  murmur, no gallop, no edema VASCULAR: 2+ bilateral carotid and radial pulses, absent carotid bruits RESPIRATORY:  Clear to auscultation without rales, wheezing or rhonchi  ABDOMEN: Soft, non-tender, non-distended, No pulsatile mass, MUSCULOSKELETAL: No deformity  SKIN: Warm and dry NEUROLOGIC:  Alert and oriented x 3 PSYCHIATRIC:  Normal affect   ASSESSMENT:    1. Paroxysmal atrial fibrillation (HCC)   2. On amiodarone therapy   3. Essential hypertension  4. Chronic anticoagulation   5. Chronic diastolic heart failure (HCC)   6. Coronary artery calcification seen on CT scan    PLAN:    In order of problems listed above:  1. Rhythm is irregular EKG demonstrating atrial fib with controlled rate.  Plan to discontinue amiodarone.  Increase carvedilol to 12.5 mg twice daily.  Clinical follow-up in 4 to 6 weeks with EKG and to have a 24-hour Holter started to ensure that rate control is adequate off amiodarone. 2. No evidence of amiodarone toxicity.  Currently only on amiodarone 100 mg/day.  Discontinue amiodarone.  After discussing with the patient, he feels fine in atrial fibrillation and it appears that if we control heart rate he will do well.  Just documented that LV systolic function has improved to the normal range. 3. Excellent control on current therapy.  On the higher dose of carvedilol, will need to make sure blood pressure does not get too low. 4. No bleeding on Eliquis.  Eliquis needs to be continued indefinitely. 5. No evidence of volume overload. 6. Nonobstructive coronary disease at catheterization.  The natural history of atrial fibrillation was discussed.  The inability to cure and the possibility of recurrences was clearly stated.  Management strategies including rhythm control (antiarrhythmic therapy), rate control (beta-blocker therapy or AV node blocking calcium channel blocker  therapy), and ablation and/or pacemaker therapy were reviewed.  Stroke risk (as determined by CHADS VASC score >1) was discussed relative to the patient's individual profile.  The expected duration/permanence of anti-coagulation therapy was determined based on the individual risk score.  Coumadin versus NOAC therapy was reviewed, highlighting the lower bleeding risk and improved safety with NOAC therapy.    After discussion we have decided to go with rate control and discontinuation of antiarrhythmic therapy  6-week follow-up with EKG and to have a 24-hour Holter monitor placed.   Medication Adjustments/Labs and Tests Ordered: Current medicines are reviewed at length with the patient today.  Concerns regarding medicines are outlined above.  No orders of the defined types were placed in this encounter.  No orders of the defined types were placed in this encounter.   There are no Patient Instructions on file for this visit.   Signed, Lesleigh Noe, MD  05/06/2018 10:11 AM    [facility]

## 2018-05-06 ENCOUNTER — Ambulatory Visit (INDEPENDENT_AMBULATORY_CARE_PROVIDER_SITE_OTHER): Payer: Medicare Other | Admitting: Interventional Cardiology

## 2018-05-06 ENCOUNTER — Encounter: Payer: Self-pay | Admitting: Interventional Cardiology

## 2018-05-06 VITALS — BP 118/82 | HR 71 | Ht 71.0 in | Wt 204.0 lb

## 2018-05-06 DIAGNOSIS — I5032 Chronic diastolic (congestive) heart failure: Secondary | ICD-10-CM

## 2018-05-06 DIAGNOSIS — I10 Essential (primary) hypertension: Secondary | ICD-10-CM

## 2018-05-06 DIAGNOSIS — Z79899 Other long term (current) drug therapy: Secondary | ICD-10-CM

## 2018-05-06 DIAGNOSIS — I2510 Atherosclerotic heart disease of native coronary artery without angina pectoris: Secondary | ICD-10-CM | POA: Diagnosis not present

## 2018-05-06 DIAGNOSIS — I480 Paroxysmal atrial fibrillation: Secondary | ICD-10-CM

## 2018-05-06 DIAGNOSIS — Z7901 Long term (current) use of anticoagulants: Secondary | ICD-10-CM | POA: Diagnosis not present

## 2018-05-06 MED ORDER — CARVEDILOL 12.5 MG PO TABS: 12.5000 mg | ORAL_TABLET | Freq: Two times a day (BID) | ORAL | 3 refills | Status: DC

## 2018-05-06 NOTE — Patient Instructions (Signed)
Medication Instructions:  1)DISCONTINUE Amiodarone 2) INCREASE Carvedilol to 12.5mg  twice daily  If you need a refill on your cardiac medications before your next appointment, please call your pharmacy.   Lab work: None If you have labs (blood work) drawn today and your tests are completely normal, you will receive your results only by: Marland Kitchen MyChart Message (if you have MyChart) OR . A paper copy in the mail If you have any lab test that is abnormal or we need to change your treatment, we will call you to review the results.  Testing/Procedures: Your physician has recommended that you wear a 24 hour holter monitor on the same day you see the APP. Holter monitors are medical devices that record the heart's electrical activity. Doctors most often use these monitors to diagnose arrhythmias. Arrhythmias are problems with the speed or rhythm of the heartbeat. The monitor is a small, portable device. You can wear one while you do your normal daily activities. This is usually used to diagnose what is causing palpitations/syncope (passing out).    Follow-Up: Your physician recommends that you schedule a follow-up appointment in: 6 weeks with a PA or NP.  At [facility], you and your health needs are our priority.  As part of our continuing mission to provide you with exceptional heart care, we have created designated Provider Care Teams.  These Care Teams include your primary Cardiologist (physician) and Advanced Practice Providers (APPs -  Physician Assistants and Nurse Practitioners) who all work together to provide you with the care you need, when you need it. You will need a follow up appointment in 12 months.  Please call our office 2 months in advance to schedule this appointment.  You may see Dr. Katrinka Blazing or one of the following Advanced Practice Providers on your designated Care Team:   Norma Fredrickson, NP Nada Boozer, NP . Georgie Chard, NP  Any Other Special Instructions Will Be Listed Below  (If Applicable).

## 2018-05-06 NOTE — Addendum Note (Signed)
Addended by: Julio Sicks on: 05/06/2018 10:30 AM   Modules accepted: Orders

## 2018-05-19 IMAGING — DX DG CHEST 2V
2 series · 2 of 2 positions shown · non-contrast
Comparison: 07/29/2015

CLINICAL DATA: Nausea and chest congestion over the last few days.

EXAM:
CHEST  2 VIEW

[chest pa]
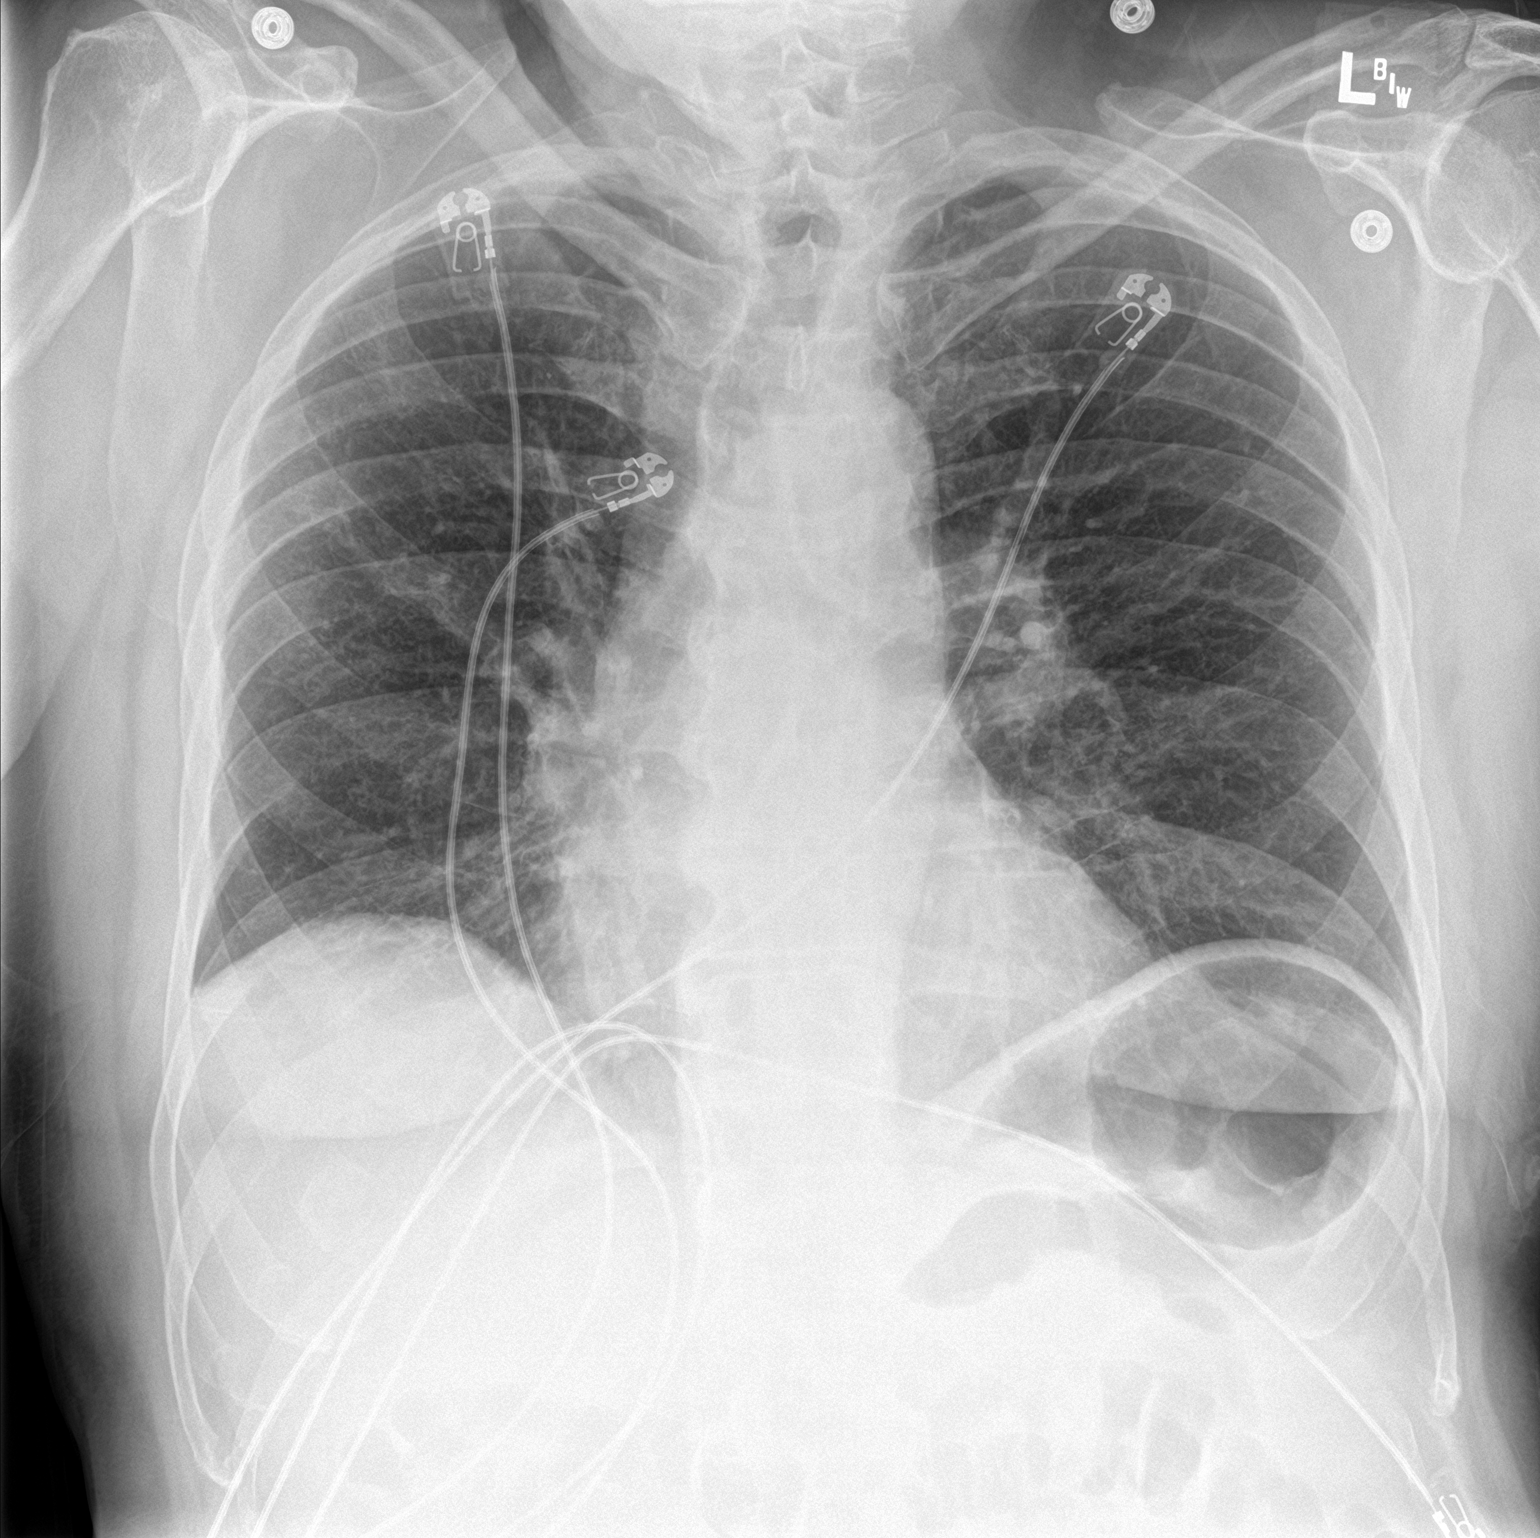

[chest lat]
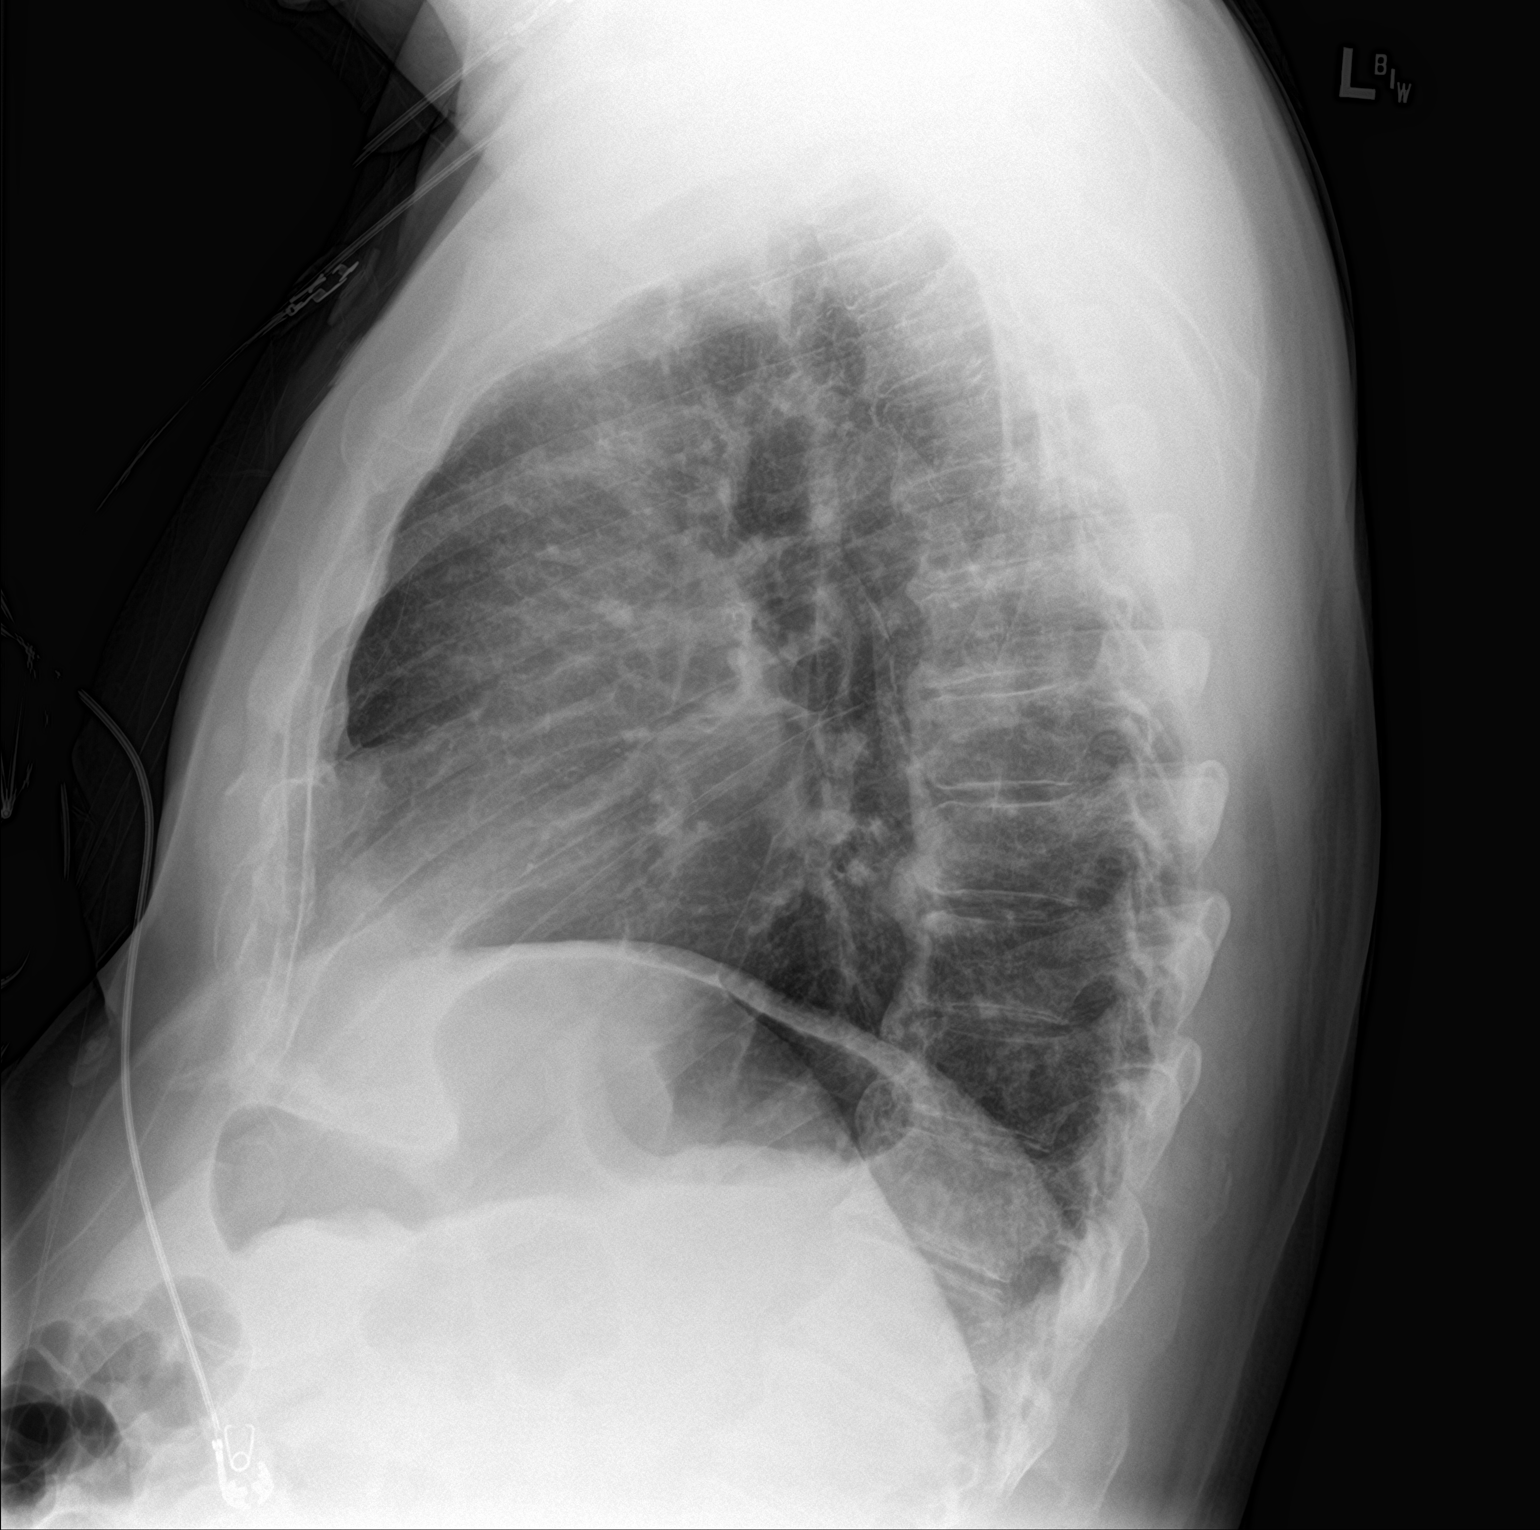

[2 of 2 positions shown; findings below may reference images not displayed]

FINDINGS: Heart size is normal. There is aortic atherosclerosis. The lungs are
clear. No infiltrate, collapse or effusion. Ordinary degenerative
changes affect the spine.
IMPRESSION: No active cardiopulmonary disease.  Aortic atherosclerosis.

## 2018-06-15 DIAGNOSIS — I4891 Unspecified atrial fibrillation: Secondary | ICD-10-CM | POA: Diagnosis not present

## 2018-06-15 DIAGNOSIS — E785 Hyperlipidemia, unspecified: Secondary | ICD-10-CM | POA: Diagnosis not present

## 2018-06-15 DIAGNOSIS — I509 Heart failure, unspecified: Secondary | ICD-10-CM | POA: Diagnosis not present

## 2018-06-15 DIAGNOSIS — E119 Type 2 diabetes mellitus without complications: Secondary | ICD-10-CM | POA: Diagnosis not present

## 2018-06-16 ENCOUNTER — Ambulatory Visit (INDEPENDENT_AMBULATORY_CARE_PROVIDER_SITE_OTHER): Payer: Medicare Other | Admitting: Nurse Practitioner

## 2018-06-16 ENCOUNTER — Encounter: Payer: Self-pay | Admitting: Nurse Practitioner

## 2018-06-16 ENCOUNTER — Telehealth: Payer: Self-pay | Admitting: *Deleted

## 2018-06-16 ENCOUNTER — Other Ambulatory Visit: Payer: Self-pay | Admitting: *Deleted

## 2018-06-16 ENCOUNTER — Ambulatory Visit (INDEPENDENT_AMBULATORY_CARE_PROVIDER_SITE_OTHER): Payer: Medicare Other

## 2018-06-16 VITALS — BP 110/70 | Ht 71.0 in | Wt 208.0 lb

## 2018-06-16 DIAGNOSIS — I4821 Permanent atrial fibrillation: Secondary | ICD-10-CM

## 2018-06-16 DIAGNOSIS — I2510 Atherosclerotic heart disease of native coronary artery without angina pectoris: Secondary | ICD-10-CM

## 2018-06-16 DIAGNOSIS — I480 Paroxysmal atrial fibrillation: Secondary | ICD-10-CM | POA: Diagnosis not present

## 2018-06-16 DIAGNOSIS — Z79899 Other long term (current) drug therapy: Secondary | ICD-10-CM

## 2018-06-16 NOTE — Telephone Encounter (Signed)
S/w Pandora at Landry Mellow, NP office @ [phone] to get faxed copy of pt's cmp from ov.

## 2018-06-16 NOTE — Progress Notes (Signed)
CARDIOLOGY OFFICE NOTE  Date:  06/16/2018    Jason [name] Date of Birth: 1940/11/27 Medical Record #[id number]   PCP:  Dulce Sellar, NP  Cardiologist:  Katrinka Blazing  Chief Complaint  Patient presents with  . Atrial Fibrillation    Follow up visit - seen for Dr. Katrinka Blazing    History of Present Illness: Jason [name] is a 78 y.o. male who presents today for a follow up visit. Seen for Dr. Katrinka Blazing.   He has a history of acute on chronic systolic heart failure (LVEF by echo 20-25%), atrial fibrillation of unknown duration, hypertension, and silent coronary diseasedenotedby calcium on CT scan. Recent cardiac catheterization demonstrated moderate coronary disease and return of LVEF to 55%.  He was last seen by Dr. Katrinka Blazing back this past January - was doing well but noted to be in persistent AF - his amiodarone was stopped - Coreg was increased. He was to come back for EKG and Holter to ensure rate control.   Comes in today. Here alone. He feels ok. Saw his PCP yesterday with labs. There was discussion about cutting Lasix back. He has had recovery of his EF. Not really short of breath. Says he knows he sits too much. No chest pain.  He feels good. No palpitations noted. No awareness of his AF. Tolerating his medicines. He has no real concerns. He is to have a Holter later today. He feels like he is doing well overall. No bleeding/bruising.   Past Medical History:  Diagnosis Date  . Atrial fibrillation (HCC)   . Diabetes mellitus without complication (HCC)   . Hypertension     Past Surgical History:  Procedure Laterality Date  . CARDIAC CATHETERIZATION N/A 11/16/2015   Procedure: Left Heart Cath and Coronary Angiography;  Surgeon: Lyn Records, MD;  Location: [facility] INVASIVE CV LAB;  Service: Cardiovascular;  Laterality: N/A;  . CARDIOVERSION N/A 08/28/2015   Procedure: CARDIOVERSION;  Surgeon: Lewayne Bunting, MD;  Location: [facility] ENDOSCOPY;  Service: Cardiovascular;  Laterality: N/A;  .  HERNIA REPAIR    . KIDNEY STONE SURGERY       Medications: Current Meds  Medication Sig  . acetaminophen (TYLENOL) 325 MG tablet Take 1-2 tablets (325-650 mg total) by mouth every 6 (six) hours as needed for mild pain, moderate pain, fever or headache.  Marland Kitchen apixaban (ELIQUIS) 5 MG TABS tablet Take 1 tablet (5 mg total) by mouth 2 (two) times daily.  Marland Kitchen atorvastatin (LIPITOR) 40 MG tablet TAKE 1 TABLET BY MOUTH ONCE DAILY AT 6PM  . carvedilol (COREG) 12.5 MG tablet Take 1 tablet (12.5 mg total) by mouth 2 (two) times daily.  . cetirizine (ZYRTEC) 10 MG tablet Take 10 mg by mouth daily as needed for allergies.  . furosemide (LASIX) 40 MG tablet TAKE 1 TABLET BY MOUTH ONCE DAILY  . glipiZIDE (GLUCOTROL XL) 2.5 MG 24 hr tablet Take 2.5 mg by mouth daily.  Marland Kitchen lisinopril (PRINIVIL,ZESTRIL) 5 MG tablet TAKE 1 TABLET BY MOUTH ONCE DAILY  . metFORMIN (GLUCOPHAGE) 1000 MG tablet Take 1,000 mg by mouth 2 (two) times daily.  . Multiple Vitamins-Minerals (CENTRUM SILVER 50+MEN) TABS Take 1 tablet by mouth daily.     Allergies: No Known Allergies  Social History: The patient  reports that he has never smoked. He has never used smokeless tobacco. He reports that he does not drink alcohol or use drugs.   Family History: The patient's family history includes Cancer in his father; Heart failure  in his mother.   Review of Systems: Please see the history of present illness.   Otherwise, the review of systems is positive for none.   All other systems are reviewed and negative.   Physical Exam: VS:  BP 110/70   Ht 5\' 11"  (1.803 m)   Wt 208 lb (94.3 kg)   BMI 29.01 kg/m  .  BMI Body mass index is 29.01 kg/m.  Wt Readings from Last 3 Encounters:  06/16/18 208 lb (94.3 kg)  05/06/18 204 lb (92.5 kg)  11/03/17 197 lb 6.4 oz (89.5 kg)    General: Pleasant. Elderly male. Alert and in no acute distress.   HEENT: Normal.  Neck: Supple, no JVD, carotid bruits, or masses noted.  Cardiac: Irregular  irregular rhythm. Rate a little fast initially - better by EKG. No edema.  Respiratory:  Lungs are clear to auscultation bilaterally with normal work of breathing.  GI: Soft and nontender.  MS: No deformity or atrophy. Gait and ROM intact.  Skin: Warm and dry. Color is normal.  Neuro:  Strength and sensation are intact and no gross focal deficits noted.  Psych: Alert, appropriate and with normal affect.   LABORATORY DATA:  EKG:  EKG is ordered today. This shows AF with VR of 97.   Lab Results  Component Value Date   WBC 21.1 (H) 07/31/2016   HGB 11.8 (L) 07/31/2016   HCT 35.7 (L) 07/31/2016   PLT 186 07/31/2016   GLUCOSE 141 (H) 07/31/2016   CHOL 130 07/29/2015   TRIG 61 07/29/2015   HDL 30 (L) 07/29/2015   LDLCALC 88 07/29/2015   ALT 11 11/03/2017   AST 18 11/03/2017   NA 137 07/31/2016   K 3.9 07/31/2016   CL 104 07/31/2016   CREATININE 1.21 07/31/2016   BUN 20 07/31/2016   CO2 25 07/31/2016   TSH 2.640 11/03/2017   INR 1.30 07/29/2015   HGBA1C 7.2 (H) 07/29/2015       BNP (last 3 results) No results for input(s): BNP in the last 8760 hours.  ProBNP (last 3 results) No results for input(s): PROBNP in the last 8760 hours.   Other Studies Reviewed Today:  2D Doppler echocardiogram January 2020 Study Conclusions  - Left ventricle: The cavity size was normal. Wall thickness was increased in a pattern of mild LVH. There was mild focal basal hypertrophy of the septum. Systolic function was normal. The estimated ejection fraction was in the range of 55% to 60%. Wall motion was normal; there were no regional wall motion abnormalities. The study is not technically sufficient to allow evaluation of LV diastolic function. - Ascending aorta: The ascending aorta was mildly dilated. - Mitral valve: Calcified annulus. -Normal left atrial size  Impressions:  - Normal LV systolic function; mild LVH with proximal septal thickening; mildly dilated  ascending aorta (4.3 cm).  Left Heart Cath and Coronary Angiography 11/2015  Conclusion     Prox LAD lesion, 65 %stenosed.  Mid RCA lesion, 30 %stenosed.  1st Mrg lesion, 75 %stenosed.  The left ventricular systolic function is normal.  LV end diastolic pressure is mildly elevated.  The left ventricular ejection fraction is 55-65% by visual estimate.    Noncritical coronary atherosclerosis with 60-70% calcified eccentric mid LAD stenosis. 75% stenosis in the midportion of a small first obtuse marginal, and 30% mid RCA.  Normal left ventricular function. Elevated left ventricular end-diastolic pressure compatible with diastolic dysfunction. EF 55-60%.  Apical ischemia felt secondary to borderline significant  mid LAD. In absence of symptoms intensification of medical therapy seems most prudent approach.  RECOMMENDATIONS:  With improved LV function, further intensification of beta blocker therapy would be helpful for ischemic heart disease noted above. ACE inhibitor therapy is not mandatory.  Antiplatelet therapy.  Goal will be eventual discontinuation of amiodarone if no recurrences of atrial fib.  Long-term anticoagulation therapy with Eliquis may probably be necessary but will depend upon course and whether there is recurrent atrial fibrillation.  Aggressive risk factor modification.    Assessment/Plan:  1. Persistent AF - BP little soft - HR not idea - but has not had 2nd dose of Coreg for today yet - will proceed with Holter - if not ideal rate control, would cut Lasix back to increase Coreg without further BP lowering. Further disposition to follow.   2. CAD -  No active symptoms.   3. HTN - BP soft - see #1.   4. Chronic anticoagulation - no problems. Unclear if he has had recent lab - will call - if not - can do tomorrow when he returns the [place].   5. Chronic diastolic CHF/prior systolic HF - EF has recovered. He looks good. Weight has tended to creep up over  the past 9 months - he notes more inactivity. No worsening of symptoms noted.   Current medicines are reviewed with the patient today.  The patient does not have concerns regarding medicines other than what has been noted above.  The following changes have been made:  See above.  Labs/ tests ordered today include:    Orders Placed This Encounter  Procedures  . EKG 12-Lead     Disposition:   FU with Dr. Katrinka Blazing as planned. Holter pending. I am happy to see back as needed.    Patient is agreeable to this plan and will call if any problems develop in the interim.   SignedNorma Fredrickson, NP  06/16/2018 3:50 PM  [facility] [address] [place], [place]  [address] Phone: [phone] Fax: [phone]

## 2018-06-16 NOTE — Addendum Note (Signed)
Addended by: Tonita Phoenix on: 06/16/2018 04:15 PM   Modules accepted: Orders

## 2018-06-16 NOTE — Patient Instructions (Addendum)
We will be checking the following labs today - NONE  We will call about getting a copy of your labs from yesterday. If you have not had what we need to see - we will get lab here tomorrow.    Medication Instructions:    Continue with your current medicines for now. We may need to make changes but let's see what the monitor shows first.    If you need a refill on your cardiac medications before your next appointment, please call your pharmacy.     Testing/Procedures To Be Arranged:  N/A  Follow-Up:   See Dr. Katrinka Blazing back as planned.     At [facility], you and your health needs are our priority.  As part of our continuing mission to provide you with exceptional heart care, we have created designated Provider Care Teams.  These Care Teams include your primary Cardiologist (physician) and Advanced Practice Providers (APPs -  Physician Assistants and Nurse Practitioners) who all work together to provide you with the care you need, when you need it.  Special Instructions:  . None  Call the [facility] office at [phone] if you have any questions, problems or concerns.

## 2018-06-17 LAB — CBC WITH DIFFERENTIAL/PLATELET
Basophils Absolute: 0.1 10*3/uL (ref 0.0–0.2)
Basos: 1 %
EOS (ABSOLUTE): 0.5 10*3/uL — ABNORMAL HIGH (ref 0.0–0.4)
Eos: 6 %
Hematocrit: 43.8 % (ref 37.5–51.0)
Hemoglobin: 14.7 g/dL (ref 13.0–17.7)
Immature Grans (Abs): 0 10*3/uL (ref 0.0–0.1)
Immature Granulocytes: 0 %
Lymphocytes Absolute: 3.8 10*3/uL — ABNORMAL HIGH (ref 0.7–3.1)
Lymphs: 41 %
MCH: 30.2 pg (ref 26.6–33.0)
MCHC: 33.6 g/dL (ref 31.5–35.7)
MCV: 90 fL (ref 79–97)
Monocytes Absolute: 0.7 10*3/uL (ref 0.1–0.9)
Monocytes: 8 %
Neutrophils Absolute: 4.1 10*3/uL (ref 1.4–7.0)
Neutrophils: 44 %
Platelets: 240 10*3/uL (ref 150–450)
RBC: 4.87 x10E6/uL (ref 4.14–5.80)
RDW: 12.7 % (ref 11.6–15.4)
WBC: 9.3 10*3/uL (ref 3.4–10.8)

## 2018-06-21 DIAGNOSIS — H40003 Preglaucoma, unspecified, bilateral: Secondary | ICD-10-CM | POA: Diagnosis not present

## 2018-09-15 DIAGNOSIS — H34812 Central retinal vein occlusion, left eye, with macular edema: Secondary | ICD-10-CM | POA: Diagnosis not present

## 2018-09-22 DIAGNOSIS — Z1331 Encounter for screening for depression: Secondary | ICD-10-CM | POA: Diagnosis not present

## 2018-09-22 DIAGNOSIS — E785 Hyperlipidemia, unspecified: Secondary | ICD-10-CM | POA: Diagnosis not present

## 2018-09-22 DIAGNOSIS — E119 Type 2 diabetes mellitus without complications: Secondary | ICD-10-CM | POA: Diagnosis not present

## 2018-09-22 DIAGNOSIS — I4891 Unspecified atrial fibrillation: Secondary | ICD-10-CM | POA: Diagnosis not present

## 2018-09-22 DIAGNOSIS — Z9181 History of falling: Secondary | ICD-10-CM | POA: Diagnosis not present

## 2018-09-22 DIAGNOSIS — I509 Heart failure, unspecified: Secondary | ICD-10-CM | POA: Diagnosis not present

## 2018-09-27 ENCOUNTER — Other Ambulatory Visit: Payer: Self-pay | Admitting: Interventional Cardiology

## 2018-10-29 ENCOUNTER — Other Ambulatory Visit: Payer: Self-pay | Admitting: Physician Assistant

## 2018-11-02 DIAGNOSIS — H3562 Retinal hemorrhage, left eye: Secondary | ICD-10-CM | POA: Diagnosis not present

## 2018-11-02 DIAGNOSIS — H43813 Vitreous degeneration, bilateral: Secondary | ICD-10-CM | POA: Diagnosis not present

## 2018-11-02 DIAGNOSIS — H34812 Central retinal vein occlusion, left eye, with macular edema: Secondary | ICD-10-CM | POA: Diagnosis not present

## 2018-11-18 DIAGNOSIS — H34812 Central retinal vein occlusion, left eye, with macular edema: Secondary | ICD-10-CM | POA: Diagnosis not present

## 2018-11-22 ENCOUNTER — Other Ambulatory Visit: Payer: Self-pay | Admitting: Physician Assistant

## 2018-12-17 DIAGNOSIS — H43813 Vitreous degeneration, bilateral: Secondary | ICD-10-CM | POA: Diagnosis not present

## 2018-12-17 DIAGNOSIS — H34812 Central retinal vein occlusion, left eye, with macular edema: Secondary | ICD-10-CM | POA: Diagnosis not present

## 2018-12-17 DIAGNOSIS — H3562 Retinal hemorrhage, left eye: Secondary | ICD-10-CM | POA: Diagnosis not present

## 2018-12-26 DIAGNOSIS — S8992XA Unspecified injury of left lower leg, initial encounter: Secondary | ICD-10-CM | POA: Diagnosis not present

## 2018-12-26 DIAGNOSIS — S0101XA Laceration without foreign body of scalp, initial encounter: Secondary | ICD-10-CM | POA: Diagnosis not present

## 2018-12-26 DIAGNOSIS — S0990XA Unspecified injury of head, initial encounter: Secondary | ICD-10-CM | POA: Diagnosis not present

## 2018-12-26 DIAGNOSIS — S8991XA Unspecified injury of right lower leg, initial encounter: Secondary | ICD-10-CM | POA: Diagnosis not present

## 2018-12-26 DIAGNOSIS — S0081XA Abrasion of other part of head, initial encounter: Secondary | ICD-10-CM | POA: Diagnosis not present

## 2018-12-26 DIAGNOSIS — S8990XA Unspecified injury of unspecified lower leg, initial encounter: Secondary | ICD-10-CM | POA: Diagnosis not present

## 2018-12-26 DIAGNOSIS — T148XXA Other injury of unspecified body region, initial encounter: Secondary | ICD-10-CM | POA: Diagnosis not present

## 2018-12-26 DIAGNOSIS — S199XXA Unspecified injury of neck, initial encounter: Secondary | ICD-10-CM | POA: Diagnosis not present

## 2018-12-26 DIAGNOSIS — R42 Dizziness and giddiness: Secondary | ICD-10-CM | POA: Diagnosis not present

## 2019-01-05 DIAGNOSIS — Z6829 Body mass index (BMI) 29.0-29.9, adult: Secondary | ICD-10-CM | POA: Diagnosis not present

## 2019-01-05 DIAGNOSIS — S0191XA Laceration without foreign body of unspecified part of head, initial encounter: Secondary | ICD-10-CM | POA: Diagnosis not present

## 2019-01-05 DIAGNOSIS — Z09 Encounter for follow-up examination after completed treatment for conditions other than malignant neoplasm: Secondary | ICD-10-CM | POA: Diagnosis not present

## 2019-01-05 DIAGNOSIS — Z23 Encounter for immunization: Secondary | ICD-10-CM | POA: Diagnosis not present

## 2019-01-05 DIAGNOSIS — W19XXXA Unspecified fall, initial encounter: Secondary | ICD-10-CM | POA: Diagnosis not present

## 2019-01-21 DIAGNOSIS — E119 Type 2 diabetes mellitus without complications: Secondary | ICD-10-CM | POA: Diagnosis not present

## 2019-01-21 DIAGNOSIS — I509 Heart failure, unspecified: Secondary | ICD-10-CM | POA: Diagnosis not present

## 2019-01-21 DIAGNOSIS — I4891 Unspecified atrial fibrillation: Secondary | ICD-10-CM | POA: Diagnosis not present

## 2019-01-21 DIAGNOSIS — E1169 Type 2 diabetes mellitus with other specified complication: Secondary | ICD-10-CM | POA: Diagnosis not present

## 2019-03-17 ENCOUNTER — Telehealth: Payer: Self-pay

## 2019-03-17 NOTE — Telephone Encounter (Signed)
**Note De-Identified  Obfuscation** The pt left his BMS pt asst application for Eliquis at the office. I have completed the provider part of the application and am awaiting MDs signature.

## 2019-03-18 NOTE — Telephone Encounter (Signed)
**Note De-Identified  Obfuscation** Dr Tamala Julian has signed the pts BMS pt asst application and I have faxed all to BMS.

## 2019-03-29 ENCOUNTER — Telehealth: Payer: Self-pay | Admitting: Interventional Cardiology

## 2019-03-29 NOTE — Telephone Encounter (Signed)
Max from [employer] states that they are missing both the patient and provider application. You can fax to [phone].

## 2019-03-29 NOTE — Telephone Encounter (Signed)
Was able to locate paperwork.  Faxed to number provided.

## 2019-04-25 NOTE — Telephone Encounter (Signed)
**Note De-Identified  Obfuscation** Letter received from [employer] stating that they have approved the pt for asst with his Eliquis. Approval good until 04/13/20 Application case#: [id number]

## 2019-05-01 DIAGNOSIS — M25562 Pain in left knee: Secondary | ICD-10-CM | POA: Diagnosis not present

## 2019-05-14 ENCOUNTER — Other Ambulatory Visit: Payer: Self-pay | Admitting: Interventional Cardiology

## 2019-05-23 ENCOUNTER — Other Ambulatory Visit: Payer: Self-pay | Admitting: Physician Assistant

## 2019-05-24 DIAGNOSIS — E1169 Type 2 diabetes mellitus with other specified complication: Secondary | ICD-10-CM | POA: Diagnosis not present

## 2019-05-24 DIAGNOSIS — I4891 Unspecified atrial fibrillation: Secondary | ICD-10-CM | POA: Diagnosis not present

## 2019-05-24 DIAGNOSIS — Z139 Encounter for screening, unspecified: Secondary | ICD-10-CM | POA: Diagnosis not present

## 2019-05-24 DIAGNOSIS — I509 Heart failure, unspecified: Secondary | ICD-10-CM | POA: Diagnosis not present

## 2019-05-24 DIAGNOSIS — E785 Hyperlipidemia, unspecified: Secondary | ICD-10-CM | POA: Diagnosis not present

## 2019-06-06 NOTE — Progress Notes (Signed)
Cardiology Office Note:    Date:  06/07/2019   ID:  Jason [name], DOB 02-Apr-1941, MRN [id number]  PCP:  Jeanie Sewer, NP  Cardiologist:  Sinclair Grooms, MD   Referring MD: Jeanie Sewer, NP   Chief Complaint  Patient presents with  . Atrial Fibrillation    History of Present Illness:    Jason [name] is a 79 y.o. male with a hx of acute on chronic systolic heart failure (LVEF by echo 20-25%), atrial fibrillation of unknown duration, hypertension, and silent coronary diseasedenotedby calcium on CT scan. Recent cardiac catheterization demonstrated moderate coronary disease and return of LVEF to 55%.  He has no complaints.  He is here because this is unassigned 1 year follow-up.  He takes care of his wife who has dementia.  He does yard work and housework.  He is able to climb stairs.  He has no limitations with ambulation.  He sleeps well, has not noted fluid retention, and denies orthopnea.  Past Medical History:  Diagnosis Date  . Atrial fibrillation ([place])   . Diabetes mellitus without complication ([place])   . Hypertension     Past Surgical History:  Procedure Laterality Date  . CARDIAC CATHETERIZATION N/A 11/16/2015   Procedure: Left Heart Cath and Coronary Angiography;  Surgeon: Belva Crome, MD;  Location: [place] CV LAB;  Service: Cardiovascular;  Laterality: N/A;  . CARDIOVERSION N/A 08/28/2015   Procedure: CARDIOVERSION;  Surgeon: Lelon Perla, MD;  Location: [facility] ENDOSCOPY;  Service: Cardiovascular;  Laterality: N/A;  . HERNIA REPAIR    . KIDNEY STONE SURGERY      Current Medications: Current Meds  Medication Sig  . acetaminophen (TYLENOL) 325 MG tablet Take 1-2 tablets (325-650 mg total) by mouth every 6 (six) hours as needed for mild pain, moderate pain, fever or headache.  Marland Kitchen apixaban (ELIQUIS) 5 MG TABS tablet Take 1 tablet (5 mg total) by mouth 2 (two) times daily.  Marland Kitchen atorvastatin (LIPITOR) 40 MG tablet TAKE 1 TABLET BY MOUTH ONCE DAILY AT  6   PM  . carvedilol (COREG) 12.5 MG tablet Take 1 tablet by mouth twice daily  . cetirizine (ZYRTEC) 10 MG tablet Take 10 mg by mouth daily as needed for allergies.  . furosemide (LASIX) 40 MG tablet Take 1 tablet by mouth once daily  . glipiZIDE (GLUCOTROL XL) 2.5 MG 24 hr tablet Take 2.5 mg by mouth daily.  Marland Kitchen lisinopril (ZESTRIL) 5 MG tablet Take 1 tablet by mouth once daily  . metFORMIN (GLUCOPHAGE) 1000 MG tablet Take 1,000 mg by mouth 2 (two) times daily.  . Multiple Vitamins-Minerals (CENTRUM SILVER 50+MEN) TABS Take 1 tablet by mouth daily.     Allergies:   Patient has no known allergies.   Social History   Socioeconomic History  . Marital status: Married    Spouse name: Not on file  . Number of children: Not on file  . Years of education: Not on file  . Highest education level: Not on file  Occupational History  . Not on file  Tobacco Use  . Smoking status: Never Smoker  . Smokeless tobacco: Never Used  Substance and Sexual Activity  . Alcohol use: No  . Drug use: No  . Sexual activity: Never  Other Topics Concern  . Not on file  Social History Narrative  . Not on file   Social Determinants of Health   Financial Resource Strain:   . Difficulty of Paying Living Expenses: Not on  file  Food Insecurity:   . Worried About Programme researcher, broadcasting/film/video in the Last Year: Not on file  . Ran Out of Food in the Last Year: Not on file  Transportation Needs:   . Lack of Transportation (Medical): Not on file  . Lack of Transportation (Non-Medical): Not on file  Physical Activity:   . Days of Exercise per Week: Not on file  . Minutes of Exercise per Session: Not on file  Stress:   . Feeling of Stress : Not on file  Social Connections:   . Frequency of Communication with Friends and Family: Not on file  . Frequency of Social Gatherings with Friends and Family: Not on file  . Attends Religious Services: Not on file  . Active Member of Clubs or Organizations: Not on file  . Attends  Banker Meetings: Not on file  . Marital Status: Not on file     Family History: The patient's family history includes Cancer in his father; Heart failure in his mother. There is no history of Heart attack or Hypertension.  ROS:   Please see the history of present illness.    No specific complaints.  No dizziness or syncopal episodes.  No blood loss or bleeding on Eliquis.  He has not had transient neurological complaints.  All other systems reviewed and are negative.  EKGs/Labs/Other Studies Reviewed:    The following studies were reviewed today: None  EKG:  EKG atrial fibrillation, rapid ventricular response at 108 bpm.  Leftward axis deviation with right bundle branch block..  Slightly faster than last heart rate 1 year ago.  Recent Labs: 06/16/2018: Hemoglobin 14.7; Platelets 240  Recent Lipid Panel    Component Value Date/Time   CHOL 130 07/29/2015 1857   TRIG 61 07/29/2015 1857   HDL 30 (L) 07/29/2015 1857   CHOLHDL 4.3 07/29/2015 1857   VLDL 12 07/29/2015 1857   LDLCALC 88 07/29/2015 1857    Physical Exam:    VS:  BP 98/64   Pulse (!) 108   Ht 5\' 11"  (1.803 m)   Wt 201 lb (91.2 kg)   SpO2 98%   BMI 28.03 kg/m     Wt Readings from Last 3 Encounters:  06/07/19 201 lb (91.2 kg)  06/16/18 208 lb (94.3 kg)  05/06/18 204 lb (92.5 kg)     GEN: Healthy and compensated appearing. No acute distress HEENT: Normal NECK: No JVD. LYMPHATICS: No lymphadenopathy CARDIAC: IIRR without murmur, gallop, or edema. VASCULAR:  Normal Pulses. No bruits. RESPIRATORY:  Clear to auscultation without rales, wheezing or rhonchi  ABDOMEN: Soft, non-tender, non-distended, No pulsatile mass, MUSCULOSKELETAL: No deformity  SKIN: Warm and dry NEUROLOGIC:  Alert and oriented x 3 PSYCHIATRIC:  Normal affect   ASSESSMENT:    1. Paroxysmal atrial fibrillation (HCC)   2. Essential hypertension   3. Chronic anticoagulation   4. Chronic diastolic heart failure (HCC)   5.  Coronary artery calcification seen on CT scan   6. Cirrhosis of liver without ascites, unspecified hepatic cirrhosis type (HCC)   7. Educated about COVID-19 virus infection    PLAN:    In order of problems listed above:  1. Rate is slightly faster than before.  Contemplated adding low-dose digoxin but decided against since the patient is feeling well.  He will notify 05/08/18 if shortness of breath or issues. 2. Continue current therapy with carvedilol.  Blood pressure is lowish and therefore up titration of therapy is not prudent. 3. Continue  apixaban 2.5 mg twice daily and continue to monitor for evidence of bleeding. 4. No evidence of volume overload 5. No symptoms of angina 6. Continue 3W's and continue to wear mask, wash hands, and socially distance.   Medication Adjustments/Labs and Tests Ordered: Current medicines are reviewed at length with the patient today.  Concerns regarding medicines are outlined above.  Orders Placed This Encounter  Procedures  . EKG 12-Lead   No orders of the defined types were placed in this encounter.   Patient Instructions  Medication Instructions:  Your physician recommends that you continue on your current medications as directed. Please refer to the Current Medication list given to you today.  *If you need a refill on your cardiac medications before your next appointment, please call your pharmacy*  Lab Work: None If you have labs (blood work) drawn today and your tests are completely normal, you will receive your results only by: Marland Kitchen MyChart Message (if you have MyChart) OR . A paper copy in the mail If you have any lab test that is abnormal or we need to change your treatment, we will call you to review the results.  Testing/Procedures: None  Follow-Up: At [facility], you and your health needs are our priority.  As part of our continuing mission to provide you with exceptional heart care, we have created designated Provider Care Teams.   These Care Teams include your primary Cardiologist (physician) and Advanced Practice Providers (APPs -  Physician Assistants and Nurse Practitioners) who all work together to provide you with the care you need, when you need it.  Your next appointment:   12 month(s)  The format for your next appointment:   In Person  Provider:   You may see Lesleigh Noe, MD or one of the following Advanced Practice Providers on your designated Care Team:    Norma Fredrickson, NP  Nada Boozer, NP  Georgie Chard, NP   Other Instructions      Signed, Lesleigh Noe, MD  06/07/2019 5:31 PM    [facility]

## 2019-06-07 ENCOUNTER — Encounter: Payer: Self-pay | Admitting: Interventional Cardiology

## 2019-06-07 ENCOUNTER — Other Ambulatory Visit: Payer: Self-pay

## 2019-06-07 ENCOUNTER — Ambulatory Visit (INDEPENDENT_AMBULATORY_CARE_PROVIDER_SITE_OTHER): Payer: Medicare Other | Admitting: Interventional Cardiology

## 2019-06-07 VITALS — BP 98/64 | HR 108 | Ht 71.0 in | Wt 201.0 lb

## 2019-06-07 DIAGNOSIS — I10 Essential (primary) hypertension: Secondary | ICD-10-CM | POA: Diagnosis not present

## 2019-06-07 DIAGNOSIS — Z7189 Other specified counseling: Secondary | ICD-10-CM

## 2019-06-07 DIAGNOSIS — Z7901 Long term (current) use of anticoagulants: Secondary | ICD-10-CM

## 2019-06-07 DIAGNOSIS — I5032 Chronic diastolic (congestive) heart failure: Secondary | ICD-10-CM

## 2019-06-07 DIAGNOSIS — I480 Paroxysmal atrial fibrillation: Secondary | ICD-10-CM

## 2019-06-07 DIAGNOSIS — I2510 Atherosclerotic heart disease of native coronary artery without angina pectoris: Secondary | ICD-10-CM

## 2019-06-07 NOTE — Patient Instructions (Signed)

## 2019-06-19 ENCOUNTER — Other Ambulatory Visit: Payer: Self-pay | Admitting: Interventional Cardiology

## 2019-08-16 ENCOUNTER — Other Ambulatory Visit: Payer: Self-pay | Admitting: Interventional Cardiology

## 2019-08-17 ENCOUNTER — Other Ambulatory Visit: Payer: Self-pay | Admitting: Interventional Cardiology

## 2019-09-21 DIAGNOSIS — E1169 Type 2 diabetes mellitus with other specified complication: Secondary | ICD-10-CM | POA: Diagnosis not present

## 2019-09-21 DIAGNOSIS — E785 Hyperlipidemia, unspecified: Secondary | ICD-10-CM | POA: Diagnosis not present

## 2019-09-21 DIAGNOSIS — I4891 Unspecified atrial fibrillation: Secondary | ICD-10-CM | POA: Diagnosis not present

## 2019-09-21 DIAGNOSIS — Z139 Encounter for screening, unspecified: Secondary | ICD-10-CM | POA: Diagnosis not present

## 2019-09-21 DIAGNOSIS — I509 Heart failure, unspecified: Secondary | ICD-10-CM | POA: Diagnosis not present

## 2019-11-08 ENCOUNTER — Other Ambulatory Visit: Payer: Self-pay | Admitting: Nurse Practitioner

## 2019-12-20 DIAGNOSIS — E785 Hyperlipidemia, unspecified: Secondary | ICD-10-CM | POA: Diagnosis not present

## 2019-12-20 DIAGNOSIS — E1169 Type 2 diabetes mellitus with other specified complication: Secondary | ICD-10-CM | POA: Diagnosis not present

## 2019-12-20 DIAGNOSIS — M109 Gout, unspecified: Secondary | ICD-10-CM | POA: Diagnosis not present

## 2019-12-20 DIAGNOSIS — I4891 Unspecified atrial fibrillation: Secondary | ICD-10-CM | POA: Diagnosis not present

## 2019-12-20 DIAGNOSIS — I509 Heart failure, unspecified: Secondary | ICD-10-CM | POA: Diagnosis not present

## 2019-12-20 DIAGNOSIS — Z139 Encounter for screening, unspecified: Secondary | ICD-10-CM | POA: Diagnosis not present

## 2019-12-22 DIAGNOSIS — H35013 Changes in retinal vascular appearance, bilateral: Secondary | ICD-10-CM | POA: Diagnosis not present

## 2019-12-22 DIAGNOSIS — H43392 Other vitreous opacities, left eye: Secondary | ICD-10-CM | POA: Diagnosis not present

## 2019-12-22 DIAGNOSIS — H34812 Central retinal vein occlusion, left eye, with macular edema: Secondary | ICD-10-CM | POA: Diagnosis not present

## 2019-12-22 DIAGNOSIS — E113292 Type 2 diabetes mellitus with mild nonproliferative diabetic retinopathy without macular edema, left eye: Secondary | ICD-10-CM | POA: Diagnosis not present

## 2020-01-06 DIAGNOSIS — H43813 Vitreous degeneration, bilateral: Secondary | ICD-10-CM | POA: Diagnosis not present

## 2020-01-06 DIAGNOSIS — H34812 Central retinal vein occlusion, left eye, with macular edema: Secondary | ICD-10-CM | POA: Diagnosis not present

## 2020-01-27 DIAGNOSIS — Z23 Encounter for immunization: Secondary | ICD-10-CM | POA: Diagnosis not present

## 2020-01-31 DIAGNOSIS — H34812 Central retinal vein occlusion, left eye, with macular edema: Secondary | ICD-10-CM | POA: Diagnosis not present

## 2020-01-31 DIAGNOSIS — H43813 Vitreous degeneration, bilateral: Secondary | ICD-10-CM | POA: Diagnosis not present

## 2020-01-31 DIAGNOSIS — E119 Type 2 diabetes mellitus without complications: Secondary | ICD-10-CM | POA: Diagnosis not present

## 2020-02-21 DIAGNOSIS — M1712 Unilateral primary osteoarthritis, left knee: Secondary | ICD-10-CM | POA: Diagnosis not present

## 2020-02-21 DIAGNOSIS — M25521 Pain in right elbow: Secondary | ICD-10-CM | POA: Diagnosis not present

## 2020-03-12 DIAGNOSIS — Z23 Encounter for immunization: Secondary | ICD-10-CM | POA: Diagnosis not present

## 2020-04-10 DIAGNOSIS — H3562 Retinal hemorrhage, left eye: Secondary | ICD-10-CM | POA: Diagnosis not present

## 2020-04-10 DIAGNOSIS — H43813 Vitreous degeneration, bilateral: Secondary | ICD-10-CM | POA: Diagnosis not present

## 2020-04-10 DIAGNOSIS — H34812 Central retinal vein occlusion, left eye, with macular edema: Secondary | ICD-10-CM | POA: Diagnosis not present

## 2020-04-11 DIAGNOSIS — I4891 Unspecified atrial fibrillation: Secondary | ICD-10-CM | POA: Diagnosis not present

## 2020-04-11 DIAGNOSIS — Z6827 Body mass index (BMI) 27.0-27.9, adult: Secondary | ICD-10-CM | POA: Diagnosis not present

## 2020-04-11 DIAGNOSIS — E785 Hyperlipidemia, unspecified: Secondary | ICD-10-CM | POA: Diagnosis not present

## 2020-04-11 DIAGNOSIS — I509 Heart failure, unspecified: Secondary | ICD-10-CM | POA: Diagnosis not present

## 2020-04-11 DIAGNOSIS — E1169 Type 2 diabetes mellitus with other specified complication: Secondary | ICD-10-CM | POA: Diagnosis not present

## 2020-04-16 ENCOUNTER — Other Ambulatory Visit: Payer: Self-pay | Admitting: Pharmacist

## 2020-04-16 MED ORDER — APIXABAN 5 MG PO TABS: 5.0000 mg | ORAL_TABLET | Freq: Two times a day (BID) | ORAL | 1 refills | Status: DC

## 2020-04-16 NOTE — Progress Notes (Signed)
Received refill request from [facility] pharmacy for Eliquis Age 80, weight 91kg, SCr 1.17 at [place] on 12/20/19 Last visit Feb 2021, afib indication Refill sent in

## 2020-04-18 ENCOUNTER — Other Ambulatory Visit: Payer: Self-pay | Admitting: *Deleted

## 2020-04-18 MED ORDER — APIXABAN 5 MG PO TABS: 5.0000 mg | ORAL_TABLET | Freq: Two times a day (BID) | ORAL | 1 refills | Status: DC

## 2020-04-18 NOTE — Addendum Note (Signed)
Addended by: Cory Roughen on: 04/18/2020 08:49 AM   Modules accepted: Orders

## 2020-04-18 NOTE — Telephone Encounter (Addendum)
Prescription refill request for Eliquis received from [facility] pharmacy  Indication:Afib Last office visit: Katrinka Blazing 06/07/2019 Scr: 1.17, 12/20/2019 Age:80 yo  Weight: 91.2 kg   Prescription refill sent.

## 2020-04-20 ENCOUNTER — Telehealth: Payer: Self-pay

## 2020-04-20 NOTE — Telephone Encounter (Signed)
**Note De-Identified  Obfuscation** A completed BMSPAF application with documents was left at the office for this pt. I have completed the provider page of the application and emailed all to Dr Lonn Georgia nurse so she can obtain his signature, date it and to fax all to [facility] at fax number written on cover letter included.

## 2020-04-20 NOTE — Telephone Encounter (Signed)
Paperwork completed and faxed.  °

## 2020-05-11 ENCOUNTER — Other Ambulatory Visit: Payer: Self-pay | Admitting: Interventional Cardiology

## 2020-06-05 DIAGNOSIS — H43813 Vitreous degeneration, bilateral: Secondary | ICD-10-CM | POA: Diagnosis not present

## 2020-06-05 DIAGNOSIS — H34812 Central retinal vein occlusion, left eye, with macular edema: Secondary | ICD-10-CM | POA: Diagnosis not present

## 2020-06-05 DIAGNOSIS — H3562 Retinal hemorrhage, left eye: Secondary | ICD-10-CM | POA: Diagnosis not present

## 2020-06-14 DIAGNOSIS — M1711 Unilateral primary osteoarthritis, right knee: Secondary | ICD-10-CM | POA: Diagnosis not present

## 2020-06-22 ENCOUNTER — Other Ambulatory Visit: Payer: Self-pay | Admitting: Interventional Cardiology

## 2020-07-04 DIAGNOSIS — E113293 Type 2 diabetes mellitus with mild nonproliferative diabetic retinopathy without macular edema, bilateral: Secondary | ICD-10-CM | POA: Diagnosis not present

## 2020-07-04 DIAGNOSIS — Z7984 Long term (current) use of oral hypoglycemic drugs: Secondary | ICD-10-CM | POA: Diagnosis not present

## 2020-07-04 DIAGNOSIS — H35013 Changes in retinal vascular appearance, bilateral: Secondary | ICD-10-CM | POA: Diagnosis not present

## 2020-07-04 DIAGNOSIS — H11153 Pinguecula, bilateral: Secondary | ICD-10-CM | POA: Diagnosis not present

## 2020-07-04 DIAGNOSIS — H00014 Hordeolum externum left upper eyelid: Secondary | ICD-10-CM | POA: Diagnosis not present

## 2020-07-04 DIAGNOSIS — H43392 Other vitreous opacities, left eye: Secondary | ICD-10-CM | POA: Diagnosis not present

## 2020-07-04 DIAGNOSIS — H34812 Central retinal vein occlusion, left eye, with macular edema: Secondary | ICD-10-CM | POA: Diagnosis not present

## 2020-07-11 DIAGNOSIS — E1169 Type 2 diabetes mellitus with other specified complication: Secondary | ICD-10-CM | POA: Diagnosis not present

## 2020-07-11 DIAGNOSIS — Z6826 Body mass index (BMI) 26.0-26.9, adult: Secondary | ICD-10-CM | POA: Diagnosis not present

## 2020-07-11 DIAGNOSIS — I509 Heart failure, unspecified: Secondary | ICD-10-CM | POA: Diagnosis not present

## 2020-07-11 DIAGNOSIS — I4891 Unspecified atrial fibrillation: Secondary | ICD-10-CM | POA: Diagnosis not present

## 2020-07-11 DIAGNOSIS — E785 Hyperlipidemia, unspecified: Secondary | ICD-10-CM | POA: Diagnosis not present

## 2020-07-16 NOTE — Progress Notes (Signed)
Cardiology Office Note:    Date:  07/18/2020   ID:  Jason [name], DOB [birth year], MRN [id number]  PCP:  Dulce Sellar, NP  Cardiologist:  Lesleigh Noe, MD   Referring MD: Dulce Sellar, NP   Chief Complaint  Patient presents with  . Atrial Fibrillation  . Congestive Heart Failure    History of Present Illness:    Jason [name] is a 80 y.o. male with a hx of acute on chronic systolic heart failure (LVEF by echo 20-25%), atrial fibrillation of unknown duration, hypertension, and silent coronary diseasedenotedby calcium on CT scan. Recent cardiac catheterization demonstrated moderate coronary disease and return of LVEF to 55%.  He is doing well.  He lost his wife in January.  They have been married for 60 years.  She had end-stage kidney disease and was on dialysis.  He feels well.  He has no orthopnea, PND, lower extremity swelling, or other symptoms of CHF.  He is unaware of any rapid heart beating.\  Recent lab work done by his primary physician demonstrated an LDL cholesterol of 39.  He and his primary physician wonder if we need to be on the dose of atorvastatin as currently prescribed.  Past Medical History:  Diagnosis Date  . Atrial fibrillation (HCC)   . Diabetes mellitus without complication (HCC)   . Hypertension     Past Surgical History:  Procedure Laterality Date  . CARDIAC CATHETERIZATION N/A 11/16/2015   Procedure: Left Heart Cath and Coronary Angiography;  Surgeon: Lyn Records, MD;  Location: [facility] INVASIVE CV LAB;  Service: Cardiovascular;  Laterality: N/A;  . CARDIOVERSION N/A 08/28/2015   Procedure: CARDIOVERSION;  Surgeon: Lewayne Bunting, MD;  Location: [facility] ENDOSCOPY;  Service: Cardiovascular;  Laterality: N/A;  . HERNIA REPAIR    . KIDNEY STONE SURGERY      Current Medications: Current Meds  Medication Sig  . acetaminophen (TYLENOL) 325 MG tablet Take 1-2 tablets (325-650 mg total) by mouth every 6 (six) hours as needed for mild pain,  moderate pain, fever or headache.  Marland Kitchen apixaban (ELIQUIS) 5 MG TABS tablet Take 1 tablet (5 mg total) by mouth 2 (two) times daily.  Marland Kitchen atorvastatin (LIPITOR) 40 MG tablet TAKE 1 TABLET BY MOUTH ONCE DAILY AT  6  PM  . carvedilol (COREG) 12.5 MG tablet Take 1 tablet by mouth twice daily  . cetirizine (ZYRTEC) 10 MG tablet Take 10 mg by mouth daily as needed for allergies.  . furosemide (LASIX) 40 MG tablet Take 1 tablet by mouth once daily  . glipiZIDE (GLUCOTROL XL) 2.5 MG 24 hr tablet Take 2.5 mg by mouth daily.  Marland Kitchen lisinopril (ZESTRIL) 5 MG tablet Take 1 tablet by mouth once daily  . metFORMIN (GLUCOPHAGE) 1000 MG tablet Take 1,000 mg by mouth 2 (two) times daily.  . Multiple Vitamins-Minerals (CENTRUM SILVER 50+MEN) TABS Take 1 tablet by mouth daily.     Allergies:   Patient has no known allergies.   Social History   Socioeconomic History  . Marital status: Married    Spouse name: Not on file  . Number of children: Not on file  . Years of education: Not on file  . Highest education level: Not on file  Occupational History  . Not on file  Tobacco Use  . Smoking status: Never Smoker  . Smokeless tobacco: Never Used  Vaping Use  . Vaping Use: Never used  Substance and Sexual Activity  . Alcohol use: No  .  Drug use: No  . Sexual activity: Never  Other Topics Concern  . Not on file  Social History Narrative  . Not on file   Social Determinants of Health   Financial Resource Strain: Not on file  Food Insecurity: Not on file  Transportation Needs: Not on file  Physical Activity: Not on file  Stress: Not on file  Social Connections: Not on file     Family History: The patient's family history includes Cancer in his father; Heart failure in his mother. There is no history of Heart attack or Hypertension.  ROS:   Please see the history of present illness.    Some depression.  Gout in his left foot.  Otherwise doing okay.  All other systems reviewed and are  negative.  EKGs/Labs/Other Studies Reviewed:    The following studies were reviewed today:  2D Doppler echocardiogram April 27, 2018: Study Conclusions   - Left ventricle: The cavity size was normal. Wall thickness was  increased in a pattern of mild LVH. There was mild focal basal  hypertrophy of the septum. Systolic function was normal. The  estimated ejection fraction was in the range of 55% to 60%. Wall  motion was normal; there were no regional wall motion  abnormalities. The study is not technically sufficient to allow  evaluation of LV diastolic function.  - Ascending aorta: The ascending aorta was mildly dilated.  - Mitral valve: Calcified annulus.   Impressions:   - Normal LV systolic function; mild LVH with proximal septal  thickening; mildly dilated ascending aorta (4.3 cm).    EKG:  EKG atrial fibrillation, borderline rate control, leftward axis, right bundle branch block/incomplete, and in comparison to prior from 06/04/2019, the rate is slower.  Otherwise unremarkable.  Recent Labs: No results found for requested labs within last 8760 hours.  Recent Lipid Panel    Component Value Date/Time   CHOL 130 07/29/2015 1857   TRIG 61 07/29/2015 1857   HDL 30 (L) 07/29/2015 1857   CHOLHDL 4.3 07/29/2015 1857   VLDL 12 07/29/2015 1857   LDLCALC 88 07/29/2015 1857    Physical Exam:    VS:  BP 116/80   Pulse 95   Ht 5\' 11"  (1.803 m)   Wt 193 lb (87.5 kg)   BMI 26.92 kg/m     Wt Readings from Last 3 Encounters:  07/18/20 193 lb (87.5 kg)  06/07/19 201 lb (91.2 kg)  06/16/18 208 lb (94.3 kg)     GEN: Compatible with age. No acute distress HEENT: Normal NECK: No JVD. LYMPHATICS: No lymphadenopathy CARDIAC: No murmur. IIRR no gallop, or edema. VASCULAR:  Normal Pulses. No bruits. RESPIRATORY:  Clear to auscultation without rales, wheezing or rhonchi  ABDOMEN: Soft, non-tender, non-distended, No pulsatile mass, MUSCULOSKELETAL: No deformity   SKIN: Warm and dry NEUROLOGIC:  Alert and oriented x 3 PSYCHIATRIC:  Normal affect   ASSESSMENT:    1. Paroxysmal atrial fibrillation (HCC)   2. Essential hypertension   3. Chronic anticoagulation   4. Chronic diastolic heart failure (HCC)   5. Coronary artery calcification seen on CT scan   6. Educated about COVID-19 virus infection    PLAN:    In order of problems listed above:  1. Persistent/permanent atrial fibrillation.  Borderline rate control but better than on last EKG.  Will repeat an echocardiogram to make sure LV function and size are holding up. 2. Excellent blood pressure control on the combination of Zestril Lasix and Coreg. 3. Continue Eliquis  5 mg twice daily 4. Continue Lasix 40 mg once daily. 5. He is on statin therapy.  LDL is 39.  Decrease atorvastatin to 20 mg/day.  Repeat a lipid panel and liver panel along with hemoglobin in 3 months.  Guideline directed therapy for left ventricular systolic dysfunction: Angiotensin receptor-neprilysin inhibitor (ARNI)-Entresto; beta-blocker therapy - carvedilol, metoprolol succinate, or bisoprolol; mineralocorticoid receptor antagonist (MRA) therapy -spironolactone or eplerenone.  SGLT-2 agents -  Dapagliflozin [name]) or Empagliflozin (Jardiance).These therapies have been shown to improve clinical outcomes including reduction of rehospitalization, survival, and acute heart failure.    Medication Adjustments/Labs and Tests Ordered: Current medicines are reviewed at length with the patient today.  Concerns regarding medicines are outlined above.  Orders Placed This Encounter  Procedures  . EKG 12-Lead   No orders of the defined types were placed in this encounter.   There are no Patient Instructions on file for this visit.   Signed, Lesleigh Noe, MD  07/18/2020 1:40 PM    [facility]

## 2020-07-18 ENCOUNTER — Ambulatory Visit (INDEPENDENT_AMBULATORY_CARE_PROVIDER_SITE_OTHER): Payer: Medicare Other | Admitting: Interventional Cardiology

## 2020-07-18 ENCOUNTER — Encounter: Payer: Self-pay | Admitting: Interventional Cardiology

## 2020-07-18 ENCOUNTER — Other Ambulatory Visit: Payer: Self-pay

## 2020-07-18 VITALS — BP 116/80 | HR 95 | Ht 71.0 in | Wt 193.0 lb

## 2020-07-18 DIAGNOSIS — Z7901 Long term (current) use of anticoagulants: Secondary | ICD-10-CM | POA: Diagnosis not present

## 2020-07-18 DIAGNOSIS — I5032 Chronic diastolic (congestive) heart failure: Secondary | ICD-10-CM

## 2020-07-18 DIAGNOSIS — I2510 Atherosclerotic heart disease of native coronary artery without angina pectoris: Secondary | ICD-10-CM | POA: Diagnosis not present

## 2020-07-18 DIAGNOSIS — Z7189 Other specified counseling: Secondary | ICD-10-CM | POA: Diagnosis not present

## 2020-07-18 DIAGNOSIS — I10 Essential (primary) hypertension: Secondary | ICD-10-CM

## 2020-07-18 DIAGNOSIS — I480 Paroxysmal atrial fibrillation: Secondary | ICD-10-CM | POA: Diagnosis not present

## 2020-07-18 MED ORDER — ATORVASTATIN CALCIUM 20 MG PO TABS: 20.0000 mg | ORAL_TABLET | Freq: Every day | ORAL | 3 refills | Status: DC

## 2020-07-18 NOTE — Patient Instructions (Signed)
Medication Instructions:  1) DECREASE Atorvastatin to 20mg  once daily  *If you need a refill on your cardiac medications before your next appointment, please call your pharmacy*   Lab Work: BMET, Liver, Lipid and CBC in 2-3 months.  You will need to be fasting for these labs (nothing to eat or drink after midnight except water and black coffee).  If you have labs (blood work) drawn today and your tests are completely normal, you will receive your results only by: MyChart Message (if you have MyChart) OR . A paper copy in the mail If you have any lab test that is abnormal or we need to change your treatment, we will call you to review the results.   Testing/Procedures: Your physician has requested that you have an echocardiogram. Echocardiography is a painless test that uses sound waves to create images of your heart. It provides your doctor with information about the size and shape of your heart and how well your heart's chambers and valves are working. This procedure takes approximately one hour. There are no restrictions for this procedure.   Follow-Up: At [facility], you and your health needs are our priority.  As part of our continuing mission to provide you with exceptional heart care, we have created designated Provider Care Teams.  These Care Teams include your primary Cardiologist (physician) and Advanced Practice Providers (APPs -  Physician Assistants and Nurse Practitioners) who all work together to provide you with the care you need, when you need it.  We recommend signing up for the patient portal called "MyChart".  Sign up information is provided on this After Visit Summary.  MyChart is used to connect with patients for Virtual Visits (Telemedicine).  Patients are able to view lab/test results, encounter notes, upcoming appointments, etc.  Non-urgent messages can be sent to your provider as well.   To learn more about what you can do with MyChart, go to  [facility].    Your next appointment:   1 year(s)  The format for your next appointment:   In Person  Provider:   You may see [email or web address], MD or one of the following Advanced Practice Providers on your designated Care Team:    Lesleigh Noe, NP    Other Instructions

## 2020-08-07 ENCOUNTER — Other Ambulatory Visit: Payer: Self-pay | Admitting: Interventional Cardiology

## 2020-08-22 ENCOUNTER — Other Ambulatory Visit: Payer: Self-pay | Admitting: Interventional Cardiology

## 2020-08-24 DIAGNOSIS — M25521 Pain in right elbow: Secondary | ICD-10-CM | POA: Diagnosis not present

## 2020-09-04 DIAGNOSIS — H3562 Retinal hemorrhage, left eye: Secondary | ICD-10-CM | POA: Diagnosis not present

## 2020-09-04 DIAGNOSIS — H43813 Vitreous degeneration, bilateral: Secondary | ICD-10-CM | POA: Diagnosis not present

## 2020-09-04 DIAGNOSIS — H34812 Central retinal vein occlusion, left eye, with macular edema: Secondary | ICD-10-CM | POA: Diagnosis not present

## 2020-09-05 ENCOUNTER — Telehealth: Payer: Self-pay | Admitting: *Deleted

## 2020-09-05 DIAGNOSIS — M7021 Olecranon bursitis, right elbow: Secondary | ICD-10-CM | POA: Diagnosis not present

## 2020-09-05 NOTE — Telephone Encounter (Signed)
   [place] Pre-operative Risk Assessment    Patient Name: Jason [name]  DOB: 03/28/1941  MRN: [id number]   HEARTCARE STAFF: - Please ensure there is not already an duplicate clearance open for this procedure. - Under Visit Info/Reason for Call, type in Other and utilize the format Clearance MM/DD/YY or Clearance TBD. Do not use dashes or single digits. - If request is for dental extraction, please clarify the # of teeth to be extracted.  Request for surgical clearance:  1. What type of surgery is being performed?  RT ELBOW BUSECTOMY   2. When is this surgery scheduled?  TBD   3. What type of clearance is required (medical clearance vs. Pharmacy clearance to hold med vs. Both)?  BOTH  4. Are there any medications that need to be held prior to surgery and how long? ELIQUIS   5. Practice name and name of physician performing surgery?  [facility] / DR. Percell Miller   6. What is the office phone number?  [phone]   7.   What is the office fax number?  [phone] ATTN:  KELLY  8.   Anesthesia type (None, local, MAC, general) ?     Jason [name] 09/05/2020, 10:34 AM  _________________________________________________________________   (provider comments below)

## 2020-09-05 NOTE — Telephone Encounter (Signed)
Will route to PharmD for rec's re: holding anticoagulation. Tereso Newcomer, PA-C    09/05/2020 11:51 AM

## 2020-09-05 NOTE — Telephone Encounter (Signed)
Spoke with [employer] @ [facility]. She states that his surgery is pending clearance, so after his Echo in June is perfectly fine.

## 2020-09-05 NOTE — Telephone Encounter (Signed)
Pt last seen by Dr. Katrinka Blazing in 4/22.  Echocardiogram was ordered to f/u on EF.  Echocardiogram scheduled for 09/19/20.  Please check with surgeon to see if ok to wait to do procedure until after echocardiogram results are back. Tereso Newcomer, PA-C    09/05/2020 1:56 PM

## 2020-09-05 NOTE — Telephone Encounter (Signed)
   Name: Jason [name] DOB: 1940-10-11  MRN: [id number]  Primary Cardiologist: Lesleigh Noe, MD  Chart reviewed as part of pre-operative protocol coverage.   80 y.o. male with . Coronary artery disease  o Cath 8/17: mod non-obs CAD  . HFimpEF (heart failure with improved ejection fraction)  o EF 20-25 >> 55-60 o Echocardiogram 04/2018: normal EF  . Persistent atrial fibrillation  . non-insulin dependent diabetes mellitus  . Hypertension   Last OV:  07/18/20 with Dr. Katrinka Blazing Procedure:  R elbow bursectomy  Rx:  Hold Eliquis  RCRI:  Perioperative Risk of Major Cardiac Event is (%): 0.9 (low risk) DASI:  Functional Capacity in METs is: 4.31 (functional status is good )  Patient was contacted 09/05/2020 in reference to pre-operative risk assessment for pending surgery as outlined below.    Since last seen, [name] has done well without chest pain, shortness of breath, orthopnea, leg swelling.  Echocardiogram is pending 09/19/20.   We will wait on results of echocardiogram on 09/19/20.  If no significant change on echocardiogram, will send notes to surgeon providing clearance.  As noted he can hold Eliquis for 2 days prior to his surgery.  Plan was outlined with the pt today.  Please call with questions. Tereso Newcomer, PA-C 09/05/2020, 2:41 PM

## 2020-09-05 NOTE — Telephone Encounter (Signed)
Patient with diagnosis of afib on Eliquis for anticoagulation.    Procedure: RT ELBOW BUSECTOMY  Date of procedure: TBD  CHA2DS2-VASc Score = 6  This indicates a 9.7% annual risk of stroke. The patient's score is based upon: CHF History: Yes HTN History: Yes Diabetes History: Yes Stroke History: No Vascular Disease History: Yes Age Score: 2 Gender Score: 0     CrCl 53.6 ml/min  Per office protocol, patient can hold Eliquis for 2 days prior to procedure.

## 2020-09-19 ENCOUNTER — Other Ambulatory Visit: Payer: Self-pay

## 2020-09-19 ENCOUNTER — Other Ambulatory Visit: Payer: Medicare Other | Admitting: *Deleted

## 2020-09-19 ENCOUNTER — Ambulatory Visit (HOSPITAL_COMMUNITY): Payer: Medicare Other | Attending: Cardiovascular Disease

## 2020-09-19 DIAGNOSIS — I2510 Atherosclerotic heart disease of native coronary artery without angina pectoris: Secondary | ICD-10-CM | POA: Diagnosis not present

## 2020-09-19 DIAGNOSIS — I5032 Chronic diastolic (congestive) heart failure: Secondary | ICD-10-CM | POA: Insufficient documentation

## 2020-09-19 DIAGNOSIS — I10 Essential (primary) hypertension: Secondary | ICD-10-CM

## 2020-09-19 DIAGNOSIS — I480 Paroxysmal atrial fibrillation: Secondary | ICD-10-CM

## 2020-09-19 LAB — BASIC METABOLIC PANEL
BUN/Creatinine Ratio: 19 (ref 10–24)
BUN: 21 mg/dL (ref 8–27)
CO2: 24 mmol/L (ref 20–29)
Calcium: 9.2 mg/dL (ref 8.6–10.2)
Chloride: 102 mmol/L (ref 96–106)
Creatinine, Ser: 1.13 mg/dL (ref 0.76–1.27)
Glucose: 103 mg/dL — ABNORMAL HIGH (ref 65–99)
Potassium: 4.5 mmol/L (ref 3.5–5.2)
Sodium: 140 mmol/L (ref 134–144)
eGFR: 66 mL/min/{1.73_m2} (ref >59)

## 2020-09-19 LAB — HEPATIC FUNCTION PANEL
ALT: 8 IU/L (ref 0–44)
AST: 13 IU/L (ref 0–40)
Albumin: 4 g/dL (ref 3.7–4.7)
Alkaline Phosphatase: 105 IU/L (ref 44–121)
Bilirubin Total: 1.2 mg/dL (ref 0.0–1.2)
Bilirubin, Direct: 0.35 mg/dL (ref 0.00–0.40)
Total Protein: 6.5 g/dL (ref 6.0–8.5)

## 2020-09-19 LAB — LIPID PANEL
Chol/HDL Ratio: 2.8 ratio (ref 0.0–5.0)
Cholesterol, Total: 82 mg/dL — ABNORMAL LOW (ref 100–199)
HDL: 29 mg/dL — ABNORMAL LOW (ref >39)
LDL Chol Calc (NIH): 38 mg/dL (ref 0–99)
Triglycerides: 65 mg/dL (ref 0–149)
VLDL Cholesterol Cal: 15 mg/dL (ref 5–40)

## 2020-09-19 LAB — CBC
Hematocrit: 42.1 % (ref 37.5–51.0)
Hemoglobin: 14.4 g/dL (ref 13.0–17.7)
MCH: 31.4 pg (ref 26.6–33.0)
MCHC: 34.2 g/dL (ref 31.5–35.7)
MCV: 92 fL (ref 79–97)
Platelets: 235 10*3/uL (ref 150–450)
RBC: 4.59 x10E6/uL (ref 4.14–5.80)
RDW: 13.2 % (ref 11.6–15.4)
WBC: 8.2 10*3/uL (ref 3.4–10.8)

## 2020-09-19 LAB — ECHOCARDIOGRAM COMPLETE
Area-P 1/2: 5.33 cm2
MV M vel: 5.27 m/s
MV Peak grad: 111.1 mmHg
P 1/2 time: 207 msec
S' Lateral: 3 cm

## 2020-09-20 ENCOUNTER — Telehealth: Payer: Self-pay | Admitting: *Deleted

## 2020-09-20 DIAGNOSIS — I77819 Aortic ectasia, unspecified site: Secondary | ICD-10-CM

## 2020-09-20 NOTE — Telephone Encounter (Signed)
Spoke with pt and made him aware of results and recommendations.  Pt agreeable to plan.  

## 2020-09-20 NOTE — Telephone Encounter (Signed)
-----   Message from Lyn Records, MD sent at 09/19/2020  6:12 PM EDT ----- Let the patient know the heart is back to normal squeeze. Aorta is dilated and leeds a CT with contrast to properly size. A copy will be sent to Dulce Sellar, NP

## 2020-10-01 NOTE — Telephone Encounter (Signed)
    [name] DOB:  06/14/1940  MRN:  [id number]   Primary Cardiologist: Lesleigh Noe, MD  Chart reviewed as part of pre-operative protocol coverage. Please see note below from [facility], [place], on 09/05/2020 regarding pre-op evaluation. Echo on 09/19/2020 showed LVEF of 50-55% with global hypokinesis and dilated ascending aortic aneurysm measuring 4.6 cm. Chest CTA ordered for further evaluation. Discussed with Doctor of the Day (Dr. Bjorn Pippin) who felt like CT does not need to be done prior to surgery. RCRI 0.9% (low risk) and patient able to complete > 4.0 METS. Given past medical history and time since last visit, based on ACC/AHA guidelines, RAUN ROUTH would be at acceptable risk for the planned procedure without further cardiovascular testing.   Per Pharmacy and office protocol, patient can hold Eliquis for 2 days prior to procedure. Please restart this as soon as able postoperatively.   I will route this recommendation to the requesting party via Epic fax function and remove from pre-op pool.  Please call with questions.  Corrin Parker, PA-C 10/01/2020, 11:59 AM

## 2020-10-04 DIAGNOSIS — M7021 Olecranon bursitis, right elbow: Secondary | ICD-10-CM | POA: Diagnosis not present

## 2020-10-09 ENCOUNTER — Ambulatory Visit
Admission: RE | Admit: 2020-10-09 | Discharge: 2020-10-09 | Disposition: A | Discharge status: Home / Self Care | Payer: Medicare Other | Source: Ambulatory Visit | Attending: Interventional Cardiology | Admitting: Interventional Cardiology

## 2020-10-09 ENCOUNTER — Other Ambulatory Visit: Payer: Self-pay

## 2020-10-09 DIAGNOSIS — I700 Atherosclerosis of aorta: Secondary | ICD-10-CM | POA: Diagnosis not present

## 2020-10-09 DIAGNOSIS — J8489 Other specified interstitial pulmonary diseases: Secondary | ICD-10-CM | POA: Diagnosis not present

## 2020-10-09 DIAGNOSIS — I712 Thoracic aortic aneurysm, without rupture: Secondary | ICD-10-CM | POA: Diagnosis not present

## 2020-10-09 DIAGNOSIS — I2510 Atherosclerotic heart disease of native coronary artery without angina pectoris: Secondary | ICD-10-CM | POA: Diagnosis not present

## 2020-10-09 DIAGNOSIS — I77819 Aortic ectasia, unspecified site: Secondary | ICD-10-CM

## 2020-10-09 MED ORDER — IOPAMIDOL (ISOVUE-370) INJECTION 76%
75.0000 mL | Freq: Once | INTRAVENOUS | Status: AC | PRN
  Administered 2020-10-09: 75 mL via INTRAVENOUS

## 2020-10-10 ENCOUNTER — Telehealth: Payer: Self-pay | Admitting: Interventional Cardiology

## 2020-10-10 NOTE — Telephone Encounter (Signed)
Patient would like someone to go over his recent CT and Echo results with him. He saw the results on MyChart but would like someone to describe them in more detail. Please call

## 2020-10-10 NOTE — Telephone Encounter (Signed)
Spoke with pt and made him aware that I am waiting on Dr. Katrinka Blazing to do a final interpretation of his test.  I did mention the lung nodule and to follow up with PCP about this.  Pt seeing PCP Friday and asked that I send a copy over.  Copy sent.  Pt aware that I will call back once Dr. Katrinka Blazing reviews everything.

## 2020-10-12 ENCOUNTER — Telehealth: Payer: Self-pay | Admitting: *Deleted

## 2020-10-12 DIAGNOSIS — Z1331 Encounter for screening for depression: Secondary | ICD-10-CM | POA: Diagnosis not present

## 2020-10-12 DIAGNOSIS — Z9181 History of falling: Secondary | ICD-10-CM | POA: Diagnosis not present

## 2020-10-12 DIAGNOSIS — E785 Hyperlipidemia, unspecified: Secondary | ICD-10-CM | POA: Diagnosis not present

## 2020-10-12 DIAGNOSIS — E1169 Type 2 diabetes mellitus with other specified complication: Secondary | ICD-10-CM | POA: Diagnosis not present

## 2020-10-12 DIAGNOSIS — I4891 Unspecified atrial fibrillation: Secondary | ICD-10-CM | POA: Diagnosis not present

## 2020-10-12 DIAGNOSIS — I509 Heart failure, unspecified: Secondary | ICD-10-CM | POA: Diagnosis not present

## 2020-10-12 NOTE — Telephone Encounter (Signed)
-----   Message from Lyn Records, MD sent at 10/11/2020  9:40 PM EDT ----- Let the patient know the aorta is mildly dilated but stable in size c/w prior. There is a nodule in lung. CT needs repeating in 1 year. A copy will be sent to Dulce Sellar, NP

## 2020-10-17 DIAGNOSIS — M7021 Olecranon bursitis, right elbow: Secondary | ICD-10-CM | POA: Diagnosis not present

## 2020-10-19 DIAGNOSIS — H34812 Central retinal vein occlusion, left eye, with macular edema: Secondary | ICD-10-CM | POA: Diagnosis not present

## 2020-11-16 DIAGNOSIS — H34812 Central retinal vein occlusion, left eye, with macular edema: Secondary | ICD-10-CM | POA: Diagnosis not present

## 2020-11-16 DIAGNOSIS — H43813 Vitreous degeneration, bilateral: Secondary | ICD-10-CM | POA: Diagnosis not present

## 2020-11-23 DIAGNOSIS — Z23 Encounter for immunization: Secondary | ICD-10-CM | POA: Diagnosis not present

## 2021-01-15 DIAGNOSIS — Z6827 Body mass index (BMI) 27.0-27.9, adult: Secondary | ICD-10-CM | POA: Diagnosis not present

## 2021-01-15 DIAGNOSIS — Z79899 Other long term (current) drug therapy: Secondary | ICD-10-CM | POA: Diagnosis not present

## 2021-01-15 DIAGNOSIS — Z139 Encounter for screening, unspecified: Secondary | ICD-10-CM | POA: Diagnosis not present

## 2021-01-15 DIAGNOSIS — E1169 Type 2 diabetes mellitus with other specified complication: Secondary | ICD-10-CM | POA: Diagnosis not present

## 2021-01-15 DIAGNOSIS — I509 Heart failure, unspecified: Secondary | ICD-10-CM | POA: Diagnosis not present

## 2021-01-15 DIAGNOSIS — E785 Hyperlipidemia, unspecified: Secondary | ICD-10-CM | POA: Diagnosis not present

## 2021-01-15 DIAGNOSIS — Z23 Encounter for immunization: Secondary | ICD-10-CM | POA: Diagnosis not present

## 2021-01-18 DIAGNOSIS — H43813 Vitreous degeneration, bilateral: Secondary | ICD-10-CM | POA: Diagnosis not present

## 2021-01-18 DIAGNOSIS — E119 Type 2 diabetes mellitus without complications: Secondary | ICD-10-CM | POA: Diagnosis not present

## 2021-01-18 DIAGNOSIS — H3562 Retinal hemorrhage, left eye: Secondary | ICD-10-CM | POA: Diagnosis not present

## 2021-01-18 DIAGNOSIS — H34812 Central retinal vein occlusion, left eye, with macular edema: Secondary | ICD-10-CM | POA: Diagnosis not present

## 2021-03-05 ENCOUNTER — Telehealth: Payer: Self-pay

## 2021-03-05 NOTE — Telephone Encounter (Signed)
**Note De-Identified  Obfuscation** The pts completed BMSPAF application for Eliquis was left at the office.  I printed his med/allergy list, ins cards, and have completed the providers page of his application. I have e-mailed all to Dr Michaelle Copas nurse so she can obtain his signature, date it, and to fax all to [facility] at the fax number written on the cover letter included or to place in the to be faxed basket in Medical Records to be faxed.

## 2021-03-11 NOTE — Telephone Encounter (Signed)
Paperwork completed and faxed.  °

## 2021-03-15 DIAGNOSIS — S0500XA Injury of conjunctiva and corneal abrasion without foreign body, unspecified eye, initial encounter: Secondary | ICD-10-CM | POA: Diagnosis not present

## 2021-03-20 NOTE — Telephone Encounter (Signed)
Pt is f/u on  BMSPAF application paperwork, he called [facility] this morning and was advised that the paperwork have not been fwd'd to them yet. Please advise pt further

## 2021-03-20 NOTE — Telephone Encounter (Signed)
Patient of Dr. Katrinka Blazing Routed to [facility] Triage

## 2021-03-21 NOTE — Telephone Encounter (Signed)
Will route this message to our Prior Auth Nurse and covering, as well as Dr. Michaelle Copas RN, to further review, advise, and follow-up with the pt thereafter.

## 2021-03-21 NOTE — Telephone Encounter (Signed)
  Cecilia from [employer] called, she said she called this morning and was told the pt assistance was refax yesterday, she waited until this afternoon but they still did not receive anything. She requested to refax to fax# [phone]

## 2021-03-22 NOTE — Telephone Encounter (Signed)
Paper work faxed again 

## 2021-04-17 DIAGNOSIS — Z79899 Other long term (current) drug therapy: Secondary | ICD-10-CM | POA: Diagnosis not present

## 2021-04-17 DIAGNOSIS — E785 Hyperlipidemia, unspecified: Secondary | ICD-10-CM | POA: Diagnosis not present

## 2021-04-17 DIAGNOSIS — I509 Heart failure, unspecified: Secondary | ICD-10-CM | POA: Diagnosis not present

## 2021-04-17 DIAGNOSIS — Z6827 Body mass index (BMI) 27.0-27.9, adult: Secondary | ICD-10-CM | POA: Diagnosis not present

## 2021-04-17 DIAGNOSIS — E1169 Type 2 diabetes mellitus with other specified complication: Secondary | ICD-10-CM | POA: Diagnosis not present

## 2021-04-17 DIAGNOSIS — I4891 Unspecified atrial fibrillation: Secondary | ICD-10-CM | POA: Diagnosis not present

## 2021-04-19 DIAGNOSIS — E119 Type 2 diabetes mellitus without complications: Secondary | ICD-10-CM | POA: Diagnosis not present

## 2021-04-19 DIAGNOSIS — H34812 Central retinal vein occlusion, left eye, with macular edema: Secondary | ICD-10-CM | POA: Diagnosis not present

## 2021-04-19 DIAGNOSIS — H43813 Vitreous degeneration, bilateral: Secondary | ICD-10-CM | POA: Diagnosis not present

## 2021-04-19 DIAGNOSIS — H35371 Puckering of macula, right eye: Secondary | ICD-10-CM | POA: Diagnosis not present

## 2021-05-14 DIAGNOSIS — I509 Heart failure, unspecified: Secondary | ICD-10-CM | POA: Diagnosis not present

## 2021-05-14 DIAGNOSIS — E1169 Type 2 diabetes mellitus with other specified complication: Secondary | ICD-10-CM | POA: Diagnosis not present

## 2021-06-03 DIAGNOSIS — Z20822 Contact with and (suspected) exposure to covid-19: Secondary | ICD-10-CM | POA: Diagnosis not present

## 2021-06-14 DIAGNOSIS — H34812 Central retinal vein occlusion, left eye, with macular edema: Secondary | ICD-10-CM | POA: Diagnosis not present

## 2021-06-14 DIAGNOSIS — H3562 Retinal hemorrhage, left eye: Secondary | ICD-10-CM | POA: Diagnosis not present

## 2021-06-14 DIAGNOSIS — H43813 Vitreous degeneration, bilateral: Secondary | ICD-10-CM | POA: Diagnosis not present

## 2021-06-14 DIAGNOSIS — H35371 Puckering of macula, right eye: Secondary | ICD-10-CM | POA: Diagnosis not present

## 2021-06-14 NOTE — Telephone Encounter (Signed)
**Note De-Identified  Obfuscation** Per letter received  fax from [facility] they have approved the pt for asst with Eliquis until 04/13/2022. ?BMSPAF Case #: [id number] ? ?The letter states that they have notified the pt of this approval as well. ?

## 2021-07-10 ENCOUNTER — Other Ambulatory Visit: Payer: Self-pay | Admitting: *Deleted

## 2021-07-10 DIAGNOSIS — I77819 Aortic ectasia, unspecified site: Secondary | ICD-10-CM

## 2021-07-24 ENCOUNTER — Telehealth: Payer: Self-pay | Admitting: Interventional Cardiology

## 2021-07-24 NOTE — Telephone Encounter (Signed)
Pt c/o BP issue: STAT if pt c/o blurred vision, one-sided weakness or slurred speech ? ?1. What are your last 5 BP readings? This morning 87/70 ? ?2. Are you having any other symptoms (ex. Dizziness, headache, blurred vision, passed out)? Fatigue, sensitive to light ? ?3. What is your BP issue? Patient's daughter states the patient's BP has been very low. She says this morning it was so low he did not take his BP medication. She says he also "feels so bad". She says he has no energy and is sensitive to light. She says even sitting in the chair he is tired. She says she has been checking his blood sugar and it is normal.   ?

## 2021-07-24 NOTE — Telephone Encounter (Signed)
Spoke with pt's daughter and she states that pt contacted her today about his BP being low.  Cuff read 87/70 this morning.  He admitted to her that it has been running much lower recently and he just generally feels bad and has increased fatigue.  Daughter said he described as "my battery is running low and giving out".  Denies fluttering, chest pain, SOB or dizziness.  Pt skipped all of his morning BP medication today.  Granddaughter was over this afternoon and pt retook his BP and it was 105/85.  Daughter states he doesn't feel like this everyday but he has been feeling bad more often.  Pt is diabetic and checks his sugar and she said those have been fine.  Scheduled pt to come in tomorrow to see Dr. Lynnette Caffey, DOD.  Daughter very appreciative for assistance.  I did tell daughter to have pt check BP in AM and if SBP below 110, hold off on medication until he sees MD tomorrow.  ?

## 2021-07-25 ENCOUNTER — Encounter: Payer: Self-pay | Admitting: Internal Medicine

## 2021-07-25 ENCOUNTER — Ambulatory Visit (INDEPENDENT_AMBULATORY_CARE_PROVIDER_SITE_OTHER): Payer: Medicare Other | Admitting: Internal Medicine

## 2021-07-25 VITALS — BP 134/76 | HR 102 | Ht 71.0 in | Wt 193.2 lb

## 2021-07-25 DIAGNOSIS — I959 Hypotension, unspecified: Secondary | ICD-10-CM | POA: Diagnosis not present

## 2021-07-25 DIAGNOSIS — E785 Hyperlipidemia, unspecified: Secondary | ICD-10-CM

## 2021-07-25 DIAGNOSIS — E1169 Type 2 diabetes mellitus with other specified complication: Secondary | ICD-10-CM

## 2021-07-25 DIAGNOSIS — I152 Hypertension secondary to endocrine disorders: Secondary | ICD-10-CM

## 2021-07-25 DIAGNOSIS — I77819 Aortic ectasia, unspecified site: Secondary | ICD-10-CM | POA: Diagnosis not present

## 2021-07-25 DIAGNOSIS — E1159 Type 2 diabetes mellitus with other circulatory complications: Secondary | ICD-10-CM | POA: Diagnosis not present

## 2021-07-25 DIAGNOSIS — E119 Type 2 diabetes mellitus without complications: Secondary | ICD-10-CM | POA: Diagnosis not present

## 2021-07-25 DIAGNOSIS — I480 Paroxysmal atrial fibrillation: Secondary | ICD-10-CM | POA: Diagnosis not present

## 2021-07-25 NOTE — Progress Notes (Signed)
?Cardiology Office Note:   ? ?Date:  07/25/2021  ? ?ID:  Jason [name], DOB 06-03-1940, MRN [id number] ? ?PCP:  Dulce SellarHudnell, Stephanie, NP  ? ?[facility] Providers ?Cardiologist:  Alverda Skeanshukkani, , MD ?Referring MD: Dulce SellarHudnell, Stephanie, NP  ? ?Chief Complaint/Reason for Referral: Low blood pressure ? ?ASSESSMENT:   ? ?1. Hypotension, unspecified hypotension type   ?2. Aortic dilatation (HCC)   ?3. Type 2 diabetes mellitus without complication, without long-term current use of insulin (HCC)   ?4. Hypertension associated with diabetes (HCC)   ?5. Hyperlipidemia associated with type 2 diabetes mellitus (HCC)   ?6. Paroxysmal atrial fibrillation (HCC)   ? ? ?PLAN:   ? ?In order of problems listed above: ?1.  The patient feels better off his lisinopril.  His blood pressure is much improved.  I have advised him to go ahead and stop this medication for now even though it is renal protective.  If he does not feel better over the next few weeks asked him to contact our office and then we may proceed with cardioversion.  Unfortunately his left atrium is severely dilated so a cardioversion may not be durable and he may need antiarrhythmic agent or consideration for atrial fibrillation ablation.  We will have the patient follow-up with us in the next few months.  He will contact us regarding how he feels. ?2.  CT scan June 2020 demonstrated an ascending thoracic aortic maximal diameter of 4.1 cm.  He is scheduled for repeat CT scan in the future per Dr. Katrinka BlazingSmith. ?3.  Continue apixaban in lieu of aspirin, continue atorvastatin, consider SGLT2 inhibitor in the future, and will hold lisinopril now. ?4.  Blood pressure today is reasonable off lisinopril. ?5.  Goal LDL is less than 70; LDL in June of last year was 5238. ?6.  See discussion above. ? ? ?     ? ?   ? ?Dispo:  No follow-ups on file.  ? ?  ? ?Medication Adjustments/Labs and Tests Ordered: ?Current medicines are reviewed at length with the patient today.  Concerns regarding  medicines are outlined above. ? ?The following changes have been made:    ? ?Labs/tests ordered: ?Orders Placed This Encounter  ?Procedures  ? EKG 12-Lead  ? ? ?Medication Changes: ?No orders of the defined types were placed in this encounter. ? ? ? ?Current medicines are reviewed at length with the patient today.  The patient does not have concerns regarding medicines. ? ? ?History of Present Illness:   ? ?FOCUSED PROBLEM LIST:   ?1.  Prior severe LV dysfunction now resolved with ejection fraction of 50 to 55% 2022 ?2.  Aortic dilatation of 46 mm noted on echocardiogram 2022  ?3.  Moderate obstructive coronary artery disease with 65% stenosis of proximal LAD, 30% stenosis of mid right coronary artery, 75% stenosis of first marginal cardiac catheterization 2017 treated medically ?4.  Atrial fibrillation on Eliquis ?5.  Type 2 diabetes on oral medications ?6.  Hypertension ?7.  Hyperlipidemia ? ?The patient is a 81 y.o. male with the indicated medical history here for doctor of the day visit due to hypotension.  Apparently the patient called for blood pressure of 87/70, he has also been feeling very fatigued.  Blood sugars were reportedly normal. ? ?He is here with his wife and daughter.  He tells me that he stopped his lisinopril on his own and he feels much better.  He does feel palpitations at times.  He has had no significant chest pain  or severe bleeding.  He feels as good as he felt last year he tells me now that he has been off the lisinopril for a few days.  We together did review his EKGs and it looks like his last cardioversion in 2017 was durable until around January 2020.  His left atrial size was only moderately dilated in 2017 and on his most recent echocardiogram it is severely dilated. ? ? ? ?Current Medications: ?Current Meds  ?Medication Sig  ? acetaminophen (TYLENOL) 325 MG tablet Take 1-2 tablets (325-650 mg total) by mouth every 6 (six) hours as needed for mild pain, moderate pain, fever or  headache.  ? apixaban (ELIQUIS) 5 MG TABS tablet Take 1 tablet (5 mg total) by mouth 2 (two) times daily.  ? atorvastatin (LIPITOR) 20 MG tablet Take 1 tablet (20 mg total) by mouth daily.  ? carvedilol (COREG) 12.5 MG tablet Take 1 tablet by mouth twice daily  ? cetirizine (ZYRTEC) 10 MG tablet Take 10 mg by mouth daily as needed for allergies.  ? furosemide (LASIX) 40 MG tablet Take 1 tablet by mouth once daily  ? glipiZIDE (GLUCOTROL XL) 2.5 MG 24 hr tablet Take 2.5 mg by mouth daily.  ? metFORMIN (GLUCOPHAGE) 1000 MG tablet Take 1,000 mg by mouth 2 (two) times daily.  ? Multiple Vitamins-Minerals (CENTRUM SILVER 50+MEN) TABS Take 1 tablet by mouth daily.  ?  ? ?Allergies:    ?Patient has no known allergies.  ? ?Social History:   ?Social History  ? ?Tobacco Use  ? Smoking status: Never  ? Smokeless tobacco: Never  ?Vaping Use  ? Vaping Use: Never used  ?Substance Use Topics  ? Alcohol use: No  ? Drug use: No  ?  ? ?Family Hx: ?Family History  ?Problem Relation Age of Onset  ? Heart failure Mother   ? Cancer Father   ? Heart attack Neg Hx   ? Hypertension Neg Hx   ?  ? ?Review of Systems:   ?Please see the history of present illness.    ?All other systems reviewed and are negative. ?  ? ? ?EKGs/Labs/Other Test Reviewed:   ? ?EKG:  EKG performed April 2022 that I personally reviewed demonstrates rate controlled atrial fibrillation with incomplete right bundle branch block; EKG done today demonstrates atrial fibrillation with rapid ventricular rate and incomplete right bundle branch block. ? ?Prior CV studies: ? ?Cath 2017 ?Noncritical coronary atherosclerosis with 60-70% calcified eccentric mid LAD stenosis. 75% stenosis in the midportion of a small first obtuse marginal, and 30% mid RCA. ?Normal left ventricular function. Elevated left ventricular end-diastolic pressure compatible with diastolic dysfunction. EF 55-60%. ?Apical ischemia felt secondary to borderline significant mid LAD. In absence of symptoms  intensification of medical therapy seems most prudent approach. ? ?TTE 2022 ?1. Left ventricular ejection fraction, by estimation, is 50 to 55%. The  ?left ventricle has low normal function. The left ventricle demonstrates  ?global hypokinesis. There is mild concentric left ventricular hypertrophy.  ?Left ventricular diastolic  ?function could not be evaluated.  ? 2. Right ventricular systolic function is normal. The right ventricular  ?size is normal. There is mildly elevated pulmonary artery systolic  ?pressure. The estimated right ventricular systolic pressure is 40.0 mmHg.  ? 3. Left atrial size was severely dilated.  ? 4. Right atrial size was severely dilated.  ? 5. The mitral valve is normal in structure. Mild mitral valve  ?regurgitation.  ? 6. The aortic valve is tricuspid. Aortic valve regurgitation  is not  ?visualized. Mild aortic valve sclerosis is present, with no evidence of  ?aortic valve stenosis.  ? 7. Aortic dilatation noted. There is moderate dilatation of the ascending  ?aorta, measuring 46 mm.  ? 8. The inferior vena cava is dilated in size with <50% respiratory  ?variability, suggesting right atrial pressure of 15 mmHg.  ? ?Monitor 2020 ?Continuous atrial fibrillation ?No significant pauses ?No significant ventricular ectopy ? ? ?Other studies Reviewed: ?Review of the additional studies/records demonstrates: No relevant studies ? ?Recent Labs: ?09/19/2020: ALT 8; BUN 21; Creatinine, Ser 1.13; Hemoglobin 14.4; Platelets 235; Potassium 4.5; Sodium 140  ? ?Recent Lipid Panel ?Lab Results  ?Component Value Date/Time  ? CHOL 82 (L) 09/19/2020 09:38 AM  ? TRIG 65 09/19/2020 09:38 AM  ? HDL 29 (L) 09/19/2020 09:38 AM  ? LDLCALC 38 09/19/2020 09:38 AM  ? ? ?Risk Assessment/Calculations:   ? ? ?CHA2DS2-VASc Score = 6  ? This indicates a 9.7% annual risk of stroke. ?The patient's score is based upon: ?CHF History: 1 ?HTN History: 1 ?Diabetes History: 1 ?Stroke History: 0 ?Vascular Disease History: 1 ?Age  Score: 2 ?Gender Score: 0 ?  ? ?    ? ?Physical Exam:   ? ?VS:  BP 134/76   Pulse (!) 102   Ht 5\' 11"  (1.803 m)   Wt 193 lb 3.2 oz (87.6 kg)   SpO2 98%   BMI 26.95 kg/m?    ?Wt Readings from Last 3 Encounters:  ?04/13

## 2021-07-25 NOTE — Patient Instructions (Signed)
Medication Instructions:  ?1.) stop lisinopril ? ?*If you need a refill on your cardiac medications before your next appointment, please call your pharmacy* ? ? ?Lab Work: ?none ? ? ?Testing/Procedures: ?none ? ? ?Follow-Up: ?With Dr. Katrinka Blazing in next 2-3 months ? ?Other Instructions ?Call back to Dr. Michaelle Copas office if you continue feeling poorly. ? ?Important Information About Sugar ? ? ? ? ?  ?

## 2021-07-29 ENCOUNTER — Other Ambulatory Visit: Payer: Self-pay | Admitting: Interventional Cardiology

## 2021-08-05 ENCOUNTER — Ambulatory Visit
Admission: RE | Admit: 2021-08-05 | Discharge: 2021-08-05 | Disposition: A | Discharge status: Home / Self Care | Payer: Medicare Other | Source: Ambulatory Visit | Attending: Interventional Cardiology | Admitting: Interventional Cardiology

## 2021-08-05 DIAGNOSIS — I77819 Aortic ectasia, unspecified site: Secondary | ICD-10-CM

## 2021-08-05 DIAGNOSIS — K8020 Calculus of gallbladder without cholecystitis without obstruction: Secondary | ICD-10-CM | POA: Diagnosis not present

## 2021-08-05 DIAGNOSIS — J9811 Atelectasis: Secondary | ICD-10-CM | POA: Diagnosis not present

## 2021-08-05 DIAGNOSIS — I712 Thoracic aortic aneurysm, without rupture, unspecified: Secondary | ICD-10-CM | POA: Diagnosis not present

## 2021-08-05 DIAGNOSIS — Z20822 Contact with and (suspected) exposure to covid-19: Secondary | ICD-10-CM | POA: Diagnosis not present

## 2021-08-05 DIAGNOSIS — I700 Atherosclerosis of aorta: Secondary | ICD-10-CM | POA: Diagnosis not present

## 2021-08-05 MED ORDER — IOPAMIDOL (ISOVUE-370) INJECTION 76%
75.0000 mL | Freq: Once | INTRAVENOUS | Status: AC | PRN
  Administered 2021-08-05: 75 mL via INTRAVENOUS

## 2021-08-09 DIAGNOSIS — H35371 Puckering of macula, right eye: Secondary | ICD-10-CM | POA: Diagnosis not present

## 2021-08-09 DIAGNOSIS — H43813 Vitreous degeneration, bilateral: Secondary | ICD-10-CM | POA: Diagnosis not present

## 2021-08-09 DIAGNOSIS — H34812 Central retinal vein occlusion, left eye, with macular edema: Secondary | ICD-10-CM | POA: Diagnosis not present

## 2021-08-09 DIAGNOSIS — H3562 Retinal hemorrhage, left eye: Secondary | ICD-10-CM | POA: Diagnosis not present

## 2021-08-20 ENCOUNTER — Other Ambulatory Visit: Payer: Self-pay | Admitting: Interventional Cardiology

## 2021-09-04 NOTE — Progress Notes (Addendum)
Cardiology Office Note:    Date:  09/05/2021   ID:  Jason [name], DOB 04-16-1940, MRN [id number]  PCP:  Eunice Blase, PA-C  Cardiologist:  Lesleigh Noe, MD   Referring MD: Dulce Sellar, NP   Chief Complaint  Patient presents with   Atrial Fibrillation   Congestive Heart Failure    History of Present Illness:    Jason [name] is a 81 y.o. male with a hx of acute on chronic systolic heart failure (LVEF by echo 20-25%), atrial fibrillation of unknown duration, hypertension, and silent coronary disease denoted by calcium on CT scan. Recent cardiac catheterization demonstrated moderate coronary disease and return of LVEF to 55%.  Most recently seen by Dr. Lynnette Caffey on 07/25/2021, lisinopril was discontinued because of hypotension.  The patient was in atrial fibrillation at that time as well.  Cardioversion was decided against because of large left atrial diameter.  Was seen as noted above and lisinopril was discontinued.  Blood pressures have been better.  He is still in atrial fibrillation and still has exertional intolerance.  Shortness of breath is not a major issue.  There is no orthopnea.  No symptoms or signs of volume overload.  Appetite is been diminishing.  He denies angina.  He has not had syncope.  He feels better since lisinopril was discontinued.  He is compliant with his medications.  Past Medical History:  Diagnosis Date   Atrial fibrillation (HCC)    Diabetes mellitus without complication (HCC)    Hypertension     Past Surgical History:  Procedure Laterality Date   CARDIAC CATHETERIZATION N/A 11/16/2015   Procedure: Left Heart Cath and Coronary Angiography;  Surgeon: Lyn Records, MD;  Location: [facility] INVASIVE CV LAB;  Service: Cardiovascular;  Laterality: N/A;   CARDIOVERSION N/A 08/28/2015   Procedure: CARDIOVERSION;  Surgeon: Lewayne Bunting, MD;  Location: [facility] ENDOSCOPY;  Service: Cardiovascular;  Laterality: N/A;   HERNIA REPAIR     KIDNEY STONE SURGERY       Current Medications: Current Meds  Medication Sig   acetaminophen (TYLENOL) 325 MG tablet Take 1-2 tablets (325-650 mg total) by mouth every 6 (six) hours as needed for mild pain, moderate pain, fever or headache.   apixaban (ELIQUIS) 5 MG TABS tablet Take 1 tablet (5 mg total) by mouth 2 (two) times daily.   atorvastatin (LIPITOR) 20 MG tablet Take 1 tablet (20 mg total) by mouth daily. Please keep upcoming appointment in June for future refills. Thank you   carvedilol (COREG) 12.5 MG tablet Take 1 tablet by mouth twice daily   cetirizine (ZYRTEC) 10 MG tablet Take 10 mg by mouth daily as needed for allergies.   furosemide (LASIX) 40 MG tablet Take 1 tablet by mouth once daily   glipiZIDE (GLUCOTROL XL) 2.5 MG 24 hr tablet Take 2.5 mg by mouth daily.   metFORMIN (GLUCOPHAGE) 1000 MG tablet Take 1,000 mg by mouth 2 (two) times daily.   Multiple Vitamins-Minerals (CENTRUM SILVER 50+MEN) TABS Take 1 tablet by mouth daily.     Allergies:   Patient has no known allergies.   Social History   Socioeconomic History   Marital status: Married    Spouse name: Not on file   Number of children: Not on file   Years of education: Not on file   Highest education level: Not on file  Occupational History   Not on file  Tobacco Use   Smoking status: Never   Smokeless tobacco: Never  Vaping Use   Vaping Use: Never used  Substance and Sexual Activity   Alcohol use: No   Drug use: No   Sexual activity: Never  Other Topics Concern   Not on file  Social History Narrative   Not on file   Social Determinants of Health   Financial Resource Strain: Not on file  Food Insecurity: Not on file  Transportation Needs: Not on file  Physical Activity: Not on file  Stress: Not on file  Social Connections: Not on file     Family History: The patient's family history includes Cancer in his father; Heart failure in his mother. There is no history of Heart attack or Hypertension.  ROS:   Please  see the history of present illness.    Awakens at night to urinate.  Has not had syncope.  All other systems reviewed and are negative.  EKGs/Labs/Other Studies Reviewed:    The following studies were reviewed today:   2D Doppler echocardiogram June 2022: IMPRESSIONS   1. Left ventricular ejection fraction, by estimation, is 50 to 55%. The  left ventricle has low normal function. The left ventricle demonstrates  global hypokinesis. There is mild concentric left ventricular hypertrophy.  Left ventricular diastolic  function could not be evaluated.   2. Right ventricular systolic function is normal. The right ventricular  size is normal. There is mildly elevated pulmonary artery systolic  pressure. The estimated right ventricular systolic pressure is 40.0 mmHg.   3. Left atrial size was severely dilated.   4. Right atrial size was severely dilated.   5. The mitral valve is normal in structure. Mild mitral valve  regurgitation.   6. The aortic valve is tricuspid. Aortic valve regurgitation is not  visualized. Mild aortic valve sclerosis is present, with no evidence of  aortic valve stenosis.   7. Aortic dilatation noted. There is moderate dilatation of the ascending  aorta, measuring 46 mm.   8. The inferior vena cava is dilated in size with <50% respiratory  variability, suggesting right atrial pressure of 15 mmHg.   Comparison(s): Prior images reviewed side by side. The left ventricular  function is worsened. On the current study the rhyth is atrial  fibrillation with ventricular rates 110-130 bpm.     CT angio chest April 2023: IMPRESSION: Grossly stable 4.1 cm ascending thoracic aortic aneurysm. Recommend annual imaging followup by CTA or MRA. This recommendation follows 2010 ACCF/AHA/AATS/ACR/ASA/SCA/SCAI/SIR/STS/SVM Guidelines for the Diagnosis and Management of Patients with Thoracic Aortic Disease. Circulation. 2010; 121: L875-I433. Aortic aneurysm NOS (ICD10-I71.9).    Cholelithiasis.   Aortic Atherosclerosis (ICD10-I70.0).    EKG:  EKG pulse oximeter measured heart rate of 41 bpm however EKG demonstrated atrial fibrillation with ventricular rate of 92 bpm, incomplete right bundle branch block, left axis deviation.  When compared to the prior tracing from April, heart rate is slightly slower.  Recent Labs: 09/19/2020: ALT 8; BUN 21; Creatinine, Ser 1.13; Hemoglobin 14.4; Platelets 235; Potassium 4.5; Sodium 140  Recent Lipid Panel    Component Value Date/Time   CHOL 82 (L) 09/19/2020 0938   TRIG 65 09/19/2020 0938   HDL 29 (L) 09/19/2020 0938   CHOLHDL 2.8 09/19/2020 0938   CHOLHDL 4.3 07/29/2015 1857   VLDL 12 07/29/2015 1857   LDLCALC 38 09/19/2020 0938    Physical Exam:    VS:  BP 112/84   Pulse 96   Ht 5\' 11"  (1.803 m)   Wt 190 lb (86.2 kg)   SpO2 96%  BMI 26.50 kg/m     Wt Readings from Last 3 Encounters:  09/05/21 190 lb (86.2 kg)  07/25/21 193 lb 3.2 oz (87.6 kg)  07/18/20 193 lb (87.5 kg)     GEN: Elderly.  Not overweight.. No acute distress HEENT: Normal NECK: No JVD. LYMPHATICS: No lymphadenopathy CARDIAC: No murmur.  Irregularly irregular RR no gallop, or edema. VASCULAR:  Normal Pulses. No bruits. RESPIRATORY:  Clear to auscultation without rales, wheezing or rhonchi  ABDOMEN: Soft, non-tender, non-distended, No pulsatile mass, MUSCULOSKELETAL: No deformity  SKIN: Warm and dry NEUROLOGIC:  Alert and oriented x 3 PSYCHIATRIC:  Normal affect   ASSESSMENT:    1. Persistent atrial fibrillation (HCC)   2. Chronic diastolic heart failure (HCC)   3. Coronary artery calcification seen on CT scan   4. Aneurysm of ascending aorta without rupture (HCC)   5. Essential hypertension   6. Hyperlipidemia associated with type 2 diabetes mellitus (HCC)   7. Chronic anticoagulation    PLAN:    In order of problems listed above:  Rate is not well controlled.  Add Lanoxin 0.125 mg a day for 1 week then decrease to 0.0625  mg/day thereafter.  Maintain carvedilol dose as is. No evidence of volume overload on exam.  BNP will be obtained today. Secondary prevention is reviewed.  Continue Lipitor. By CT scan, the aorta measures 4.1 cm.  This is not threatening.  The echo result was likely due to a tangential plane from which the diameter was measured. Blood pressure still relatively low for age.  We will slow the heart rate was slow Lanoxin.  If blood pressure remains relatively low, and BNP is not significantly elevated.  May consider decreasing furosemide dose to 20 mg/day. Continue statin therapy Continue Eliquis   Blood work today will include TSH, CBC, basic metabolic panel, BNP.  On next visit we need to consider performing a digoxin level.   Medication Adjustments/Labs and Tests Ordered: Current medicines are reviewed at length with the patient today.  Concerns regarding medicines are outlined above.  No orders of the defined types were placed in this encounter.  No orders of the defined types were placed in this encounter.   Patient Instructions  Medication Instructions:  Your physician has recommended you make the following change in your medication:   1) START Digoxin (Lanoxin) 0.125mg  daily for 1 week, then take half tablet daily  *If you need a refill on your cardiac medications before your next appointment, please call your pharmacy*  Lab Work: TODAY: CBC, BMET, BNP, TSH If you have labs (blood work) drawn today and your tests are completely normal, you will receive your results only by: MyChart Message (if you have MyChart) OR A paper copy in the mail If you have any lab test that is abnormal or we need to change your treatment, we will call you to review the results.  Testing/Procedures: NONE  Follow-Up: At [facility], you and your health needs are our priority.  As part of our continuing mission to provide you with exceptional heart care, we have created designated Provider Care  Teams.  These Care Teams include your primary Cardiologist (physician) and Advanced Practice Providers (APPs -  Physician Assistants and Nurse Practitioners) who all work together to provide you with the care you need, when you need it.  Your next appointment:   1 month(s)  The format for your next appointment:   In Person  Provider:   Lesleigh Noe, MD {  Important Information About Sugar         Signed, Lesleigh NoeHenry W  III, MD  09/05/2021 12:29 PM    [facility]

## 2021-09-05 ENCOUNTER — Encounter: Payer: Self-pay | Admitting: Interventional Cardiology

## 2021-09-05 ENCOUNTER — Ambulatory Visit (INDEPENDENT_AMBULATORY_CARE_PROVIDER_SITE_OTHER): Payer: Medicare Other | Admitting: Interventional Cardiology

## 2021-09-05 VITALS — BP 112/84 | HR 96 | Ht 71.0 in | Wt 190.0 lb

## 2021-09-05 DIAGNOSIS — E785 Hyperlipidemia, unspecified: Secondary | ICD-10-CM

## 2021-09-05 DIAGNOSIS — I7121 Aneurysm of the ascending aorta, without rupture: Secondary | ICD-10-CM | POA: Diagnosis not present

## 2021-09-05 DIAGNOSIS — I2510 Atherosclerotic heart disease of native coronary artery without angina pectoris: Secondary | ICD-10-CM | POA: Diagnosis not present

## 2021-09-05 DIAGNOSIS — Z7901 Long term (current) use of anticoagulants: Secondary | ICD-10-CM | POA: Diagnosis not present

## 2021-09-05 DIAGNOSIS — I77819 Aortic ectasia, unspecified site: Secondary | ICD-10-CM

## 2021-09-05 DIAGNOSIS — E1169 Type 2 diabetes mellitus with other specified complication: Secondary | ICD-10-CM

## 2021-09-05 DIAGNOSIS — I10 Essential (primary) hypertension: Secondary | ICD-10-CM | POA: Diagnosis not present

## 2021-09-05 DIAGNOSIS — I4819 Other persistent atrial fibrillation: Secondary | ICD-10-CM

## 2021-09-05 DIAGNOSIS — I5032 Chronic diastolic (congestive) heart failure: Secondary | ICD-10-CM | POA: Diagnosis not present

## 2021-09-05 DIAGNOSIS — Z1329 Encounter for screening for other suspected endocrine disorder: Secondary | ICD-10-CM | POA: Diagnosis not present

## 2021-09-05 DIAGNOSIS — I480 Paroxysmal atrial fibrillation: Secondary | ICD-10-CM

## 2021-09-05 MED ORDER — DIGOXIN 125 MCG PO TABS: 0.1250 mg | ORAL_TABLET | Freq: Every day | ORAL | 3 refills | Status: DC

## 2021-09-05 NOTE — Patient Instructions (Signed)
Medication Instructions:  Your physician has recommended you make the following change in your medication:   1) START Digoxin (Lanoxin) 0.125mg  daily for 1 week, then take half tablet daily  *If you need a refill on your cardiac medications before your next appointment, please call your pharmacy*  Lab Work: TODAY: CBC, BMET, BNP, TSH If you have labs (blood work) drawn today and your tests are completely normal, you will receive your results only by: MyChart Message (if you have MyChart) OR A paper copy in the mail If you have any lab test that is abnormal or we need to change your treatment, we will call you to review the results.  Testing/Procedures: NONE  Follow-Up: At [facility], you and your health needs are our priority.  As part of our continuing mission to provide you with exceptional heart care, we have created designated Provider Care Teams.  These Care Teams include your primary Cardiologist (physician) and Advanced Practice Providers (APPs -  Physician Assistants and Nurse Practitioners) who all work together to provide you with the care you need, when you need it.  Your next appointment:   1 month(s)  The format for your next appointment:   In Person  Provider:   Lesleigh Noe, MD {   Important Information About Sugar

## 2021-09-06 LAB — CBC
Hematocrit: 41.5 % (ref 37.5–51.0)
Hemoglobin: 14.6 g/dL (ref 13.0–17.7)
MCH: 31 pg (ref 26.6–33.0)
MCHC: 35.2 g/dL (ref 31.5–35.7)
MCV: 88 fL (ref 79–97)
Platelets: 230 10*3/uL (ref 150–450)
RBC: 4.71 x10E6/uL (ref 4.14–5.80)
RDW: 12.1 % (ref 11.6–15.4)
WBC: 10.4 10*3/uL (ref 3.4–10.8)

## 2021-09-06 LAB — BASIC METABOLIC PANEL
BUN/Creatinine Ratio: 14 (ref 10–24)
BUN: 16 mg/dL (ref 8–27)
CO2: 27 mmol/L (ref 20–29)
Calcium: 9.8 mg/dL (ref 8.6–10.2)
Chloride: 101 mmol/L (ref 96–106)
Creatinine, Ser: 1.17 mg/dL (ref 0.76–1.27)
Glucose: 96 mg/dL (ref 70–99)
Potassium: 4.2 mmol/L (ref 3.5–5.2)
Sodium: 142 mmol/L (ref 134–144)
eGFR: 63 mL/min/{1.73_m2} (ref >59)

## 2021-09-06 LAB — TSH: TSH: 2.9 u[IU]/mL (ref 0.450–4.500)

## 2021-09-06 LAB — PRO B NATRIURETIC PEPTIDE: NT-Pro BNP: 2550 pg/mL — ABNORMAL HIGH (ref 0–486)

## 2021-09-10 ENCOUNTER — Telehealth: Payer: Self-pay

## 2021-09-10 DIAGNOSIS — I4819 Other persistent atrial fibrillation: Secondary | ICD-10-CM

## 2021-09-10 DIAGNOSIS — Z79899 Other long term (current) drug therapy: Secondary | ICD-10-CM

## 2021-09-10 DIAGNOSIS — I5032 Chronic diastolic (congestive) heart failure: Secondary | ICD-10-CM

## 2021-09-10 DIAGNOSIS — I480 Paroxysmal atrial fibrillation: Secondary | ICD-10-CM

## 2021-09-10 MED ORDER — DAPAGLIFLOZIN PROPANEDIOL 10 MG PO TABS: 10.0000 mg | ORAL_TABLET | Freq: Every day | ORAL | 11 refills | Status: DC

## 2021-09-10 NOTE — Telephone Encounter (Signed)
-----   Message from Lyn Records, MD sent at 09/09/2021 12:17 PM EDT ----- Let the patient know the labs suggest heart failure.  In addition to the Lanoxin that was started, please start Farxiga or Jardiance 10 mg/day.  Basic metabolic panel on return in 1 month.  Also do a digoxin level at that time. A copy will be sent to Patrcia Dolly, DO

## 2021-09-10 NOTE — Telephone Encounter (Signed)
Spoke with patient and discussed lab results.  Per Katrinka Blazing: Let the patient know the labs suggest heart failure.  In addition to the Lanoxin that was started, please start Farxiga or Jardiance 10 mg/day.  Basic metabolic panel on return in 1 month.  Also do a digoxin level at that time.  Patient states he does not have a drug plan and likely will not be able to afford [place] long term. Advised patient we can provide 30 day sample and 30-day free co-pay card for first refill. We also have a form with contact information to inquire about patient assistance.  Patient requested I call and speak with his daughter Britta Mccreedy. Spoke with Britta Mccreedy and discussed the above. She states she will come by and pick up samples this week and look into patient assistance for [place].  Farxiga 10mg  QD samples will be left up front for pick-up.  Will obtain BMET and digoxin level at OV appt on 10/11/21.

## 2021-09-11 ENCOUNTER — Telehealth: Payer: Self-pay | Admitting: Interventional Cardiology

## 2021-09-11 MED ORDER — DAPAGLIFLOZIN PROPANEDIOL 10 MG PO TABS: 10.0000 mg | ORAL_TABLET | Freq: Every day | ORAL | 11 refills | Status: DC

## 2021-09-11 NOTE — Telephone Encounter (Signed)
Spoke with Pamala Hurry (daughter), and informed her that I will print a prescription for Wilder Glade and have it signed by Dr. Tamala Julian when he is back in the office tomorrow (09/12/21) and will fax to [employer]. Pamala Hurry verbalized understanding and expressed appreciation for call.

## 2021-09-11 NOTE — Telephone Encounter (Signed)
Patient's daughter called stating a script for [name] needs to be sent to [facility] at [phone].

## 2021-09-12 MED ORDER — DAPAGLIFLOZIN PROPANEDIOL 10 MG PO TABS: 10.0000 mg | ORAL_TABLET | Freq: Every day | ORAL | 11 refills | Status: DC

## 2021-09-12 NOTE — Telephone Encounter (Signed)
Signed Farxiga prescription faxed to [facility] at [phone].

## 2021-09-12 NOTE — Addendum Note (Signed)
Addended by: Franchot Gallo on: 09/12/2021 09:29 AM   Modules accepted: Orders

## 2021-09-19 DIAGNOSIS — M19041 Primary osteoarthritis, right hand: Secondary | ICD-10-CM | POA: Diagnosis not present

## 2021-09-19 DIAGNOSIS — M170 Bilateral primary osteoarthritis of knee: Secondary | ICD-10-CM | POA: Diagnosis not present

## 2021-09-30 ENCOUNTER — Telehealth: Payer: Self-pay

## 2021-09-30 NOTE — Telephone Encounter (Signed)
MEDS HAVE BEEN SHIPMENT FROM [facility]. ID# [id number]. TRACKING NUMBER [phone]

## 2021-10-01 DIAGNOSIS — H43813 Vitreous degeneration, bilateral: Secondary | ICD-10-CM | POA: Diagnosis not present

## 2021-10-01 DIAGNOSIS — H34812 Central retinal vein occlusion, left eye, with macular edema: Secondary | ICD-10-CM | POA: Diagnosis not present

## 2021-10-08 ENCOUNTER — Ambulatory Visit: Payer: Medicare Other | Admitting: Interventional Cardiology

## 2021-10-09 NOTE — Progress Notes (Signed)
Cardiology Office Note:    Date:  10/11/2021   ID:  Jason [name], DOB [birth year], MRN [id number]  PCP:  Eunice Blase, PA-C  Cardiologist:  Lesleigh Noe, MD   Referring MD: Eunice Blase, PA-C   Chief Complaint  Patient presents with   Atrial Fibrillation   Congestive Heart Failure    History of Present Illness:    Jason [name] is a 81 y.o. male with a hx of acute on chronic systolic heart failure (LVEF by echo 20-25%), atrial fibrillation of unknown duration, hypertension, and silent coronary disease denoted by calcium on CT scan. Recent cardiac catheterization demonstrated moderate coronary disease and return of LVEF to 55%.   The patient is doing well.  He feels so much better than he did when he had before.  He feels [name] is made a dramatic difference.  His only complaint now is that he is having very frequent urination.  He denies orthopnea.  He is ambulatory.  No chest pain.  Past Medical History:  Diagnosis Date   Atrial fibrillation (HCC)    Diabetes mellitus without complication (HCC)    Hypertension     Past Surgical History:  Procedure Laterality Date   CARDIAC CATHETERIZATION N/A 11/16/2015   Procedure: Left Heart Cath and Coronary Angiography;  Surgeon: Lyn Records, MD;  Location: [facility] INVASIVE CV LAB;  Service: Cardiovascular;  Laterality: N/A;   CARDIOVERSION N/A 08/28/2015   Procedure: CARDIOVERSION;  Surgeon: Lewayne Bunting, MD;  Location: [facility] ENDOSCOPY;  Service: Cardiovascular;  Laterality: N/A;   HERNIA REPAIR     KIDNEY STONE SURGERY      Current Medications: Current Meds  Medication Sig   acetaminophen (TYLENOL) 325 MG tablet Take 1-2 tablets (325-650 mg total) by mouth every 6 (six) hours as needed for mild pain, moderate pain, fever or headache.   apixaban (ELIQUIS) 5 MG TABS tablet Take 1 tablet (5 mg total) by mouth 2 (two) times daily.   atorvastatin (LIPITOR) 20 MG tablet Take 1 tablet (20 mg total) by mouth daily. Please keep upcoming  appointment in June for future refills. Thank you   carvedilol (COREG) 12.5 MG tablet Take 1 tablet by mouth twice daily   cetirizine (ZYRTEC) 10 MG tablet Take 10 mg by mouth daily as needed for allergies.   dapagliflozin propanediol (FARXIGA) 10 MG TABS tablet Take 1 tablet (10 mg total) by mouth daily before breakfast.   digoxin (LANOXIN) 0.125 MG tablet Take 1 tablet (0.125 mg total) by mouth daily. Take 1 tablet daily for 1 week, then take half tablet (0.0625mg ) daily.   furosemide (LASIX) 40 MG tablet Take 1 tablet by mouth once daily   glipiZIDE (GLUCOTROL XL) 2.5 MG 24 hr tablet Take 2.5 mg by mouth daily.   metFORMIN (GLUCOPHAGE) 1000 MG tablet Take 1,000 mg by mouth 2 (two) times daily.   Multiple Vitamins-Minerals (CENTRUM SILVER 50+MEN) TABS Take 1 tablet by mouth daily.     Allergies:   Patient has no known allergies.   Social History   Socioeconomic History   Marital status: Married    Spouse name: Not on file   Number of children: Not on file   Years of education: Not on file   Highest education level: Not on file  Occupational History   Not on file  Tobacco Use   Smoking status: Never   Smokeless tobacco: Never  Vaping Use   Vaping Use: Never used  Substance and Sexual Activity  Alcohol use: No   Drug use: No   Sexual activity: Never  Other Topics Concern   Not on file  Social History Narrative   Not on file   Social Determinants of Health   Financial Resource Strain: Not on file  Food Insecurity: Not on file  Transportation Needs: Not on file  Physical Activity: Not on file  Stress: Not on file  Social Connections: Not on file     Family History: The patient's family history includes Cancer in his father; Heart failure in his mother. There is no history of Heart attack or Hypertension.  ROS:   Please see the history of present illness.    Frequent urination is only complaint all other systems reviewed and are negative.  EKGs/Labs/Other Studies  Reviewed:    The following studies were reviewed today: CT CHEST 4.25.2023: IMPRESSION: Grossly stable 4.1 cm ascending thoracic aortic aneurysm. Recommend annual imaging followup by CTA or MRA. This recommendation follows 2010 ACCF/AHA/AATS/ACR/ASA/SCA/SCAI/SIR/STS/SVM Guidelines for the Diagnosis and Management of Patients with Thoracic Aortic Disease. Circulation. 2010; 121: M094-B096. Aortic aneurysm NOS (ICD10-I71.9).   Cholelithiasis.   Aortic Atherosclerosis (ICD10-I70.0).  EKG:  EKG not repeated  Recent Labs: 09/05/2021: BUN 16; Creatinine, Ser 1.17; Hemoglobin 14.6; NT-Pro BNP 2,550; Platelets 230; Potassium 4.2; Sodium 142; TSH 2.900  Recent Lipid Panel    Component Value Date/Time   CHOL 82 (L) 09/19/2020 0938   TRIG 65 09/19/2020 0938   HDL 29 (L) 09/19/2020 0938   CHOLHDL 2.8 09/19/2020 0938   CHOLHDL 4.3 07/29/2015 1857   VLDL 12 07/29/2015 1857   LDLCALC 38 09/19/2020 0938    Physical Exam:    VS:  BP 116/86   Pulse 86   Ht 5\' 11"  (1.803 m)   Wt 179 lb 3.2 oz (81.3 kg)   SpO2 94%   BMI 24.99 kg/m     Wt Readings from Last 3 Encounters:  10/11/21 179 lb 3.2 oz (81.3 kg)  09/05/21 190 lb (86.2 kg)  07/25/21 193 lb 3.2 oz (87.6 kg)     GEN: The weight is now down 15 pounds compared to April.  11 pounds down compared to May 25.  May 27 was started at that May appointment.. No acute distress HEENT: Normal NECK: No JVD. LYMPHATICS: No lymphadenopathy CARDIAC: No murmur. IIRR no gallop, or edema. VASCULAR:  Normal Pulses. No bruits. RESPIRATORY:  Clear to auscultation without rales, wheezing or rhonchi  ABDOMEN: Soft, non-tender, non-distended, No pulsatile mass, MUSCULOSKELETAL: No deformity  SKIN: Warm and dry NEUROLOGIC:  Alert and oriented x 3 PSYCHIATRIC:  Normal affect   ASSESSMENT:    1. Chronic diastolic heart failure (HCC)   2. Persistent atrial fibrillation (HCC)   3. Coronary artery calcification seen on CT scan   4. Essential  hypertension   5. Hyperlipidemia associated with type 2 diabetes mellitus (HCC)   6. Chronic anticoagulation   7. Aneurysm of ascending aorta without rupture (HCC)    PLAN:    In order of problems listed above:  Resolved volume overload.  June has made a great improvement.  Since starting Farxiga I believe we can decrease furosemide to 20 mg/day.  Metabolic panel today. Check digoxin level. No comment. Blood pressure is somewhat soft.  Decrease furosemide to 20 mg/day.  Continue to monitor weight. Did not discuss Continue Eliquis Did not discuss   Medication Adjustments/Labs and Tests Ordered: Current medicines are reviewed at length with the patient today.  Concerns regarding medicines are outlined above.  No orders of the defined types were placed in this encounter.  No orders of the defined types were placed in this encounter.   Patient Instructions  Medication Instructions:  Your physician has recommended you make the following change in your medication:   1) DECREASE Furosemide (Lasix) to 20mg  daily  *If you need a refill on your cardiac medications before your next appointment, please call your pharmacy*  Lab Work: TODAY: BMET, Digoxin level If you have labs (blood work) drawn today and your tests are completely normal, you will receive your results only by: Success (if you have MyChart) OR A paper copy in the mail If you have any lab test that is abnormal or we need to change your treatment, we will call you to review the results.  Testing/Procedures: NONE  Follow-Up: At [facility], you and your health needs are our priority.  As part of our continuing mission to provide you with exceptional heart care, we have created designated Provider Care Teams.  These Care Teams include your primary Cardiologist (physician) and Advanced Practice Providers (APPs -  Physician Assistants and Nurse Practitioners) who all work together to provide you with the care  you need, when you need it.  Your next appointment:   6 month(s)  The format for your next appointment:   In Person  Provider:   Sinclair Grooms, MD {  Important Information About Sugar         Signed, Sinclair Grooms, MD  10/11/2021 10:45 AM    [place]

## 2021-10-11 ENCOUNTER — Encounter: Payer: Self-pay | Admitting: Interventional Cardiology

## 2021-10-11 ENCOUNTER — Ambulatory Visit (INDEPENDENT_AMBULATORY_CARE_PROVIDER_SITE_OTHER): Payer: Medicare Other | Admitting: Interventional Cardiology

## 2021-10-11 ENCOUNTER — Other Ambulatory Visit: Payer: Medicare Other

## 2021-10-11 VITALS — BP 116/86 | HR 86 | Ht 71.0 in | Wt 179.2 lb

## 2021-10-11 DIAGNOSIS — I5032 Chronic diastolic (congestive) heart failure: Secondary | ICD-10-CM | POA: Diagnosis not present

## 2021-10-11 DIAGNOSIS — I4819 Other persistent atrial fibrillation: Secondary | ICD-10-CM | POA: Diagnosis not present

## 2021-10-11 DIAGNOSIS — I10 Essential (primary) hypertension: Secondary | ICD-10-CM | POA: Diagnosis not present

## 2021-10-11 DIAGNOSIS — Z79899 Other long term (current) drug therapy: Secondary | ICD-10-CM | POA: Diagnosis not present

## 2021-10-11 DIAGNOSIS — E785 Hyperlipidemia, unspecified: Secondary | ICD-10-CM

## 2021-10-11 DIAGNOSIS — E1169 Type 2 diabetes mellitus with other specified complication: Secondary | ICD-10-CM

## 2021-10-11 DIAGNOSIS — I480 Paroxysmal atrial fibrillation: Secondary | ICD-10-CM

## 2021-10-11 DIAGNOSIS — Z7901 Long term (current) use of anticoagulants: Secondary | ICD-10-CM

## 2021-10-11 DIAGNOSIS — I2510 Atherosclerotic heart disease of native coronary artery without angina pectoris: Secondary | ICD-10-CM | POA: Diagnosis not present

## 2021-10-11 DIAGNOSIS — I7121 Aneurysm of the ascending aorta, without rupture: Secondary | ICD-10-CM | POA: Diagnosis not present

## 2021-10-11 MED ORDER — FUROSEMIDE 20 MG PO TABS: 20.0000 mg | ORAL_TABLET | Freq: Every day | ORAL | 3 refills | Status: DC

## 2021-10-11 NOTE — Patient Instructions (Addendum)
Medication Instructions:  Your physician has recommended you make the following change in your medication:   1) DECREASE Furosemide (Lasix) to 20mg  daily  *If you need a refill on your cardiac medications before your next appointment, please call your pharmacy*  Lab Work: TODAY: BMET, Digoxin level If you have labs (blood work) drawn today and your tests are completely normal, you will receive your results only by: MyChart Message (if you have MyChart) OR A paper copy in the mail If you have any lab test that is abnormal or we need to change your treatment, we will call you to review the results.  Testing/Procedures: NONE  Follow-Up: At [facility], you and your health needs are our priority.  As part of our continuing mission to provide you with exceptional heart care, we have created designated Provider Care Teams.  These Care Teams include your primary Cardiologist (physician) and Advanced Practice Providers (APPs -  Physician Assistants and Nurse Practitioners) who all work together to provide you with the care you need, when you need it.  Your next appointment:   6 month(s) Please call our office at [phone] in September or October to schedule your follow-up appointment with Dr. November due in January 2024.  The format for your next appointment:   In Person  Provider:   February 2024, MD {  Other Instructions Please be sure to weigh yourself every morning (after using the bathroom and in similar clothing). Call Dr. Lesleigh Noe office at [phone] if you have a weight gain of 3 lbs overnight or 5 lbs in 1 week.  Important Information About Sugar

## 2021-10-12 LAB — BASIC METABOLIC PANEL
BUN/Creatinine Ratio: 21 (ref 10–24)
BUN: 25 mg/dL (ref 8–27)
CO2: 26 mmol/L (ref 20–29)
Calcium: 9.6 mg/dL (ref 8.6–10.2)
Chloride: 99 mmol/L (ref 96–106)
Creatinine, Ser: 1.21 mg/dL (ref 0.76–1.27)
Glucose: 98 mg/dL (ref 70–99)
Potassium: 4.2 mmol/L (ref 3.5–5.2)
Sodium: 140 mmol/L (ref 134–144)
eGFR: 60 mL/min/{1.73_m2} (ref >59)

## 2021-10-12 LAB — DIGOXIN LEVEL: Digoxin, Serum: 0.6 ng/mL (ref 0.5–0.9)

## 2021-10-31 DIAGNOSIS — Z79899 Other long term (current) drug therapy: Secondary | ICD-10-CM | POA: Diagnosis not present

## 2021-10-31 DIAGNOSIS — I4891 Unspecified atrial fibrillation: Secondary | ICD-10-CM | POA: Diagnosis not present

## 2021-10-31 DIAGNOSIS — E1169 Type 2 diabetes mellitus with other specified complication: Secondary | ICD-10-CM | POA: Diagnosis not present

## 2021-10-31 DIAGNOSIS — Z6824 Body mass index (BMI) 24.0-24.9, adult: Secondary | ICD-10-CM | POA: Diagnosis not present

## 2021-10-31 DIAGNOSIS — E785 Hyperlipidemia, unspecified: Secondary | ICD-10-CM | POA: Diagnosis not present

## 2021-10-31 DIAGNOSIS — I509 Heart failure, unspecified: Secondary | ICD-10-CM | POA: Diagnosis not present

## 2021-11-01 ENCOUNTER — Other Ambulatory Visit: Payer: Self-pay | Admitting: Interventional Cardiology

## 2021-11-26 DIAGNOSIS — H3562 Retinal hemorrhage, left eye: Secondary | ICD-10-CM | POA: Diagnosis not present

## 2021-11-26 DIAGNOSIS — H35371 Puckering of macula, right eye: Secondary | ICD-10-CM | POA: Diagnosis not present

## 2021-11-26 DIAGNOSIS — H43813 Vitreous degeneration, bilateral: Secondary | ICD-10-CM | POA: Diagnosis not present

## 2021-11-26 DIAGNOSIS — H34812 Central retinal vein occlusion, left eye, with macular edema: Secondary | ICD-10-CM | POA: Diagnosis not present

## 2021-11-28 ENCOUNTER — Other Ambulatory Visit: Payer: Self-pay | Admitting: *Deleted

## 2021-11-28 NOTE — Patient Outreach (Addendum)
  Care Coordination   11/28/2021 Name: Jason [name] MRN: [id number] DOB: Jan 28, 1941   Care Coordination Outreach Attempts:  An unsuccessful telephone outreach was attempted today to offer the patient information about available care coordination services as a benefit of their health plan.   Follow Up Plan:  Additional outreach attempts will be made to offer the patient care coordination information and services.   Encounter Outcome:  No Answer  Care Coordination Interventions Activated:  No   Care Coordination Interventions:  No, not indicated     C. Burgess Estelle, MSN, GNP-BC Gerontological Nurse Practitioner [facility] Care Management [phone]  ADDENDUM: [name] returned call today. He reports he has had all his Medicare annual services with his primary office and no care management needs.  Zara Council. Burgess Estelle, MSN, [facility] Gerontological Nurse Practitioner [facility] Care Management [phone]

## 2022-01-28 DIAGNOSIS — H43813 Vitreous degeneration, bilateral: Secondary | ICD-10-CM | POA: Diagnosis not present

## 2022-01-28 DIAGNOSIS — H35371 Puckering of macula, right eye: Secondary | ICD-10-CM | POA: Diagnosis not present

## 2022-01-28 DIAGNOSIS — H34812 Central retinal vein occlusion, left eye, with macular edema: Secondary | ICD-10-CM | POA: Diagnosis not present

## 2022-01-28 DIAGNOSIS — H3562 Retinal hemorrhage, left eye: Secondary | ICD-10-CM | POA: Diagnosis not present

## 2022-02-13 DIAGNOSIS — Z23 Encounter for immunization: Secondary | ICD-10-CM | POA: Diagnosis not present

## 2022-02-16 ENCOUNTER — Other Ambulatory Visit: Payer: Self-pay | Admitting: Interventional Cardiology

## 2022-03-11 ENCOUNTER — Telehealth: Payer: Self-pay

## 2022-03-11 NOTE — Telephone Encounter (Signed)
Patient assistance form signed and faxed to [employer]. 

## 2022-03-11 NOTE — Telephone Encounter (Signed)
**Note De-Identified  Obfuscation** We received a [place] RX form from [employer].  I have completed the form with Dr Lonn Georgia information for Farxiga 10 mg #90 with 3 refills. I have e-mailed the form to Dr Michaelle Copas nurse so she can obtain his signature, date it, and to then fax it to [facility] [employer] at the fax number circled at the top of the form [phone]).

## 2022-03-12 NOTE — Telephone Encounter (Deleted)
**Note De-Identified  Obfuscation** A Farxiga RX request form received from [employer]. I have competed the form and have e-mailed it to Dr Michaelle Copas nurse so she can obtain his signature, date it, and to then fax all to [facility] at the fax number circled at the top of the page at [phone].

## 2022-03-31 DIAGNOSIS — H43813 Vitreous degeneration, bilateral: Secondary | ICD-10-CM | POA: Diagnosis not present

## 2022-03-31 DIAGNOSIS — H35371 Puckering of macula, right eye: Secondary | ICD-10-CM | POA: Diagnosis not present

## 2022-03-31 DIAGNOSIS — H34812 Central retinal vein occlusion, left eye, with macular edema: Secondary | ICD-10-CM | POA: Diagnosis not present

## 2022-04-07 NOTE — Progress Notes (Unsigned)
Office Visit    Patient Name: Jason [name] Date of Encounter: 04/09/2022  Primary Care Provider:  Eunice Blase, PA-C Primary Cardiologist:  Lesleigh Noe, MD Primary Electrophysiologist: None  Chief Complaint    Jason [name] is a 81 y.o. male with PMH of chronic combined systolic diastolic CHF, atrial fibrillation, HTN, DM type II, aortic dilation (41 mm), HLD who presents today for 80-month follow-up.  Past Medical History    Past Medical History:  Diagnosis Date   Atrial fibrillation (HCC)    Diabetes mellitus without complication (HCC)    Hypertension    Past Surgical History:  Procedure Laterality Date   CARDIAC CATHETERIZATION N/A 11/16/2015   Procedure: Left Heart Cath and Coronary Angiography;  Surgeon: Lyn Records, MD;  Location: [facility] INVASIVE CV LAB;  Service: Cardiovascular;  Laterality: N/A;   CARDIOVERSION N/A 08/28/2015   Procedure: CARDIOVERSION;  Surgeon: Lewayne Bunting, MD;  Location: [facility] ENDOSCOPY;  Service: Cardiovascular;  Laterality: N/A;   HERNIA REPAIR     KIDNEY STONE SURGERY      Allergies  No Known Allergies  History of Present Illness    Jason [name]  is a 81year old male with the above mention past medical history who presents today for 50-month follow-up.  Jason [name] was seen initially for consult in 2017 by Dr. Katrinka Blazing for new onset systolic CHF.  Patient had noted shortness of breath occurring over previous 6 months.  2D echo was completed with EF of 20-25%, moderate concentric LVH and diffuse hypokinesis.  He was diagnosed with atrial fibrillation 2 years prior.  He underwent DCCV in 08/2015 that was successful.  Cardiac CT was also completed to rule out PE that showed coronary calcifications.  He underwent nuclear stress test that showed 2 defects of PERI ischemia consistent with infarct.  He was referred for LHC that was completed and demonstrated noncritical atherosclerosis with eccentric mid LAD stenosis of 75% and 30% mid RCA stenosis.   EF on cath by estimate was 55-60%.  Medical therapy was intensified for treatment and GDMT was elevated with Coreg, beta-blocker, lisinopril.  He is currently on rate control with digoxin and Eliquis for NOAC.  He was last seen by Dr. Katrinka Blazing on  10/11/21 and reported doing well and tolerating Farxiga with only complaint being frequent urination.  His volume was euvolemic and blood pressure was somewhat soft and Lasix was decreased to 20 mg/day.   Jason [name] presents today for 35-month follow-up with his daughter.  Since last being seen in the office patient reports that he is doing well with no new cardiac complaints at this time.  His blood pressure is well-controlled at 112/78 and heart rate is 82 bpm.  He reports full compliance with his medications and denies any adverse reactions at this time.  He is euvolemic on exam and denies any indiscretions with excess salt.  He is staying active with family activities and currently is limited with outdoor activities due to increment weather. During our visit we discussed the importance of maintaining physical activity management of heart failure and cardiovascular disease. Patient denies chest pain, palpitations, dyspnea, PND, orthopnea, nausea, vomiting, dizziness, syncope, edema, weight gain, or early satiety.  Home Medications    Current Outpatient Medications  Medication Sig Dispense Refill   acetaminophen (TYLENOL) 325 MG tablet Take 1-2 tablets (325-650 mg total) by mouth every 6 (six) hours as needed for mild pain, moderate pain, fever or headache.  apixaban (ELIQUIS) 5 MG TABS tablet Take 1 tablet (5 mg total) by mouth 2 (two) times daily. 180 tablet 1   atorvastatin (LIPITOR) 20 MG tablet Take 1 tablet (20 mg total) by mouth daily. 90 tablet 3   carvedilol (COREG) 12.5 MG tablet Take 1 tablet by mouth twice daily 180 tablet 1   cetirizine (ZYRTEC) 10 MG tablet Take 10 mg by mouth daily as needed for allergies.     dapagliflozin propanediol (FARXIGA)  10 MG TABS tablet Take 1 tablet (10 mg total) by mouth daily before breakfast. 30 tablet 11   digoxin (LANOXIN) 0.125 MG tablet Take 1 tablet (0.125 mg total) by mouth daily. Take 1 tablet daily for 1 week, then take half tablet (0.0625mg ) daily. 90 tablet 3   erythromycin ophthalmic ointment 4 (four) times daily.     furosemide (LASIX) 20 MG tablet Take 1 tablet (20 mg total) by mouth daily. 90 tablet 3   glipiZIDE (GLUCOTROL XL) 2.5 MG 24 hr tablet Take 2.5 mg by mouth daily.     metFORMIN (GLUCOPHAGE) 1000 MG tablet Take 1,000 mg by mouth 2 (two) times daily.     Multiple Vitamins-Minerals (CENTRUM SILVER 50+MEN) TABS Take 1 tablet by mouth daily.     ONETOUCH ULTRA test strip daily.     No current facility-administered medications for this visit.     Review of Systems  Please see the history of present illness.     All other systems reviewed and are otherwise negative except as noted above.  Physical Exam    Wt Readings from Last 3 Encounters:  04/09/22 175 lb 6.4 oz (79.6 kg)  10/11/21 179 lb 3.2 oz (81.3 kg)  09/05/21 190 lb (86.2 kg)   VS: Vitals:   04/09/22 1022  BP: 112/78  Pulse: 67  SpO2: 98%  ,Body mass index is 24.46 kg/m.  Constitutional:      Appearance: Healthy appearance. Not in distress.  Neck:     Vascular: JVD normal.  Pulmonary:     Effort: Pulmonary effort is normal.     Breath sounds: No wheezing. No rales. Diminished in the bases Cardiovascular:     Irregularly irregular. Normal S1. Normal S2.      Murmurs: There is no murmur.  Edema:    Peripheral edema absent.  Abdominal:     Palpations: Abdomen is soft non tender. There is no hepatomegaly.  Skin:    General: Skin is warm and dry.  Neurological:     General: No focal deficit present.     Mental Status: Alert and oriented to person, place and time.     Cranial Nerves: Cranial nerves are intact.  EKG/LABS/Other Studies Reviewed    ECG personally reviewed by me today -atrial fibrillation  with rate of 82 bpm and no acute changes consistent with previous EKG.  Risk Assessment/Calculations:    CHA2DS2-VASc Score = 6   This indicates a 9.7% annual risk of stroke. The patient's score is based upon: CHF History: 1 HTN History: 1 Diabetes History: 1 Stroke History: 0 Vascular Disease History: 1 Age Score: 2 Gender Score: 0           Lab Results  Component Value Date   WBC 10.4 09/05/2021   HGB 14.6 09/05/2021   HCT 41.5 09/05/2021   MCV 88 09/05/2021   PLT 230 09/05/2021   Lab Results  Component Value Date   CREATININE 1.21 10/11/2021   BUN 25 10/11/2021   NA 140 10/11/2021  K 4.2 10/11/2021   CL 99 10/11/2021   CO2 26 10/11/2021   Lab Results  Component Value Date   ALT 8 09/19/2020   AST 13 09/19/2020   ALKPHOS 105 09/19/2020   BILITOT 1.2 09/19/2020   Lab Results  Component Value Date   CHOL 82 (L) 09/19/2020   HDL 29 (L) 09/19/2020   LDLCALC 38 09/19/2020   TRIG 65 09/19/2020   CHOLHDL 2.8 09/19/2020    Lab Results  Component Value Date   HGBA1C 7.2 (H) 07/29/2015    Assessment & Plan    1.  Chronic combined systolic diastolic CHF: -2D echo completed and 2022 with EF of 50-55% with global hypokinesis and mild LVH. -Today patient is euvolemic on exam and reports no indiscretions with salt. -Continue current GDMT with Farxiga, carvedilol 12.5 mg twice daily and Lasix 20 mg daily -Low sodium diet, fluid restriction <2L, and daily weights encouraged. Educated to contact our office for weight gain of 2 lbs overnight or 5 lbs in one week.   2.  Persistent atrial fibrillation: -Today patient is rate controlled atrial fibs at 82 bpm and is asymptomatic -Continue rate control with digoxin 0.125 mg daily -Patient's current dose of Eliquis 5 mg appropriate based on creatinine and age and weight. -CHA2DS2-VASc Score = 6 [CHF History: 1, HTN History: 1, Diabetes History: 1, Stroke History: 0, Vascular Disease History: 1, Age Score: 2, Gender  Score: 0].  Therefore, the patient's annual risk of stroke is 9.7 %.      3.  Essential hypertension: -Patient's blood pressure today was well-controlled at 112/78 -Continue carvedilol as noted above  4.  Nonobstructive CAD: -Patient denies any chest pain or anginal equivalent. -Continue GDMT with Lipitor 20 mg, carvedilol 12.5 mg twice daily  5.  Hyperlipidemia: -Patient's last LDL cholesterol was 48 10/2021 -Continue Lipitor as noted above   Disposition: Follow-up with Lyn Records III, MD or APP in 6 months    Medication Adjustments/Labs and Tests Ordered: Current medicines are reviewed at length with the patient today.  Concerns regarding medicines are outlined above.   Signed, Napoleon Form, Leodis Rains, NP 04/09/2022, 11:18 AM [facility]  Note:  This document was prepared using Dragon voice recognition software and may include unintentional dictation errors.

## 2022-04-09 ENCOUNTER — Encounter: Payer: Self-pay | Admitting: Nurse Practitioner

## 2022-04-09 ENCOUNTER — Ambulatory Visit: Payer: Medicare Other | Attending: Nurse Practitioner | Admitting: Nurse Practitioner

## 2022-04-09 VITALS — BP 112/78 | HR 67 | Ht 71.0 in | Wt 175.4 lb

## 2022-04-09 DIAGNOSIS — E785 Hyperlipidemia, unspecified: Secondary | ICD-10-CM | POA: Insufficient documentation

## 2022-04-09 DIAGNOSIS — I10 Essential (primary) hypertension: Secondary | ICD-10-CM | POA: Diagnosis not present

## 2022-04-09 DIAGNOSIS — I4819 Other persistent atrial fibrillation: Secondary | ICD-10-CM | POA: Diagnosis not present

## 2022-04-09 DIAGNOSIS — I5032 Chronic diastolic (congestive) heart failure: Secondary | ICD-10-CM | POA: Insufficient documentation

## 2022-04-09 DIAGNOSIS — I2510 Atherosclerotic heart disease of native coronary artery without angina pectoris: Secondary | ICD-10-CM | POA: Diagnosis not present

## 2022-04-09 DIAGNOSIS — E1169 Type 2 diabetes mellitus with other specified complication: Secondary | ICD-10-CM | POA: Diagnosis not present

## 2022-04-09 NOTE — Patient Instructions (Signed)
Medication Instructions:  Your physician recommends that you continue on your current medications as directed. Please refer to the Current Medication list given to you today.  *If you need a refill on your cardiac medications before your next appointment, please call your pharmacy*  Follow-Up: At [facility], you and your health needs are our priority.  As part of our continuing mission to provide you with exceptional heart care, we have created designated Provider Care Teams.  These Care Teams include your primary Cardiologist (physician) and Advanced Practice Providers (APPs -  Physician Assistants and Nurse Practitioners) who all work together to provide you with the care you need, when you need it.  Your next appointment:   6 month(s)  The format for your next appointment:   In Person  Provider:   Alverda Skeans, MD  Important Information About Sugar

## 2022-04-10 ENCOUNTER — Telehealth: Payer: Self-pay

## 2022-04-10 NOTE — Telephone Encounter (Signed)
**Note De-Identified  Obfuscation** The pts completed BMSPAF application was left at the office along with his med/allergy list.  I have completed the providers page of his application and have e-mailed all to Dr Michaelle Copas nurse so she can obtain our DOD, Dr Devin Going signature in Dr Michaelle Copas absence, to date it, and to then fax all to [facility] at the fax number written on the cover letter included.

## 2022-04-10 NOTE — Telephone Encounter (Signed)
Patient assistance paperwork for Eliquis signed by DOD Dr. Shari Prows and faxed to [facility].

## 2022-05-01 DIAGNOSIS — E785 Hyperlipidemia, unspecified: Secondary | ICD-10-CM | POA: Diagnosis not present

## 2022-05-01 DIAGNOSIS — Z6824 Body mass index (BMI) 24.0-24.9, adult: Secondary | ICD-10-CM | POA: Diagnosis not present

## 2022-05-01 DIAGNOSIS — I4891 Unspecified atrial fibrillation: Secondary | ICD-10-CM | POA: Diagnosis not present

## 2022-05-01 DIAGNOSIS — L03019 Cellulitis of unspecified finger: Secondary | ICD-10-CM | POA: Diagnosis not present

## 2022-05-01 DIAGNOSIS — E1169 Type 2 diabetes mellitus with other specified complication: Secondary | ICD-10-CM | POA: Diagnosis not present

## 2022-05-01 DIAGNOSIS — I509 Heart failure, unspecified: Secondary | ICD-10-CM | POA: Diagnosis not present

## 2022-05-01 DIAGNOSIS — Z79899 Other long term (current) drug therapy: Secondary | ICD-10-CM | POA: Diagnosis not present

## 2022-06-02 ENCOUNTER — Telehealth: Payer: Self-pay | Admitting: Internal Medicine

## 2022-06-02 NOTE — Telephone Encounter (Signed)
**Note De-Identified  Obfuscation** Pamala Hurry the pts daughter and DPR, is advised that I have the pts BMSPAF application for Eliquis that he signed on 04/08/2022 and that we are faxing to [facility] again today and that we will be sure that this copy is darker than the last application we faxed to [facility] in December. She is also aware that I am going to call them to make sure they received it and that it is readable.  She thanked me for calling her back to discuss.

## 2022-06-02 NOTE — Telephone Encounter (Signed)
Patient's daughter states she spoke with Jason [name] and they informed her that they received the patient assistance application but it was very light and they were unable to read it.

## 2022-06-03 NOTE — Telephone Encounter (Signed)
Received and faxed 06/02/22. Confirmation received.

## 2022-06-09 NOTE — Telephone Encounter (Signed)
**Note De-Identified  Obfuscation** Letter received  fax from [facility] stating that they approved the pt for Eliquis assistance until 04/14/2023. [id number]  The letter states that they have notified the pt of this approval as well.

## 2022-06-24 DIAGNOSIS — H35371 Puckering of macula, right eye: Secondary | ICD-10-CM | POA: Diagnosis not present

## 2022-06-24 DIAGNOSIS — H34812 Central retinal vein occlusion, left eye, with macular edema: Secondary | ICD-10-CM | POA: Diagnosis not present

## 2022-06-24 DIAGNOSIS — H43813 Vitreous degeneration, bilateral: Secondary | ICD-10-CM | POA: Diagnosis not present

## 2022-07-25 DIAGNOSIS — H1032 Unspecified acute conjunctivitis, left eye: Secondary | ICD-10-CM | POA: Diagnosis not present

## 2022-08-18 ENCOUNTER — Other Ambulatory Visit: Payer: Self-pay

## 2022-08-18 MED ORDER — CARVEDILOL 12.5 MG PO TABS: 12.5000 mg | ORAL_TABLET | Freq: Two times a day (BID) | ORAL | 2 refills | Status: DC

## 2022-08-27 DIAGNOSIS — M1711 Unilateral primary osteoarthritis, right knee: Secondary | ICD-10-CM | POA: Diagnosis not present

## 2022-09-16 DIAGNOSIS — H35371 Puckering of macula, right eye: Secondary | ICD-10-CM | POA: Diagnosis not present

## 2022-09-16 DIAGNOSIS — H43813 Vitreous degeneration, bilateral: Secondary | ICD-10-CM | POA: Diagnosis not present

## 2022-09-16 DIAGNOSIS — H34812 Central retinal vein occlusion, left eye, with macular edema: Secondary | ICD-10-CM | POA: Diagnosis not present

## 2022-10-06 ENCOUNTER — Other Ambulatory Visit: Payer: Self-pay

## 2022-10-06 MED ORDER — FUROSEMIDE 20 MG PO TABS: 20.0000 mg | ORAL_TABLET | Freq: Every day | ORAL | 1 refills | Status: DC

## 2022-10-08 DIAGNOSIS — M24811 Other specific joint derangements of right shoulder, not elsewhere classified: Secondary | ICD-10-CM | POA: Diagnosis not present

## 2022-10-23 ENCOUNTER — Other Ambulatory Visit: Payer: Self-pay

## 2022-10-23 MED ORDER — DIGOXIN 125 MCG PO TABS: 0.0625 mg | ORAL_TABLET | Freq: Every day | ORAL | 1 refills | Status: DC

## 2022-10-30 DIAGNOSIS — Z139 Encounter for screening, unspecified: Secondary | ICD-10-CM | POA: Diagnosis not present

## 2022-10-30 DIAGNOSIS — Z9181 History of falling: Secondary | ICD-10-CM | POA: Diagnosis not present

## 2022-10-30 DIAGNOSIS — Z6824 Body mass index (BMI) 24.0-24.9, adult: Secondary | ICD-10-CM | POA: Diagnosis not present

## 2022-10-30 DIAGNOSIS — E1169 Type 2 diabetes mellitus with other specified complication: Secondary | ICD-10-CM | POA: Diagnosis not present

## 2022-10-30 DIAGNOSIS — I509 Heart failure, unspecified: Secondary | ICD-10-CM | POA: Diagnosis not present

## 2022-10-30 DIAGNOSIS — I4891 Unspecified atrial fibrillation: Secondary | ICD-10-CM | POA: Diagnosis not present

## 2022-10-30 DIAGNOSIS — Z79899 Other long term (current) drug therapy: Secondary | ICD-10-CM | POA: Diagnosis not present

## 2022-10-30 DIAGNOSIS — E785 Hyperlipidemia, unspecified: Secondary | ICD-10-CM | POA: Diagnosis not present

## 2022-11-11 ENCOUNTER — Other Ambulatory Visit: Payer: Self-pay

## 2022-11-11 MED ORDER — ATORVASTATIN CALCIUM 20 MG PO TABS: 20.0000 mg | ORAL_TABLET | Freq: Every day | ORAL | 1 refills | Status: DC

## 2022-11-18 ENCOUNTER — Ambulatory Visit: Payer: Medicare Other | Admitting: Internal Medicine

## 2022-11-21 NOTE — Progress Notes (Unsigned)
Cardiology Office Note:   Date:  11/21/2022  ID:  Jason [name] [name], DOB [birth year], MRN [id number] PCP:  Eunice Blase, PA-C  [facility] Providers Cardiologist:  Alverda Skeans, MD Referring MD: Eunice Blase, PA-C   Chief Complaint/Reason for Referral:  Cardiology follow up ASSESSMENT:    1. Chronic diastolic heart failure (HCC)   2. Persistent atrial fibrillation (HCC)   3. Type 2 diabetes mellitus with complication, without long-term current use of insulin (HCC)   4. Hypertension associated with diabetes (HCC)   5. Hyperlipidemia associated with type 2 diabetes mellitus (HCC)   6. Coronary artery disease involving native coronary artery of native heart without angina pectoris   7. CKD (chronic kidney disease) stage 2, GFR 60-89 ml/min   8. Aneurysm of ascending aorta without rupture (HCC)     PLAN:   In order of problems listed above:  Chronic diastolic HF:  Continue Farxiga, Coreg, and Lasix.   AF:  Continue Eliquis; will d/c digoxin and uptitrate Coreg to 25mg  BID. T2DM:  Cont Eliquis (instead of ASA), Farxiga, atorvastatin, and consider losartan *** HTN:  *** Hyperlipidemia:  Check lipid panel and LFTs today. CAD:  Moderate, continue medical therapy. CKD stage II:  Continue Farxiga, consider losartan in the future. Thoracic aortic aneurysm:  Obtain CTA to evaluate.         {Are you ordering a CV Procedure (e.g. stress test, cath, DCCV, TEE, etc)?   Press F2        :[id number]}   Dispo:  No follow-ups on file.      Medication Adjustments/Labs and Tests Ordered: Current medicines are reviewed at length with the patient today.  Concerns regarding medicines are outlined above.  The following changes have been made:  {PLAN; NO CHANGE:13088:s}   Labs/tests ordered: No orders of the defined types were placed in this encounter.   Medication Changes: No orders of the defined types were placed in this encounter.   Current medicines are reviewed at length with the  patient today.  The patient {ACTIONS; HAS/DOES NOT HAVE:19233} concerns regarding medicines.  History of Present Illness:   FOCUSED PROBLEM LIST:   NICM (prior EF 20-25%) now resolved on medical therapy Noncritical coronary artery disease (cath 2017) PAF on Eliquis; CV2 score of 6 Hyperlipidemia T2DM not on insulin CKD stage 2 Thoracic aortic aneurysm Aortic atherosclerosis on CT 2022 BMI 24  The patient is a 82 y.o. male with the indicated medical history here for routine cardiology follow up.  The patient was last seen in 2023.  At that time, he was compliant with his medications and was doing well from a symptom standpoint.       Previous Medical History: Past Medical History:  Diagnosis Date   Atrial fibrillation (HCC)    Diabetes mellitus without complication (HCC)    Hypertension      Current Medications: No outpatient medications have been marked as taking for the 11/26/22 encounter (Office Visit) with Orbie Pyo, MD.     Allergies:    Patient has no known allergies.   Social History:   Social History   Tobacco Use   Smoking status: Never   Smokeless tobacco: Never  Vaping Use   Vaping status: Never Used  Substance Use Topics   Alcohol use: No   Drug use: No     Family Hx: Family History  Problem Relation Age of Onset   Heart failure Mother    Cancer Father    Heart attack Neg Hx  Hypertension Neg Hx      Review of Systems:   Please see the history of present illness.    All other systems reviewed and are negative.     EKGs/Labs/Other Test Reviewed:   EKG:    EKG Interpretation Date/Time:    Ventricular Rate:    PR Interval:    QRS Duration:    QT Interval:    QTC Calculation:   R Axis:      Text Interpretation:          Prior CV studies reviewed: Cardiac Studies & Procedures   CARDIAC CATHETERIZATION  CARDIAC CATHETERIZATION 11/16/2015  Narrative  Prox LAD lesion, 65 %stenosed.  Mid RCA lesion, 30 %stenosed.  1st  Mrg lesion, 75 %stenosed.  The left ventricular systolic function is normal.  LV end diastolic pressure is mildly elevated.  The left ventricular ejection fraction is 55-65% by visual estimate.   Noncritical coronary atherosclerosis with 60-70% calcified eccentric mid LAD stenosis. 75% stenosis in the midportion of a small first obtuse marginal, and 30% mid RCA.  Normal left ventricular function. Elevated left ventricular end-diastolic pressure compatible with diastolic dysfunction. EF 55-60%.  Apical ischemia felt secondary to borderline significant mid LAD. In absence of symptoms intensification of medical therapy seems most prudent approach.  RECOMMENDATIONS:  With improved LV function, further intensification of beta blocker therapy would be helpful for ischemic heart disease noted above. ACE inhibitor therapy is not mandatory.  Antiplatelet therapy.  Goal will be eventual discontinuation of amiodarone if no recurrences of atrial fib.  Long-term anticoagulation therapy with Eliquis may probably be necessary but will depend upon course and whether there is recurrent atrial fibrillation.  Aggressive risk factor modification.  Findings Coronary Findings Diagnostic  Dominance: Co-dominant  Left Anterior Descending Culprit lesion. The lesion is type C, segmental and eccentric. The lesion is moderately calcified.  Left Circumflex  First Obtuse Marginal Branch Vessel is small in size. The lesion is type C and eccentric.  Right Coronary Artery The lesion is type A.  Intervention  No interventions have been documented.   STRESS TESTS  MYOCARDIAL PERFUSION IMAGING 10/01/2015  Narrative  Nuclear stress EF: 57%.  There was no ST segment deviation noted during stress.  No T wave inversion was noted during stress.  Defect 1: There is a small defect of moderate severity present in the mid anteroseptal and apical septal location. Mostly infarct with very mild  peri-infarct ischemia.  Defect 2: There is a small defect of moderate severity present in the apical lateral location. This is consistent with infarct.  This is a low risk study.  The left ventricular ejection fraction is normal (55-65%).   ECHOCARDIOGRAM  ECHOCARDIOGRAM COMPLETE 09/19/2020  Narrative ECHOCARDIOGRAM REPORT    Patient Name:   Jason [name] [name] Date of Exam: 09/19/2020 Medical Rec #:  [id number]     Height:       71.0 in Accession #:    [id number]    Weight:       193.0 lb Date of Birth:  10-01-1940    BSA:          2.077 m Patient Age:    82 years      BP:           116/80 mmHg Patient Gender: M             HR:           107 bpm. Exam Location:  [address]  Procedure: 2D Echo,  Cardiac Doppler and Color Doppler  Indications:    I50.9 CHF  History:        Patient has prior history of Echocardiogram examinations, most recent 04/27/2018. CHF, Arrythmias:Atrial Fibrillation; Risk Factors:Hypertension, Family History of Coronary Artery Disease and Diabetes. Prior EF (55-60%), History of Cardioversion.  Sonographer:    Farrel Conners RDCS Referring Phys: [phone] Barry Dienes [facility]  IMPRESSIONS   1. Left ventricular ejection fraction, by estimation, is 50 to 55%. The left ventricle has low normal function. The left ventricle demonstrates global hypokinesis. There is mild concentric left ventricular hypertrophy. Left ventricular diastolic function could not be evaluated. 2. Right ventricular systolic function is normal. The right ventricular size is normal. There is mildly elevated pulmonary artery systolic pressure. The estimated right ventricular systolic pressure is 40.0 mmHg. 3. Left atrial size was severely dilated. 4. Right atrial size was severely dilated. 5. The mitral valve is normal in structure. Mild mitral valve regurgitation. 6. The aortic valve is tricuspid. Aortic valve regurgitation is not visualized. Mild aortic valve sclerosis is present, with no evidence of  aortic valve stenosis. 7. Aortic dilatation noted. There is moderate dilatation of the ascending aorta, measuring 46 mm. 8. The inferior vena cava is dilated in size with <50% respiratory variability, suggesting right atrial pressure of 15 mmHg.  Comparison(s): Prior images reviewed side by side. The left ventricular function is worsened. On the current study the rhyth is atrial fibrillation with ventricular rates 110-130 bpm.  FINDINGS Left Ventricle: Left ventricular ejection fraction, by estimation, is 50 to 55%. The left ventricle has low normal function. The left ventricle demonstrates global hypokinesis. The left ventricular internal cavity size was normal in size. There is mild concentric left ventricular hypertrophy. Abnormal (paradoxical) septal motion consistent with post-operative status. Left ventricular diastolic function could not be evaluated due to atrial fibrillation. Left ventricular diastolic function could not be evaluated.  Right Ventricle: The right ventricular size is normal. No increase in right ventricular wall thickness. Right ventricular systolic function is normal. There is mildly elevated pulmonary artery systolic pressure. The tricuspid regurgitant velocity is 2.50 m/s, and with an assumed right atrial pressure of 15 mmHg, the estimated right ventricular systolic pressure is 40.0 mmHg.  Left Atrium: Left atrial size was severely dilated.  Right Atrium: Right atrial size was severely dilated.  Pericardium: There is no evidence of pericardial effusion.  Mitral Valve: The mitral valve is normal in structure. Mild mitral valve regurgitation, with centrally-directed jet.  Tricuspid Valve: The tricuspid valve is normal in structure. Tricuspid valve regurgitation is trivial.  Aortic Valve: The aortic valve is tricuspid. Aortic valve regurgitation is not visualized. Aortic regurgitation PHT measures 207 msec. Mild aortic valve sclerosis is present, with no evidence of  aortic valve stenosis.  Pulmonic Valve: The pulmonic valve was not well visualized. Pulmonic valve regurgitation is not visualized.  Aorta: Aortic dilatation noted. There is moderate dilatation of the ascending aorta, measuring 46 mm.  Venous: The inferior vena cava is dilated in size with less than 50% respiratory variability, suggesting right atrial pressure of 15 mmHg.  IAS/Shunts: No atrial level shunt detected by color flow Doppler.   LEFT VENTRICLE PLAX 2D LVIDd:         4.40 cm  Diastology LVIDs:         3.00 cm  LV e' medial:    9.02 cm/s LV PW:         1.00 cm  LV E/e' medial:  9.8 LV IVS:  1.20 cm  LV e' lateral:   8.55 cm/s LVOT diam:     2.80 cm  LV E/e' lateral: 10.4 LV SV:         67 LV SV Index:   32 LVOT Area:     6.16 cm   RIGHT VENTRICLE RV S prime:     7.30 cm/s TAPSE (M-mode): 1.4 cm  LEFT ATRIUM           Index       RIGHT ATRIUM           Index LA diam:      4.20 cm 2.02 cm/m  RA Area:     30.10 cm LA Vol (A4C): 64.2 ml 30.91 ml/m RA Volume:   105.00 ml 50.56 ml/m AORTIC VALVE LVOT Vmax:   62.30 cm/s LVOT Vmean:  40.900 cm/s LVOT VTI:    0.108 m AI PHT:      207 msec  AORTA Ao Root diam: 4.10 cm Ao Asc diam:  4.60 cm  MITRAL VALVE               TRICUSPID VALVE MV Area (PHT): cm         TR Peak grad:   25.0 mmHg MV Decel Time: 142 msec    TR Vmax:        250.00 cm/s MR Peak grad: 111.1 mmHg MR Mean grad: 71.0 mmHg    SHUNTS MR Vmax:      527.00 cm/s  Systemic VTI:  0.11 m MR Vmean:     393.0 cm/s   Systemic Diam: 2.80 cm MV E velocity: 88.84 cm/s  Rachelle Hora Croitoru MD Electronically signed by Thurmon Fair MD Signature Date/Time: 09/19/2020/3:25:06 PM    Final             Recent Labs: No results found for requested labs within last 365 days.   Lipid Panel    Component Value Date/Time   CHOL 82 (L) 09/19/2020 0938   TRIG 65 09/19/2020 0938   HDL 29 (L) 09/19/2020 0938   CHOLHDL 2.8 09/19/2020 0938   CHOLHDL 4.3  07/29/2015 1857   VLDL 12 07/29/2015 1857   LDLCALC 38 09/19/2020 0938    Risk Assessment/Calculations:   {Does this patient have ATRIAL FIBRILLATION?:[id number]}      Physical Exam:   VS:  There were no vitals taken for this visit.   No BP recorded.  {Refresh Note OR Click here to enter BP  :1}***   Wt Readings from Last 3 Encounters:  04/09/22 175 lb 6.4 oz (79.6 kg)  10/11/21 179 lb 3.2 oz (81.3 kg)  09/05/21 190 lb (86.2 kg)      GENERAL:  No apparent distress, AOx3 HEENT:  No carotid bruits, +2 carotid impulses, no scleral icterus CAR: RRR Irregular RR*** no murmurs***, gallops, rubs, or thrills RES:  Clear to auscultation bilaterally ABD:  Soft, nontender, nondistended, positive bowel sounds x 4 VASC:  +2 radial pulses, +2 carotid pulses NEURO:  CN 2-12 grossly intact; motor and sensory grossly intact PSYCH:  No active depression or anxiety EXT:  No edema, ecchymosis, or cyanosis  Signed, Orbie Pyo, MD  11/21/2022 2:24 AM    [facility] [address] [place], [place], [place]  [address] Phone: [phone]; Fax: [phone]   Note:  This document was prepared using Dragon voice recognition software and may include unintentional dictation errors.

## 2022-11-26 ENCOUNTER — Ambulatory Visit: Payer: Medicare Other | Attending: Internal Medicine | Admitting: Internal Medicine

## 2022-11-26 ENCOUNTER — Encounter: Payer: Self-pay | Admitting: Internal Medicine

## 2022-11-26 VITALS — BP 110/66 | HR 79 | Ht 71.0 in | Wt 180.0 lb

## 2022-11-26 DIAGNOSIS — E785 Hyperlipidemia, unspecified: Secondary | ICD-10-CM | POA: Insufficient documentation

## 2022-11-26 DIAGNOSIS — I4819 Other persistent atrial fibrillation: Secondary | ICD-10-CM | POA: Insufficient documentation

## 2022-11-26 DIAGNOSIS — E1169 Type 2 diabetes mellitus with other specified complication: Secondary | ICD-10-CM | POA: Diagnosis not present

## 2022-11-26 DIAGNOSIS — I2510 Atherosclerotic heart disease of native coronary artery without angina pectoris: Secondary | ICD-10-CM | POA: Insufficient documentation

## 2022-11-26 DIAGNOSIS — E1159 Type 2 diabetes mellitus with other circulatory complications: Secondary | ICD-10-CM | POA: Diagnosis not present

## 2022-11-26 DIAGNOSIS — I5032 Chronic diastolic (congestive) heart failure: Secondary | ICD-10-CM | POA: Insufficient documentation

## 2022-11-26 DIAGNOSIS — I7121 Aneurysm of the ascending aorta, without rupture: Secondary | ICD-10-CM | POA: Insufficient documentation

## 2022-11-26 DIAGNOSIS — I152 Hypertension secondary to endocrine disorders: Secondary | ICD-10-CM | POA: Insufficient documentation

## 2022-11-26 DIAGNOSIS — Z7984 Long term (current) use of oral hypoglycemic drugs: Secondary | ICD-10-CM

## 2022-11-26 DIAGNOSIS — E118 Type 2 diabetes mellitus with unspecified complications: Secondary | ICD-10-CM | POA: Insufficient documentation

## 2022-11-26 DIAGNOSIS — N182 Chronic kidney disease, stage 2 (mild): Secondary | ICD-10-CM | POA: Insufficient documentation

## 2022-11-26 MED ORDER — CARVEDILOL 25 MG PO TABS: 25.0000 mg | ORAL_TABLET | Freq: Two times a day (BID) | ORAL | 3 refills | Status: DC

## 2022-11-26 NOTE — Patient Instructions (Addendum)
Medication Instructions:  Your physician has recommended you make the following change in your medication:  1-STOP Digoxin 2-INCREASE Carvedilol 25 mg by mouth twice daily.  *If you need a refill on your cardiac medications before your next appointment, please call your pharmacy*  Lab Work: If you have labs (blood work) drawn today and your tests are completely normal, you will receive your results only by: MyChart Message (if you have MyChart) OR A paper copy in the mail If you have any lab test that is abnormal or we need to change your treatment, we will call you to review the results.  Testing/Procedures: Your physician has requested that you have cardiac CT. Cardiac computed tomography (CT) is a painless test that uses an x-ray machine to take clear, detailed pictures of your heart. For further information please visit [email or web address] Please follow instruction sheet as given.  Follow-Up: At [facility], you and your health needs are our priority.  As part of our continuing mission to provide you with exceptional heart care, we have created designated Provider Care Teams.  These Care Teams include your primary Cardiologist (physician) and Advanced Practice Providers (APPs -  Physician Assistants and Nurse Practitioners) who all work together to provide you with the care you need, when you need it.  We recommend signing up for the patient portal called "MyChart".  Sign up information is provided on this After Visit Summary.  MyChart is used to connect with patients for Virtual Visits (Telemedicine).  Patients are able to view lab/test results, encounter notes, upcoming appointments, etc.  Non-urgent messages can be sent to your provider as well.   To learn more about what you can do with MyChart, go to [email or web address].    Your next appointment:   6 month(s)  Provider:   Jari Favre, PA-C, Ronie Spies, PA-C, Robin Searing, NP, Jacolyn Reedy, PA-C, Eligha Bridegroom, NP,  Tereso Newcomer, PA-C, or Perlie Gold, PA-C  or Orbie Pyo, MD

## 2022-12-02 DIAGNOSIS — H04122 Dry eye syndrome of left lacrimal gland: Secondary | ICD-10-CM | POA: Diagnosis not present

## 2022-12-02 DIAGNOSIS — H34812 Central retinal vein occlusion, left eye, with macular edema: Secondary | ICD-10-CM | POA: Diagnosis not present

## 2022-12-02 DIAGNOSIS — H35371 Puckering of macula, right eye: Secondary | ICD-10-CM | POA: Diagnosis not present

## 2022-12-02 DIAGNOSIS — H43813 Vitreous degeneration, bilateral: Secondary | ICD-10-CM | POA: Diagnosis not present

## 2022-12-04 ENCOUNTER — Telehealth: Payer: Self-pay | Admitting: Internal Medicine

## 2022-12-04 NOTE — Telephone Encounter (Signed)
Pt c/o medication issue:  1. Name of Medication: Carvedilol  2. How are you currently taking this medication (dosage and times per day)?   3. Are you having a reaction (difficulty breathing--STAT)?   4. What is your medication issue? Patient medicine was increased last week- no energy, blood pressure running low. Patient just does not feel well.h

## 2022-12-04 NOTE — Telephone Encounter (Signed)
Pt c/o medication issue:  1. Name of Medication: Carvedilol 2. How are you currently taking this medication (dosage and times per day)?   3. Are you having a reaction (difficulty breathing--STAT)?   4. What is your medication issue? Dr Lynnette Caffey increased this  this medicine last week. He have not been feeling well since he did this. He has no energy, blood pressure running low today- just does not feel well

## 2022-12-04 NOTE — Telephone Encounter (Signed)
BP today 90/62.  Pt feeling poorly - fatigue, weakness.   Last week digoxin was stopped and carvedilol increased 25 mg twice daily.  HR is 60s.  This evening he will increase fluids and have a small salty snack and hold hold the evening dose of carvedilol.  Tomorrow he will start back on 12.5 mg BID.  Daughter said he called her two times today while she was at work and he does not ever complain about anything.  She is aware I will forward to Dr. Lynnette Caffey to let him know, if there are other recommendations we will call her back.

## 2022-12-04 NOTE — Telephone Encounter (Signed)
I did not need this encounter. °

## 2022-12-05 ENCOUNTER — Ambulatory Visit (HOSPITAL_COMMUNITY)
Admission: RE | Admit: 2022-12-05 | Discharge: 2022-12-05 | Disposition: A | Discharge status: Home / Self Care | Payer: Medicare Other | Source: Ambulatory Visit | Attending: Internal Medicine | Admitting: Internal Medicine

## 2022-12-05 DIAGNOSIS — I7121 Aneurysm of the ascending aorta, without rupture: Secondary | ICD-10-CM | POA: Insufficient documentation

## 2022-12-05 DIAGNOSIS — E1159 Type 2 diabetes mellitus with other circulatory complications: Secondary | ICD-10-CM | POA: Insufficient documentation

## 2022-12-05 DIAGNOSIS — N182 Chronic kidney disease, stage 2 (mild): Secondary | ICD-10-CM | POA: Diagnosis not present

## 2022-12-05 DIAGNOSIS — I4819 Other persistent atrial fibrillation: Secondary | ICD-10-CM | POA: Insufficient documentation

## 2022-12-05 DIAGNOSIS — E785 Hyperlipidemia, unspecified: Secondary | ICD-10-CM | POA: Insufficient documentation

## 2022-12-05 DIAGNOSIS — I2510 Atherosclerotic heart disease of native coronary artery without angina pectoris: Secondary | ICD-10-CM | POA: Insufficient documentation

## 2022-12-05 DIAGNOSIS — E118 Type 2 diabetes mellitus with unspecified complications: Secondary | ICD-10-CM | POA: Insufficient documentation

## 2022-12-05 DIAGNOSIS — E1169 Type 2 diabetes mellitus with other specified complication: Secondary | ICD-10-CM | POA: Insufficient documentation

## 2022-12-05 DIAGNOSIS — I509 Heart failure, unspecified: Secondary | ICD-10-CM | POA: Diagnosis not present

## 2022-12-05 DIAGNOSIS — I152 Hypertension secondary to endocrine disorders: Secondary | ICD-10-CM | POA: Diagnosis not present

## 2022-12-05 DIAGNOSIS — I5032 Chronic diastolic (congestive) heart failure: Secondary | ICD-10-CM | POA: Diagnosis not present

## 2022-12-05 DIAGNOSIS — K8020 Calculus of gallbladder without cholecystitis without obstruction: Secondary | ICD-10-CM | POA: Diagnosis not present

## 2022-12-05 MED ORDER — IOHEXOL 350 MG/ML SOLN
75.0000 mL | Freq: Once | INTRAVENOUS | Status: AC | PRN
  Administered 2022-12-05: 75 mL via INTRAVENOUS

## 2022-12-08 ENCOUNTER — Telehealth: Payer: Self-pay

## 2022-12-08 DIAGNOSIS — I4819 Other persistent atrial fibrillation: Secondary | ICD-10-CM

## 2022-12-08 MED ORDER — METOPROLOL SUCCINATE ER 25 MG PO TB24: 12.5000 mg | ORAL_TABLET | Freq: Every day | ORAL | 3 refills | Status: DC

## 2022-12-08 NOTE — Telephone Encounter (Signed)
Patient states since you have increased his carvedilol his blood pressure has been bottoming out. He doesn't like how it makes him feel. Patient feels fine currently. He just want to know why the changes were made because he feels they are not helping. He also stated he no longer is taking carvedilol 25 mg BID because his blood pressure has been low. He went back to taking his carvedilol 12.5 mg BID. He states this helps him and he feels better onh te 12.5 mg BID. Patient wanted me to call his daughter Doristine Johns. I spoke with [place] and she gave me the following readings BP below are from yesterday.  BP 91/75 hr 70 93/69 hr 83 121/71 hr 66

## 2022-12-08 NOTE — Telephone Encounter (Signed)
Patient aware to d/c carvedilol and he will start toprol 12.5 mg at bedtime per Dr. Trula Ore recommendations. He verbalized understanding. He repeated instructions back to me

## 2022-12-08 NOTE — Telephone Encounter (Signed)
-----   Message from Orbie Pyo sent at 12/07/2022  7:54 AM EDT ----- CTA for thoracic aneurysm in 2 years.

## 2023-01-19 DIAGNOSIS — Z23 Encounter for immunization: Secondary | ICD-10-CM | POA: Diagnosis not present

## 2023-02-23 ENCOUNTER — Telehealth: Payer: Self-pay | Admitting: Physician Assistant

## 2023-02-23 NOTE — Telephone Encounter (Signed)
Patient dropped off paperwork  for patient assistance. I will put it in the pink folder for you.

## 2023-02-24 ENCOUNTER — Other Ambulatory Visit (HOSPITAL_COMMUNITY): Payer: Self-pay

## 2023-02-24 ENCOUNTER — Telehealth: Payer: Self-pay

## 2023-02-24 DIAGNOSIS — H34812 Central retinal vein occlusion, left eye, with macular edema: Secondary | ICD-10-CM | POA: Diagnosis not present

## 2023-02-24 DIAGNOSIS — H04122 Dry eye syndrome of left lacrimal gland: Secondary | ICD-10-CM | POA: Diagnosis not present

## 2023-02-24 DIAGNOSIS — H35371 Puckering of macula, right eye: Secondary | ICD-10-CM | POA: Diagnosis not present

## 2023-02-24 DIAGNOSIS — H43813 Vitreous degeneration, bilateral: Secondary | ICD-10-CM | POA: Diagnosis not present

## 2023-02-24 NOTE — Telephone Encounter (Addendum)
Application received. Still need provider signature and date. Please have provider sign the prescriber form on patient's chart media (AMB Correspondence from 02/24/23) and fax back to [phone]

## 2023-02-24 NOTE — Telephone Encounter (Signed)
Pt's Jason [name] patient assistance application was scanned to [employer], [facility] email. FYI

## 2023-02-25 NOTE — Telephone Encounter (Signed)
Form printed and signed by Dr. Lynnette Caffey, faxed to number provided.

## 2023-02-26 NOTE — Telephone Encounter (Signed)
Received, thank you Michalene!

## 2023-03-31 ENCOUNTER — Telehealth: Payer: Self-pay | Admitting: Internal Medicine

## 2023-03-31 ENCOUNTER — Other Ambulatory Visit (HOSPITAL_COMMUNITY): Payer: Self-pay

## 2023-03-31 ENCOUNTER — Other Ambulatory Visit: Payer: Self-pay | Admitting: Internal Medicine

## 2023-03-31 NOTE — Telephone Encounter (Signed)
Patient has a current BMS approval that expires 04/14/23. Renewal application for Eliquis has been submitted to [employer]: [employer], via fax.  If patient needs an update, please refer them to BMS at [phone].

## 2023-03-31 NOTE — Telephone Encounter (Addendum)
Patient has a current BMS approval that expires 04/14/23. PAP: Application for Eliquis has been submitted to P[employer] Companies: [employer], via fax.  If patient needs an update, please refer them to BMS at [phone].

## 2023-03-31 NOTE — Telephone Encounter (Signed)
Daughter is calling for update for bristal myers. Please advise

## 2023-04-24 NOTE — Telephone Encounter (Signed)
 PAP: Patient assistance application Eliquis  for has been approved by [employer] Companies: [employer] from 04/23/23 to 04/13/24. Medication should be delivered to PAP Delivery: Home For further shipping updates, please contact [employer] (BMS) at [phone] Pt ID is: [id number]

## 2023-04-27 ENCOUNTER — Other Ambulatory Visit: Payer: Self-pay

## 2023-04-27 MED ORDER — APIXABAN 5 MG PO TABS: 5.0000 mg | ORAL_TABLET | Freq: Two times a day (BID) | ORAL | 1 refills | Status: DC

## 2023-04-27 NOTE — Telephone Encounter (Signed)
 Prescription refill request for Eliquis received. Indication:afib Last office visit:8/24 Scr:1.11  7/24 Age: 83 Weight:81.6  kg  Prescription refilled

## 2023-04-30 DIAGNOSIS — I4891 Unspecified atrial fibrillation: Secondary | ICD-10-CM | POA: Diagnosis not present

## 2023-04-30 DIAGNOSIS — Z79899 Other long term (current) drug therapy: Secondary | ICD-10-CM | POA: Diagnosis not present

## 2023-04-30 DIAGNOSIS — E1169 Type 2 diabetes mellitus with other specified complication: Secondary | ICD-10-CM | POA: Diagnosis not present

## 2023-04-30 DIAGNOSIS — E785 Hyperlipidemia, unspecified: Secondary | ICD-10-CM | POA: Diagnosis not present

## 2023-04-30 DIAGNOSIS — I509 Heart failure, unspecified: Secondary | ICD-10-CM | POA: Diagnosis not present

## 2023-04-30 DIAGNOSIS — Z6824 Body mass index (BMI) 24.0-24.9, adult: Secondary | ICD-10-CM | POA: Diagnosis not present

## 2023-05-04 ENCOUNTER — Other Ambulatory Visit: Payer: Self-pay | Admitting: Internal Medicine

## 2023-05-25 ENCOUNTER — Ambulatory Visit: Payer: Medicare Other | Admitting: Physician Assistant

## 2023-05-25 DIAGNOSIS — Z8719 Personal history of other diseases of the digestive system: Secondary | ICD-10-CM | POA: Diagnosis not present

## 2023-05-25 DIAGNOSIS — N390 Urinary tract infection, site not specified: Secondary | ICD-10-CM | POA: Diagnosis not present

## 2023-05-25 DIAGNOSIS — I130 Hypertensive heart and chronic kidney disease with heart failure and stage 1 through stage 4 chronic kidney disease, or unspecified chronic kidney disease: Secondary | ICD-10-CM | POA: Diagnosis not present

## 2023-05-25 DIAGNOSIS — I4891 Unspecified atrial fibrillation: Secondary | ICD-10-CM | POA: Diagnosis not present

## 2023-05-25 DIAGNOSIS — N179 Acute kidney failure, unspecified: Secondary | ICD-10-CM | POA: Diagnosis not present

## 2023-05-25 DIAGNOSIS — E1122 Type 2 diabetes mellitus with diabetic chronic kidney disease: Secondary | ICD-10-CM | POA: Diagnosis not present

## 2023-05-25 DIAGNOSIS — Z1152 Encounter for screening for COVID-19: Secondary | ICD-10-CM | POA: Diagnosis not present

## 2023-05-25 DIAGNOSIS — Z87442 Personal history of urinary calculi: Secondary | ICD-10-CM | POA: Diagnosis not present

## 2023-05-25 DIAGNOSIS — I6203 Nontraumatic chronic subdural hemorrhage: Secondary | ICD-10-CM | POA: Diagnosis not present

## 2023-05-25 DIAGNOSIS — I509 Heart failure, unspecified: Secondary | ICD-10-CM | POA: Diagnosis present

## 2023-05-25 DIAGNOSIS — R531 Weakness: Secondary | ICD-10-CM | POA: Diagnosis not present

## 2023-05-25 DIAGNOSIS — J019 Acute sinusitis, unspecified: Secondary | ICD-10-CM | POA: Diagnosis not present

## 2023-05-25 DIAGNOSIS — Z79899 Other long term (current) drug therapy: Secondary | ICD-10-CM | POA: Diagnosis not present

## 2023-05-25 DIAGNOSIS — Z7984 Long term (current) use of oral hypoglycemic drugs: Secondary | ICD-10-CM | POA: Diagnosis not present

## 2023-05-25 DIAGNOSIS — Z7901 Long term (current) use of anticoagulants: Secondary | ICD-10-CM | POA: Diagnosis not present

## 2023-05-25 DIAGNOSIS — N189 Chronic kidney disease, unspecified: Secondary | ICD-10-CM | POA: Diagnosis not present

## 2023-05-27 ENCOUNTER — Telehealth: Payer: Self-pay | Admitting: Internal Medicine

## 2023-05-27 ENCOUNTER — Encounter: Payer: Self-pay | Admitting: Internal Medicine

## 2023-05-27 NOTE — Telephone Encounter (Signed)
Pt daughter calling in asking to speak with nurse about pt medications being changed during admission at [facility]. Please advise.

## 2023-05-27 NOTE — Telephone Encounter (Signed)
Spoke with patient and he gave verbal permission to speak with daughter Doristine Johns.  She states he was in the hospital and they stated he was in AFIB and they wanted him to increase his metoprolol to 25 mg instead of 0.5 tab. She just wanted to see what cardiology think because he had trouble with taking 25 mg of metoprolol with his BP dropping to low

## 2023-05-28 NOTE — Telephone Encounter (Signed)
In past he took carvedilol, bp dropped and he was run down.  Took off bp meds and stopped carvedilol and started metoprolol.  She doesn't remember the dose.   She is aware of Dr. Trula Ore recommendation and will keep log of readings and bring it to the appointment with [facility]

## 2023-06-01 ENCOUNTER — Telehealth: Payer: Self-pay | Admitting: Internal Medicine

## 2023-06-01 DIAGNOSIS — I4891 Unspecified atrial fibrillation: Secondary | ICD-10-CM | POA: Diagnosis not present

## 2023-06-01 DIAGNOSIS — E1169 Type 2 diabetes mellitus with other specified complication: Secondary | ICD-10-CM | POA: Diagnosis not present

## 2023-06-01 DIAGNOSIS — E1122 Type 2 diabetes mellitus with diabetic chronic kidney disease: Secondary | ICD-10-CM | POA: Diagnosis not present

## 2023-06-01 DIAGNOSIS — N182 Chronic kidney disease, stage 2 (mild): Secondary | ICD-10-CM | POA: Diagnosis not present

## 2023-06-01 DIAGNOSIS — Z79899 Other long term (current) drug therapy: Secondary | ICD-10-CM | POA: Diagnosis not present

## 2023-06-01 DIAGNOSIS — Z6824 Body mass index (BMI) 24.0-24.9, adult: Secondary | ICD-10-CM | POA: Diagnosis not present

## 2023-06-01 DIAGNOSIS — E785 Hyperlipidemia, unspecified: Secondary | ICD-10-CM | POA: Diagnosis not present

## 2023-06-01 DIAGNOSIS — J019 Acute sinusitis, unspecified: Secondary | ICD-10-CM | POA: Diagnosis not present

## 2023-06-01 NOTE — Telephone Encounter (Signed)
Patient has dropped off Financial assistance paperwork - I am placing these documents in Dr. Trula Ore box.  Thank you.

## 2023-06-04 NOTE — Telephone Encounter (Signed)
Signed paperwork faxed to patient assistance team.

## 2023-06-05 ENCOUNTER — Telehealth: Payer: Self-pay

## 2023-06-05 NOTE — Telephone Encounter (Addendum)
 PAP: Application for [name] has been submitted to [employer] ([employer]), via fax

## 2023-06-09 NOTE — Progress Notes (Unsigned)
 Cardiology Office Note:  .   Date:  06/10/2023  ID:  Jason [name], DOB 11/30/1940, MRN [id number] PCP: Otila Back  [facility] Providers Cardiologist:  Orbie Pyo, MD {    History of Present Illness: .   Jason [name] is a 83 y.o. male with a past medical history of chronic diastolic heart failure, persistent atrial fibrillation, type 2 diabetes mellitus, hypertension, hyperlipidemia, CAD, CKD, and ascending aortic aneurysm here for follow-up appointment.  Patient was last seen about 6 months ago and at that time his digoxin was discontinued and his carvedilol was titrated to 25 twice daily.  Blood pressure was well-controlled at that appointment.  CTA was ordered to evaluate his thoracic aortic aneurysm.  Last time it was evaluated at 4.1 cm.  If it remains stable suggestion was to extend imaging to every 2 years.  He was overall doing well at his appointment with no cardiovascular complaints.  Did develop some mild new onset bruising with Eliquis but no significant bleeding.  No emergency room visits or hospitalizations.  No presyncope or syncope.  Denied any severe palpitations.  No PND orthopnea.  Able to do most of his activities of living without any limitations.  Today, he presents, with a history of aortic enlargement and atrial fibrillation,  for a follow-up visit. The patient's aortic enlargement has been stable at 4.1 cm for the past couple of years and has not required surgical intervention. The patient's blood pressure has been well controlled, with a recent reading of 110/70.  The patient was recently hospitalized due to a severe sinus infection, which caused a significant increase in heart rate, up to 180-190 bpm. He was found to be in Afib with RVR.  Since then, the patient has been monitoring their blood pressure and heart rate at home. The heart rate has been much better, in the 60s and 70s. Daughter purchased a Lourena Simmonds for more accurate monitoring.   The  patient is currently on metoprolol, which was recently increased to 25 mg daily upon hospital discharge. However, the patient reports feeling run down and fatigued on this dose, so the dose was reduced back to the original 12.5 mg daily. The patient's heart rate was still high (150 bpm) on the morning of the visit, but it has been coming down.  The patient also reports feeling better since getting out of the hospital and is getting their appetite back. The patient is compliant with their medications and uses a pill box to ensure they take their medications as prescribed.  Reports no shortness of breath nor dyspnea on exertion. Reports no chest pain, pressure, or tightness. No edema, orthopnea, PND. Reports no palpitations.   Discussed the use of AI scribe software for clinical note transcription with the patient, who gave verbal consent to proceed.  ROS: Pertinent ROS in HPI  Studies Reviewed: Marland Kitchen   EKG Interpretation Date/Time:  Wednesday June 10 2023 10:54:11 EST Ventricular Rate:  110 PR Interval:    QRS Duration:  106 QT Interval:  328 QTC Calculation: 443 R Axis:   -32  Text Interpretation: Atrial fibrillation with rapid ventricular response Left axis deviation Incomplete right bundle branch block When compared with ECG of 30-Jul-2016 14:22, Atrial fibrillation has replaced Sinus rhythm Confirmed by Jari Favre [phone]) on 06/10/2023 11:32:37 AM    CARDIAC CATHETERIZATION   CARDIAC CATHETERIZATION 11/16/2015   Narrative  Prox LAD lesion, 65 %stenosed.  Mid RCA lesion, 30 %stenosed.  1st Mrg lesion, 75 %  stenosed.  The left ventricular systolic function is normal.  LV end diastolic pressure is mildly elevated.  The left ventricular ejection fraction is 55-65% by visual estimate.    Noncritical coronary atherosclerosis with 60-70% calcified eccentric mid LAD stenosis. 75% stenosis in the midportion of a small first obtuse marginal, and 30% mid RCA.  Normal left ventricular  function. Elevated left ventricular end-diastolic pressure compatible with diastolic dysfunction. EF 55-60%.  Apical ischemia felt secondary to borderline significant mid LAD. In absence of symptoms intensification of medical therapy seems most prudent approach.   RECOMMENDATIONS:  With improved LV function, further intensification of beta blocker therapy would be helpful for ischemic heart disease noted above. ACE inhibitor therapy is not mandatory.  Antiplatelet therapy.  Goal will be eventual discontinuation of amiodarone if no recurrences of atrial fib.  Long-term anticoagulation therapy with Eliquis may probably be necessary but will depend upon course and whether there is recurrent atrial fibrillation.  Aggressive risk factor modification.   Findings Coronary Findings Diagnostic  Dominance: Co-dominant   Left Anterior Descending Culprit lesion. The lesion is type C, segmental and eccentric. The lesion is moderately calcified.   Left Circumflex   First Obtuse Marginal Branch Vessel is small in size. The lesion is type C and eccentric.   Right Coronary Artery The lesion is type A.   Intervention   No interventions have been documented.   STRESS TESTS   MYOCARDIAL PERFUSION IMAGING 10/01/2015   Narrative  Nuclear stress EF: 57%.  There was no ST segment deviation noted during stress.  No T wave inversion was noted during stress.  Defect 1: There is a small defect of moderate severity present in the mid anteroseptal and apical septal location. Mostly infarct with very mild peri-infarct ischemia.  Defect 2: There is a small defect of moderate severity present in the apical lateral location. This is consistent with infarct.  This is a low risk study.  The left ventricular ejection fraction is normal (55-65%).   ECHOCARDIOGRAM   ECHOCARDIOGRAM COMPLETE 09/19/2020   Narrative ECHOCARDIOGRAM REPORT       Patient Name:   Jason [name] Date of Exam:  09/19/2020 Medical Rec #:  [id number]     Height:       71.0 in Accession #:    [id number]    Weight:       193.0 lb Date of Birth:  [birth year]    BSA:          2.077 m Patient Age:    79 years      BP:           116/80 mmHg Patient Gender: M             HR:           107 bpm. Exam Location:  [address]   Procedure: 2D Echo, Cardiac Doppler and Color Doppler   Indications:    I50.9 CHF   History:        Patient has prior history of Echocardiogram examinations, most recent 04/27/2018. CHF, Arrythmias:Atrial Fibrillation; Risk Factors:Hypertension, Family History of Coronary Artery Disease and Diabetes. Prior EF (55-60%), History of Cardioversion.   Sonographer:    Farrel Conners RDCS Referring Phys: [phone] Barry Dienes [facility]   IMPRESSIONS     1. Left ventricular ejection fraction, by estimation, is 50 to 55%. The left ventricle has low normal function. The left ventricle demonstrates global hypokinesis. There is mild concentric left ventricular hypertrophy. Left ventricular diastolic function  could not be evaluated. 2. Right ventricular systolic function is normal. The right ventricular size is normal. There is mildly elevated pulmonary artery systolic pressure. The estimated right ventricular systolic pressure is 40.0 mmHg. 3. Left atrial size was severely dilated. 4. Right atrial size was severely dilated. 5. The mitral valve is normal in structure. Mild mitral valve regurgitation. 6. The aortic valve is tricuspid. Aortic valve regurgitation is not visualized. Mild aortic valve sclerosis is present, with no evidence of aortic valve stenosis. 7. Aortic dilatation noted. There is moderate dilatation of the ascending aorta, measuring 46 mm. 8. The inferior vena cava is dilated in size with <50% respiratory variability, suggesting right atrial pressure of 15 mmHg.   Comparison(s): Prior images reviewed side by side. The left ventricular function is worsened. On the current study the rhyth is  atrial fibrillation with ventricular rates 110-130 bpm.   FINDINGS Left Ventricle: Left ventricular ejection fraction, by estimation, is 50 to 55%. The left ventricle has low normal function. The left ventricle demonstrates global hypokinesis. The left ventricular internal cavity size was normal in size. There is mild concentric left ventricular hypertrophy. Abnormal (paradoxical) septal motion consistent with post-operative status. Left ventricular diastolic function could not be evaluated due to atrial fibrillation. Left ventricular diastolic function could not be evaluated.   Right Ventricle: The right ventricular size is normal. No increase in right ventricular wall thickness. Right ventricular systolic function is normal. There is mildly elevated pulmonary artery systolic pressure. The tricuspid regurgitant velocity is 2.50 m/s, and with an assumed right atrial pressure of 15 mmHg, the estimated right ventricular systolic pressure is 40.0 mmHg.   Left Atrium: Left atrial size was severely dilated.   Right Atrium: Right atrial size was severely dilated.   Pericardium: There is no evidence of pericardial effusion.   Mitral Valve: The mitral valve is normal in structure. Mild mitral valve regurgitation, with centrally-directed jet.   Tricuspid Valve: The tricuspid valve is normal in structure. Tricuspid valve regurgitation is trivial.   Aortic Valve: The aortic valve is tricuspid. Aortic valve regurgitation is not visualized. Aortic regurgitation PHT measures 207 msec. Mild aortic valve sclerosis is present, with no evidence of aortic valve stenosis.   Pulmonic Valve: The pulmonic valve was not well visualized. Pulmonic valve regurgitation is not visualized.   Aorta: Aortic dilatation noted. There is moderate dilatation of the ascending aorta, measuring 46 mm.   Venous: The inferior vena cava is dilated in size with less than 50% respiratory variability, suggesting right atrial pressure  of 15 mmHg.   IAS/Shunts: No atrial level shunt detected by color flow Doppler.         Physical Exam:   VS:  BP 110/70   Pulse (!) 110   Ht 5\' 11"  (1.803 m)   Wt 168 lb 12.8 oz (76.6 kg)   SpO2 99%   BMI 23.54 kg/m    Wt Readings from Last 3 Encounters:  06/10/23 168 lb 12.8 oz (76.6 kg)  11/26/22 180 lb (81.6 kg)  04/09/22 175 lb 6.4 oz (79.6 kg)    GEN: Well nourished, well developed in no acute distress NECK: No JVD; No carotid bruits CARDIAC: RRR, no murmurs, rubs, gallops RESPIRATORY:  Clear to auscultation without rales, wheezing or rhonchi  ABDOMEN: Soft, non-tender, non-distended EXTREMITIES:  No edema; No deformity   ASSESSMENT AND PLAN: .   Aortic Aneurysm: Stable at 4.1 cm for the past couple of years. No surgical intervention required at this time. -Continue yearly  evaluations.  Hypertension: Well controlled with current regimen. -Continue current antihypertensive medications.  Persistent Atrial Fibrillation: Recent hospitalization due to sinus infection and elevated heart rate. Currently managed with Metoprolol 12.5mg  twice daily. Patient reports feeling better since hospital discharge. -Continue Metoprolol 12.5mg  twice daily. -Monitor heart rate and blood pressure at home.  Medication Management: Patient is on Eliquis and [place], which are obtained through patient assistance programs due to high cost. -Resend prescription for [name] to ensure continuity of medication. Samples provided todat -Continue Eliquis for stroke prevention.  General Health Maintenance: Patient reports feeling better since hospital discharge and is engaging in more physical activity. -Encourage continued adherence to medication regimen and physical activity.  Chronic diastolic heart failure -euvolemic on exam today, continue current medications.  Hyperlipidemia -LDL 38 -continue current medications     Dispo: He can follow-up in 6 months with Dr. Lynnette Caffey  Signed, Sharlene Dory, PA-C

## 2023-06-10 ENCOUNTER — Encounter: Payer: Self-pay | Admitting: Physician Assistant

## 2023-06-10 ENCOUNTER — Ambulatory Visit: Payer: Medicare Other | Admitting: Physician Assistant

## 2023-06-10 VITALS — BP 110/70 | HR 110 | Ht 71.0 in | Wt 168.8 lb

## 2023-06-10 DIAGNOSIS — I5032 Chronic diastolic (congestive) heart failure: Secondary | ICD-10-CM | POA: Diagnosis not present

## 2023-06-10 DIAGNOSIS — I7121 Aneurysm of the ascending aorta, without rupture: Secondary | ICD-10-CM

## 2023-06-10 DIAGNOSIS — N182 Chronic kidney disease, stage 2 (mild): Secondary | ICD-10-CM

## 2023-06-10 DIAGNOSIS — E785 Hyperlipidemia, unspecified: Secondary | ICD-10-CM

## 2023-06-10 DIAGNOSIS — Z7901 Long term (current) use of anticoagulants: Secondary | ICD-10-CM | POA: Diagnosis not present

## 2023-06-10 DIAGNOSIS — I10 Essential (primary) hypertension: Secondary | ICD-10-CM

## 2023-06-10 DIAGNOSIS — Z79899 Other long term (current) drug therapy: Secondary | ICD-10-CM | POA: Diagnosis not present

## 2023-06-10 DIAGNOSIS — I2510 Atherosclerotic heart disease of native coronary artery without angina pectoris: Secondary | ICD-10-CM | POA: Diagnosis not present

## 2023-06-10 DIAGNOSIS — I4819 Other persistent atrial fibrillation: Secondary | ICD-10-CM

## 2023-06-10 DIAGNOSIS — E118 Type 2 diabetes mellitus with unspecified complications: Secondary | ICD-10-CM

## 2023-06-10 DIAGNOSIS — E1169 Type 2 diabetes mellitus with other specified complication: Secondary | ICD-10-CM | POA: Diagnosis not present

## 2023-06-10 LAB — CBC
Hematocrit: 50.7 % (ref 37.5–51.0)
Hemoglobin: 17 g/dL (ref 13.0–17.7)
MCH: 30.2 pg (ref 26.6–33.0)
MCHC: 33.5 g/dL (ref 31.5–35.7)
MCV: 90 fL (ref 79–97)
Platelets: 204 10*3/uL (ref 150–450)
RBC: 5.62 x10E6/uL (ref 4.14–5.80)
RDW: 13.5 % (ref 11.6–15.4)
WBC: 9.5 10*3/uL (ref 3.4–10.8)

## 2023-06-10 MED ORDER — DAPAGLIFLOZIN PROPANEDIOL 10 MG PO TABS: 10.0000 mg | ORAL_TABLET | Freq: Every day | ORAL | 3 refills | Status: DC

## 2023-06-10 MED ORDER — FUROSEMIDE 20 MG PO TABS: 20.0000 mg | ORAL_TABLET | Freq: Every day | ORAL | 3 refills | Status: DC

## 2023-06-10 MED ORDER — DAPAGLIFLOZIN PROPANEDIOL 10 MG PO TABS: 10.0000 mg | ORAL_TABLET | Freq: Every day | ORAL | 0 refills | Status: DC

## 2023-06-10 MED ORDER — METOPROLOL SUCCINATE ER 25 MG PO TB24: 12.5000 mg | ORAL_TABLET | Freq: Every day | ORAL | 3 refills | Status: DC

## 2023-06-10 MED ORDER — APIXABAN 5 MG PO TABS: 5.0000 mg | ORAL_TABLET | Freq: Two times a day (BID) | ORAL | 3 refills | Status: DC

## 2023-06-10 NOTE — Patient Instructions (Signed)
 Medication Instructions:  Start Farxiga 10 mg once daily *If you need a refill on your cardiac medications before your next appointment, please call your pharmacy*   Lab Work: CBC If you have labs (blood work) drawn today and your tests are completely normal, you will receive your results only by: MyChart Message (if you have MyChart) OR A paper copy in the mail If you have any lab test that is abnormal or we need to change your treatment, we will call you to review the results.  Follow-Up: At [facility], you and your health needs are our priority.  As part of our continuing mission to provide you with exceptional heart care, we have created designated Provider Care Teams.  These Care Teams include your primary Cardiologist (physician) and Advanced Practice Providers (APPs -  Physician Assistants and Nurse Practitioners) who all work together to provide you with the care you need, when you need it.  We recommend signing up for the patient portal called "MyChart".  Sign up information is provided on this After Visit Summary.  MyChart is used to connect with patients for Virtual Visits (Telemedicine).  Patients are able to view lab/test results, encounter notes, upcoming appointments, etc.  Non-urgent messages can be sent to your provider as well.   To learn more about what you can do with MyChart, go to [email or web address].    Your next appointment:   6 month(s)  Provider:   Lynnette Caffey, MD  Other Instructions   1st Floor: - Lobby - Registration  - Pharmacy  - Lab - Cafe  2nd Floor: - PV Lab - Diagnostic Testing (echo, CT, nuclear med)  3rd Floor: - Vacant  4th Floor: - TCTS (cardiothoracic surgery) - AFib Clinic - Structural Heart Clinic - Vascular Surgery  - Vascular Ultrasound  5th Floor: - HeartCare Cardiology (general and EP) - Clinical Pharmacy for coumadin, hypertension, lipid, weight-loss medications, and med management appointments    Valet  parking services will be available as well.

## 2023-06-12 ENCOUNTER — Encounter: Payer: Self-pay | Admitting: Physician Assistant

## 2023-06-16 DIAGNOSIS — H43813 Vitreous degeneration, bilateral: Secondary | ICD-10-CM | POA: Diagnosis not present

## 2023-06-16 DIAGNOSIS — H34812 Central retinal vein occlusion, left eye, with macular edema: Secondary | ICD-10-CM | POA: Diagnosis not present

## 2023-06-16 DIAGNOSIS — H3562 Retinal hemorrhage, left eye: Secondary | ICD-10-CM | POA: Diagnosis not present

## 2023-06-16 DIAGNOSIS — H35371 Puckering of macula, right eye: Secondary | ICD-10-CM | POA: Diagnosis not present

## 2023-06-16 DIAGNOSIS — H04122 Dry eye syndrome of left lacrimal gland: Secondary | ICD-10-CM | POA: Diagnosis not present

## 2023-06-23 ENCOUNTER — Other Ambulatory Visit: Payer: Self-pay

## 2023-06-23 MED ORDER — DAPAGLIFLOZIN PROPANEDIOL 10 MG PO TABS: 10.0000 mg | ORAL_TABLET | Freq: Every day | ORAL | 3 refills | Status: DC

## 2023-06-23 NOTE — Telephone Encounter (Signed)
 Per note from pharmacy tech:   Fax received from [employer] company requesting an RX (refer to chart media). Please send in prescription for Farxiga to [facility]       Rx for Farxiga sent to [facility].

## 2023-06-23 NOTE — Telephone Encounter (Signed)
 Fax received from [employer] company requesting an RX (refer to chart media). Please send in prescription for Farxiga to [facility]

## 2023-08-19 ENCOUNTER — Encounter: Payer: Self-pay | Admitting: Internal Medicine

## 2023-10-06 DIAGNOSIS — H34812 Central retinal vein occlusion, left eye, with macular edema: Secondary | ICD-10-CM | POA: Diagnosis not present

## 2023-10-06 DIAGNOSIS — H35371 Puckering of macula, right eye: Secondary | ICD-10-CM | POA: Diagnosis not present

## 2023-10-06 DIAGNOSIS — H43813 Vitreous degeneration, bilateral: Secondary | ICD-10-CM | POA: Diagnosis not present

## 2023-10-06 DIAGNOSIS — H04122 Dry eye syndrome of left lacrimal gland: Secondary | ICD-10-CM | POA: Diagnosis not present

## 2023-10-06 DIAGNOSIS — H3562 Retinal hemorrhage, left eye: Secondary | ICD-10-CM | POA: Diagnosis not present

## 2023-10-19 ENCOUNTER — Other Ambulatory Visit: Payer: Self-pay | Admitting: *Deleted

## 2023-10-19 DIAGNOSIS — I480 Paroxysmal atrial fibrillation: Secondary | ICD-10-CM

## 2023-10-19 MED ORDER — APIXABAN 5 MG PO TABS: 5.0000 mg | ORAL_TABLET | Freq: Two times a day (BID) | ORAL | 1 refills | Status: DC

## 2023-10-19 NOTE — Telephone Encounter (Signed)
 Eliquis  5mg  refill request received. Patient is 83 years old, weight-76.6kg, Crea-13.32 on 05/01/23 via [employer] tab from [employer], Diagnosis-Afib, and last seen by Orren Fabry on 06/10/23. Dose is appropriate based on dosing criteria. Will send in refill to requested pharmacy.

## 2023-10-29 DIAGNOSIS — D692 Other nonthrombocytopenic purpura: Secondary | ICD-10-CM | POA: Diagnosis not present

## 2023-10-29 DIAGNOSIS — E785 Hyperlipidemia, unspecified: Secondary | ICD-10-CM | POA: Diagnosis not present

## 2023-10-29 DIAGNOSIS — N182 Chronic kidney disease, stage 2 (mild): Secondary | ICD-10-CM | POA: Diagnosis not present

## 2023-10-29 DIAGNOSIS — I509 Heart failure, unspecified: Secondary | ICD-10-CM | POA: Diagnosis not present

## 2023-10-29 DIAGNOSIS — I4891 Unspecified atrial fibrillation: Secondary | ICD-10-CM | POA: Diagnosis not present

## 2023-10-29 DIAGNOSIS — E1122 Type 2 diabetes mellitus with diabetic chronic kidney disease: Secondary | ICD-10-CM | POA: Diagnosis not present

## 2023-10-29 DIAGNOSIS — E1169 Type 2 diabetes mellitus with other specified complication: Secondary | ICD-10-CM | POA: Diagnosis not present

## 2023-10-29 DIAGNOSIS — Z6824 Body mass index (BMI) 24.0-24.9, adult: Secondary | ICD-10-CM | POA: Diagnosis not present

## 2023-11-28 ENCOUNTER — Other Ambulatory Visit: Payer: Self-pay | Admitting: Internal Medicine

## 2024-01-12 NOTE — Progress Notes (Signed)
 Cardiology Office Note    Patient Name: Jason [name] Date of Encounter: 01/12/2024  Primary Care Provider:  Venancio Pock, PA-C Primary Cardiologist:  Lurena MARLA Red, MD Primary Electrophysiologist: None   Past Medical History    Past Medical History:  Diagnosis Date   Atrial fibrillation (HCC)    Diabetes mellitus without complication (HCC)    Hypertension     History of Present Illness  Jason [name] is a 83 y.o. male with PMH of chronic combined systolic diastolic CHF, atrial fibrillation, HTN, DM type II, aortic dilation (41 mm), HLD who presents today for 59-month follow-up.   Jason [name] presents today for 35-month follow-up.  He was last seen on 06/10/2023 by Orren Fabry, PA and during visit patient's blood pressure was well-controlled.  He had been recently hospitalized for severe sinus infection that caused AF with RVR and his metoprolol  was increased to 25 mg at discharge.  He unfortunately reported feeling fatigued and rundown and metoprolol  dose was decreased to 12.5 mg.  He endorses no chest pain or shortness of breath during his visit and no med changes were made at that time.   Patient denies chest pain, palpitations, dyspnea, PND, orthopnea, nausea, vomiting, dizziness, syncope, edema, weight gain, or early satiety.   Discussed the use of AI scribe software for clinical note transcription with the patient, who gave verbal consent to proceed.  History of Present Illness    ***Notes:   Review of Systems  Please see the history of present illness.    All other systems reviewed and are otherwise negative except as noted above.  Physical Exam    Wt Readings from Last 3 Encounters:  06/10/23 168 lb 12.8 oz (76.6 kg)  11/26/22 180 lb (81.6 kg)  04/09/22 175 lb 6.4 oz (79.6 kg)   CD:Uyzmz were no vitals filed for this visit.,There is no height or weight on file to calculate BMI. GEN: Well nourished, well developed in no acute distress Neck: No JVD; No carotid  bruits Pulmonary: Clear to auscultation without rales, wheezing or rhonchi  Cardiovascular: Normal rate. Regular rhythm. Normal S1. Normal S2.   Murmurs: There is no murmur.  ABDOMEN: Soft, non-tender, non-distended EXTREMITIES:  No edema; No deformity   EKG/LABS/ Recent Cardiac Studies   ECG personally reviewed by me today - ***  Risk Assessment/Calculations:   {Does this patient have ATRIAL FIBRILLATION?[phone]}      Lab Results  Component Value Date   WBC 9.5 06/10/2023   HGB 17.0 06/10/2023   HCT 50.7 06/10/2023   MCV 90 06/10/2023   PLT 204 06/10/2023   Lab Results  Component Value Date   CREATININE 1.21 10/11/2021   BUN 25 10/11/2021   NA 140 10/11/2021   K 4.2 10/11/2021   CL 99 10/11/2021   CO2 26 10/11/2021   Lab Results  Component Value Date   CHOL 82 (L) 09/19/2020   HDL 29 (L) 09/19/2020   LDLCALC 38 09/19/2020   TRIG 65 09/19/2020   CHOLHDL 2.8 09/19/2020    Lab Results  Component Value Date   HGBA1C 7.2 (H) 07/29/2015   Assessment & Plan    Assessment and Plan Assessment & Plan     1.  Paroxysmal AF  2.  HFpEF  3.  Essential hypertension  4.  Hyperlipidemia  5.  DM type II      Disposition: Follow-up with Arun K Thukkani, MD or APP in *** months {Are you ordering a CV Procedure (e.g. stress  test, cath, DCCV, TEE, etc)?   Press F2        :[id number]}   Signed, Wyn Raddle, Jackee Shove, NP 01/12/2024, 8:28 AM [facility]

## 2024-01-13 ENCOUNTER — Encounter: Payer: Self-pay | Admitting: Nurse Practitioner

## 2024-01-13 ENCOUNTER — Ambulatory Visit: Attending: Internal Medicine | Admitting: Nurse Practitioner

## 2024-01-13 VITALS — BP 110/68 | HR 100 | Ht 71.0 in | Wt 181.8 lb

## 2024-01-13 DIAGNOSIS — I480 Paroxysmal atrial fibrillation: Secondary | ICD-10-CM | POA: Insufficient documentation

## 2024-01-13 DIAGNOSIS — E1169 Type 2 diabetes mellitus with other specified complication: Secondary | ICD-10-CM | POA: Insufficient documentation

## 2024-01-13 DIAGNOSIS — E785 Hyperlipidemia, unspecified: Secondary | ICD-10-CM | POA: Diagnosis not present

## 2024-01-13 DIAGNOSIS — E118 Type 2 diabetes mellitus with unspecified complications: Secondary | ICD-10-CM | POA: Diagnosis not present

## 2024-01-13 DIAGNOSIS — I10 Essential (primary) hypertension: Secondary | ICD-10-CM | POA: Insufficient documentation

## 2024-01-13 DIAGNOSIS — I5032 Chronic diastolic (congestive) heart failure: Secondary | ICD-10-CM | POA: Diagnosis not present

## 2024-01-13 MED ORDER — METOPROLOL SUCCINATE ER 25 MG PO TB24: 12.5000 mg | ORAL_TABLET | Freq: Every day | ORAL | 3 refills | Status: AC

## 2024-01-13 NOTE — Patient Instructions (Signed)
 Medication Instructions:  Your physician recommends that you continue on your current medications as directed. Please refer to the Current Medication list given to you today. *If you need a refill on your cardiac medications before your next appointment, please call your pharmacy*  Lab Work: NONE ORDERED If you have labs (blood work) drawn today and your tests are completely normal, you will receive your results only by: MyChart Message (if you have MyChart) OR A paper copy in the mail If you have any lab test that is abnormal or we need to change your treatment, we will call you to review the results.  Testing/Procedures: NONE ORDERED  Follow-Up: At [facility], you and your health needs are our priority.  As part of our continuing mission to provide you with exceptional heart care, our providers are all part of one team.  This team includes your primary Cardiologist (physician) and Advanced Practice Providers or APPs (Physician Assistants and Nurse Practitioners) who all work together to provide you with the care you need, when you need it.  Your next appointment:   6 month(s)  Provider:   Orren Fabry, PA-C        We recommend signing up for the patient portal called MyChart.  Sign up information is provided on this After Visit Summary.  MyChart is used to connect with patients for Virtual Visits (Telemedicine).  Patients are able to view lab/test results, encounter notes, upcoming appointments, etc.  Non-urgent messages can be sent to your provider as well.   To learn more about what you can do with MyChart, go to [email or web address].   Other Instructions

## 2024-01-22 DIAGNOSIS — Z23 Encounter for immunization: Secondary | ICD-10-CM | POA: Diagnosis not present

## 2024-01-26 DIAGNOSIS — H34812 Central retinal vein occlusion, left eye, with macular edema: Secondary | ICD-10-CM | POA: Diagnosis not present

## 2024-01-26 DIAGNOSIS — H401122 Primary open-angle glaucoma, left eye, moderate stage: Secondary | ICD-10-CM | POA: Diagnosis not present

## 2024-01-26 DIAGNOSIS — H3562 Retinal hemorrhage, left eye: Secondary | ICD-10-CM | POA: Diagnosis not present

## 2024-01-26 DIAGNOSIS — H35371 Puckering of macula, right eye: Secondary | ICD-10-CM | POA: Diagnosis not present

## 2024-01-26 DIAGNOSIS — H04122 Dry eye syndrome of left lacrimal gland: Secondary | ICD-10-CM | POA: Diagnosis not present

## 2024-01-26 DIAGNOSIS — H43813 Vitreous degeneration, bilateral: Secondary | ICD-10-CM | POA: Diagnosis not present

## 2024-02-24 DIAGNOSIS — Z6825 Body mass index (BMI) 25.0-25.9, adult: Secondary | ICD-10-CM | POA: Diagnosis not present

## 2024-02-24 DIAGNOSIS — M1712 Unilateral primary osteoarthritis, left knee: Secondary | ICD-10-CM | POA: Diagnosis not present

## 2024-02-24 DIAGNOSIS — I509 Heart failure, unspecified: Secondary | ICD-10-CM | POA: Diagnosis not present

## 2024-02-24 DIAGNOSIS — M25562 Pain in left knee: Secondary | ICD-10-CM | POA: Diagnosis not present

## 2024-02-28 ENCOUNTER — Other Ambulatory Visit: Payer: Self-pay | Admitting: Internal Medicine

## 2024-03-01 ENCOUNTER — Telehealth: Payer: Self-pay | Admitting: Internal Medicine

## 2024-03-01 NOTE — Telephone Encounter (Signed)
*  STAT* If patient is at the pharmacy, call can be transferred to refill team.   1. Which medications need to be refilled? (please list name of each medication and dose if known) dapagliflozin  propanediol (FARXIGA ) 10 MG TABS tablet    2. Would you like to learn more about the convenience, safety, & potential cost savings by using the [facility]?     3. Are you open to using the [facility] (Type [facility].  ).   4. Which pharmacy/location (including street and city if local pharmacy) is medication to be sent to? [employer] [place] patient assistance program [employer])   5. Do they need a 30 day or 90 day supply?90 day

## 2024-03-02 MED ORDER — DAPAGLIFLOZIN PROPANEDIOL 10 MG PO TABS: 10.0000 mg | ORAL_TABLET | Freq: Every day | ORAL | 0 refills | Status: DC

## 2024-03-02 NOTE — Telephone Encounter (Signed)
 Refill sent.

## 2024-03-07 ENCOUNTER — Telehealth: Payer: Self-pay | Admitting: Pharmacy Technician

## 2024-03-07 DIAGNOSIS — I480 Paroxysmal atrial fibrillation: Secondary | ICD-10-CM

## 2024-03-07 NOTE — Telephone Encounter (Addendum)
   Patient has cardiomyopathy. I will call patient and apply for hw grant mid December. [facility]ms sent application to patient as well

## 2024-03-15 ENCOUNTER — Other Ambulatory Visit (HOSPITAL_COMMUNITY): Payer: Self-pay

## 2024-03-15 NOTE — Addendum Note (Signed)
 Addended by: MANDA BOTTCHER B on: 03/15/2024 03:14 PM   Modules accepted: Orders

## 2024-03-15 NOTE — Telephone Encounter (Signed)
 Order has been placed for BMP. Spoke with patient and daughter advising. They will have lab work completed, before changing medication.

## 2024-03-15 NOTE — Telephone Encounter (Signed)
 Please advise if okay with transitioning Eliquis  to Pradaxa.

## 2024-03-15 NOTE — Telephone Encounter (Addendum)
 I called the patient and he said he would like to be changed to generic pradaxa. If agreeable, he would like the prescription sent to [facility] [address] [place], [place] [address] . The patient is also asking for someone to call his daughter, Heron [phone] and let her know if the eliquis  was changed to pradaxa. Thank you   He only has part a/b and supplement  I called the patient and he does not want to be changed to warfarin.  I called [employer]ms and they said if they have just have a/b insurance but no part d they get a credit to go towards that 3% that has to be met for 2026. if they have part d, they do not get that credit. if they have any type of medicare, they will have to of met the 3% in 2026 to get approved for 2026. if they have no insurance at all - no a/b, etc, they do not have to meet that 3%. she said she couldnt tell me the credit amount until the individual is processed. the apps for 2026 will be processed on or after 12/15. for hoh for 1 they can not exceed 46,950 a year, for hoh of 2 can not exceed 63,450 a year. she said all the applications that have been sent in for renewal for 2026 will be denied on 12/15 if they have any type of medicare b/c of that 3%. she said come 2026, all the prescriptions they get on cash price or goodrx amounts can be used towards that 3%. Eliquis  is 600 on goodrx at [facility]. dabigatran is 56.66 on [facility] at [facility]. xarelto 20mg  is 592 at [facility] on goodrx, only the generic xarelto is available as 2.5mg  on goodrx for 111 at [facility]. warfarin is 4.00 at [facility] and 5.[facility] [phone] a year so he does not qualify for extra help

## 2024-03-16 DIAGNOSIS — I480 Paroxysmal atrial fibrillation: Secondary | ICD-10-CM | POA: Diagnosis not present

## 2024-03-17 ENCOUNTER — Ambulatory Visit: Payer: Self-pay | Admitting: Internal Medicine

## 2024-03-17 DIAGNOSIS — R3915 Urgency of urination: Secondary | ICD-10-CM | POA: Diagnosis not present

## 2024-03-17 DIAGNOSIS — R110 Nausea: Secondary | ICD-10-CM | POA: Diagnosis not present

## 2024-03-17 DIAGNOSIS — E1122 Type 2 diabetes mellitus with diabetic chronic kidney disease: Secondary | ICD-10-CM | POA: Diagnosis not present

## 2024-03-17 DIAGNOSIS — E785 Hyperlipidemia, unspecified: Secondary | ICD-10-CM | POA: Diagnosis not present

## 2024-03-17 DIAGNOSIS — I4891 Unspecified atrial fibrillation: Secondary | ICD-10-CM | POA: Diagnosis not present

## 2024-03-17 DIAGNOSIS — N182 Chronic kidney disease, stage 2 (mild): Secondary | ICD-10-CM | POA: Diagnosis not present

## 2024-03-17 DIAGNOSIS — E1169 Type 2 diabetes mellitus with other specified complication: Secondary | ICD-10-CM | POA: Diagnosis not present

## 2024-03-17 LAB — BASIC METABOLIC PANEL WITH GFR
BUN/Creatinine Ratio: 23 (ref 10–24)
BUN: 32 mg/dL — ABNORMAL HIGH (ref 8–27)
CO2: 21 mmol/L (ref 20–29)
Calcium: 10 mg/dL (ref 8.6–10.2)
Chloride: 98 mmol/L (ref 96–106)
Creatinine, Ser: 1.4 mg/dL — ABNORMAL HIGH (ref 0.76–1.27)
Glucose: 267 mg/dL — ABNORMAL HIGH (ref 70–99)
Potassium: 5.1 mmol/L (ref 3.5–5.2)
Sodium: 139 mmol/L (ref 134–144)
eGFR: 50 mL/min/1.73 — ABNORMAL LOW (ref >59)

## 2024-03-22 ENCOUNTER — Telehealth: Payer: Self-pay | Admitting: Internal Medicine

## 2024-03-22 NOTE — Telephone Encounter (Signed)
*  STAT* If patient is at the pharmacy, call can be transferred to refill team.   1. Which medications need to be refilled? (please list name of each medication and dose if known)   dapagliflozin  propanediol (FARXIGA ) 10 MG TABS tablet      4. Which pharmacy/location (including street and city if local pharmacy) is medication to be sent to?  [facility] - [place], [place] - [address]     5. Do they need a 30 day or 90 day supply? 7 (pt daughter requesting full supply with refills if possible)

## 2024-03-22 NOTE — Telephone Encounter (Signed)
 Pt daughter calling to see what dose should he be on.

## 2024-03-23 MED ORDER — DAPAGLIFLOZIN PROPANEDIOL 10 MG PO TABS: 10.0000 mg | ORAL_TABLET | Freq: Every day | ORAL | 3 refills | Status: AC

## 2024-03-23 NOTE — Telephone Encounter (Signed)
 Refill sent

## 2024-03-24 ENCOUNTER — Encounter: Payer: Self-pay | Admitting: *Deleted

## 2024-03-24 NOTE — Telephone Encounter (Signed)
 Spoke with pt daughter, she is aware the pradaxa dose is going to be 150 BID. She reports the patient has enough to last until after the first of the first of the year. She will let us  know when to change the prescription. She also reports the patient has been approved for assistance with farxiga  but a-z and me needs a new prescription sent to them. Will forward to our patient assistance team.

## 2024-03-28 ENCOUNTER — Telehealth: Payer: Self-pay

## 2024-03-28 MED ORDER — DABIGATRAN ETEXILATE MESYLATE 150 MG PO CAPS: 150.0000 mg | ORAL_CAPSULE | Freq: Two times a day (BID) | ORAL | 3 refills | Status: AC

## 2024-03-28 NOTE — Telephone Encounter (Signed)
 Spoke with pt and let him know Dr. Wendel would like to switch him from Eliquis  to Pradaxa  150 mg twice daily. Prescription has been sent to pt preferred pharmacy. Pt denies any further questions at this time. Pt asked to have daughter called as well to explain this information to her. Pt's daughter has been made aware, no questions at this time.

## 2024-03-28 NOTE — Telephone Encounter (Signed)
-----   Message from Lurena Red, MD sent at 03/17/2024 10:35 AM EST ----- Thanks!    For the folks covering [place], please start Pradaxa  150mg  BID.  Thanks. ----- Message ----- From: Tobie Robbi POUR, [facility] Sent: 03/17/2024  10:16 AM EST To: Arun K Thukkani, MD  Patient's recent serum creatinine is 1.40 mg/dL, with an estimated creatinine clearance of 39 mL/min (actual body weight) and 36 mL/min (adjusted body weight).  Pradaxa  (dabigatran ) dose of 150 mg twice daily is appropriate for CrCl >30 mL/min, however given advance age suggest close monitoring on renal function ----- Message ----- From: Thukkani, Arun K, MD Sent: 03/17/2024   6:47 AM EST To: Ronal SHAUNNA Huron, RN; [facility]  Mr Waage needs to change to Pradaxa  for cost issues.  I checked a BMP and GFR is 50.  Would you suggest 150mg  BID or should the dose be adjusted?  Thanks, AT

## 2024-07-13 ENCOUNTER — Ambulatory Visit: Admitting: Physician Assistant
